# Patient Record
Sex: Female | Born: 1950 | Race: White | Hispanic: Yes | Marital: Married | Smoking: Never smoker
Health system: Southern US, Community
[De-identification: ages and names within clinical notes are randomized; demographics above are authoritative.]

## PROBLEM LIST (undated history)

## (undated) DIAGNOSIS — I700 Atherosclerosis of aorta: Secondary | ICD-10-CM

## (undated) DIAGNOSIS — T8859XA Other complications of anesthesia, initial encounter: Secondary | ICD-10-CM

## (undated) DIAGNOSIS — D126 Benign neoplasm of colon, unspecified: Secondary | ICD-10-CM

## (undated) DIAGNOSIS — G4733 Obstructive sleep apnea (adult) (pediatric): Secondary | ICD-10-CM

## (undated) DIAGNOSIS — G5600 Carpal tunnel syndrome, unspecified upper limb: Secondary | ICD-10-CM

## (undated) DIAGNOSIS — Z9889 Other specified postprocedural states: Secondary | ICD-10-CM

## (undated) DIAGNOSIS — I10 Essential (primary) hypertension: Secondary | ICD-10-CM

## (undated) DIAGNOSIS — H40053 Ocular hypertension, bilateral: Secondary | ICD-10-CM

## (undated) DIAGNOSIS — K219 Gastro-esophageal reflux disease without esophagitis: Secondary | ICD-10-CM

## (undated) DIAGNOSIS — Z9641 Presence of insulin pump (external) (internal): Secondary | ICD-10-CM

## (undated) DIAGNOSIS — IMO0001 Reserved for inherently not codable concepts without codable children: Secondary | ICD-10-CM

## (undated) DIAGNOSIS — E039 Hypothyroidism, unspecified: Secondary | ICD-10-CM

## (undated) DIAGNOSIS — H409 Unspecified glaucoma: Secondary | ICD-10-CM

## (undated) DIAGNOSIS — G459 Transient cerebral ischemic attack, unspecified: Secondary | ICD-10-CM

## (undated) DIAGNOSIS — I639 Cerebral infarction, unspecified: Secondary | ICD-10-CM

## (undated) DIAGNOSIS — E109 Type 1 diabetes mellitus without complications: Secondary | ICD-10-CM

## (undated) DIAGNOSIS — R0609 Other forms of dyspnea: Secondary | ICD-10-CM

## (undated) DIAGNOSIS — I2510 Atherosclerotic heart disease of native coronary artery without angina pectoris: Secondary | ICD-10-CM

## (undated) DIAGNOSIS — Z531 Procedure and treatment not carried out because of patient's decision for reasons of belief and group pressure: Secondary | ICD-10-CM

## (undated) DIAGNOSIS — R112 Nausea with vomiting, unspecified: Secondary | ICD-10-CM

## (undated) HISTORY — PX: BLADDER SURGERY: SHX569

## (undated) HISTORY — PX: NO PAST SURGERIES: SHX2092

## (undated) HISTORY — PX: ANAL FISSURE REPAIR: SHX2312

## (undated) HISTORY — PX: COLONOSCOPY: SHX174

---

## 2004-04-20 ENCOUNTER — Ambulatory Visit: Payer: Self-pay | Admitting: Internal Medicine

## 2004-04-21 ENCOUNTER — Ambulatory Visit: Payer: Self-pay | Admitting: Unknown Physician Specialty

## 2004-08-31 ENCOUNTER — Ambulatory Visit: Payer: Self-pay | Admitting: Internal Medicine

## 2004-11-02 ENCOUNTER — Ambulatory Visit: Payer: Self-pay | Admitting: Specialist

## 2004-11-09 ENCOUNTER — Ambulatory Visit: Payer: Self-pay | Admitting: Unknown Physician Specialty

## 2004-12-24 ENCOUNTER — Ambulatory Visit: Payer: Self-pay | Admitting: Unknown Physician Specialty

## 2005-06-05 ENCOUNTER — Other Ambulatory Visit: Payer: Self-pay

## 2005-06-12 ENCOUNTER — Ambulatory Visit: Payer: Self-pay | Admitting: Specialist

## 2005-10-01 ENCOUNTER — Ambulatory Visit: Payer: Self-pay | Admitting: Family Medicine

## 2005-10-30 ENCOUNTER — Ambulatory Visit: Payer: Self-pay | Admitting: Specialist

## 2006-05-03 ENCOUNTER — Ambulatory Visit: Payer: Self-pay | Admitting: Unknown Physician Specialty

## 2006-05-07 ENCOUNTER — Ambulatory Visit: Payer: Self-pay | Admitting: Unknown Physician Specialty

## 2006-05-25 ENCOUNTER — Ambulatory Visit: Payer: Self-pay | Admitting: Unknown Physician Specialty

## 2006-06-11 ENCOUNTER — Ambulatory Visit: Payer: Self-pay | Admitting: Internal Medicine

## 2006-10-08 ENCOUNTER — Ambulatory Visit: Payer: Self-pay | Admitting: Unknown Physician Specialty

## 2006-10-25 ENCOUNTER — Ambulatory Visit: Payer: Self-pay | Admitting: Unknown Physician Specialty

## 2006-12-20 ENCOUNTER — Ambulatory Visit: Payer: Self-pay | Admitting: Unknown Physician Specialty

## 2006-12-23 ENCOUNTER — Ambulatory Visit: Payer: Self-pay | Admitting: Nurse Practitioner

## 2006-12-25 ENCOUNTER — Ambulatory Visit: Payer: Self-pay | Admitting: Unknown Physician Specialty

## 2007-01-29 ENCOUNTER — Ambulatory Visit: Payer: Self-pay | Admitting: Unknown Physician Specialty

## 2007-02-24 ENCOUNTER — Ambulatory Visit: Payer: Self-pay | Admitting: Unknown Physician Specialty

## 2007-08-04 ENCOUNTER — Ambulatory Visit: Payer: Self-pay | Admitting: Unknown Physician Specialty

## 2007-08-25 ENCOUNTER — Ambulatory Visit: Payer: Self-pay | Admitting: Unknown Physician Specialty

## 2008-01-01 ENCOUNTER — Ambulatory Visit: Payer: Self-pay | Admitting: Family Medicine

## 2008-09-21 ENCOUNTER — Ambulatory Visit: Payer: Self-pay | Admitting: Specialist

## 2009-01-03 ENCOUNTER — Ambulatory Visit: Payer: Self-pay | Admitting: Family Medicine

## 2009-08-15 ENCOUNTER — Ambulatory Visit: Payer: Self-pay | Admitting: Unknown Physician Specialty

## 2009-12-22 ENCOUNTER — Ambulatory Visit: Payer: Self-pay | Admitting: Cardiovascular Disease

## 2010-01-05 ENCOUNTER — Ambulatory Visit: Payer: Self-pay | Admitting: Internal Medicine

## 2010-03-12 ENCOUNTER — Ambulatory Visit: Payer: Self-pay | Admitting: Internal Medicine

## 2010-05-04 ENCOUNTER — Ambulatory Visit: Payer: Self-pay | Admitting: Unknown Physician Specialty

## 2011-01-29 ENCOUNTER — Ambulatory Visit: Payer: Self-pay | Admitting: Internal Medicine

## 2011-03-02 DIAGNOSIS — E039 Hypothyroidism, unspecified: Secondary | ICD-10-CM | POA: Insufficient documentation

## 2011-12-26 ENCOUNTER — Ambulatory Visit: Payer: Self-pay | Admitting: Internal Medicine

## 2012-01-30 ENCOUNTER — Ambulatory Visit: Payer: Self-pay | Admitting: Internal Medicine

## 2012-09-16 ENCOUNTER — Ambulatory Visit: Payer: Self-pay | Admitting: Internal Medicine

## 2013-12-23 ENCOUNTER — Ambulatory Visit: Payer: Self-pay | Admitting: Internal Medicine

## 2014-01-28 ENCOUNTER — Ambulatory Visit: Payer: Self-pay

## 2014-01-28 LAB — URINALYSIS, COMPLETE
BLOOD: NEGATIVE
Bilirubin,UR: NEGATIVE
GLUCOSE, UR: NEGATIVE
KETONE: NEGATIVE
Nitrite: NEGATIVE
Ph: 7.5 (ref 5.0–8.0)
Protein: NEGATIVE
SPECIFIC GRAVITY: 1.015 (ref 1.000–1.030)

## 2014-01-30 LAB — URINE CULTURE

## 2014-08-02 DIAGNOSIS — G4733 Obstructive sleep apnea (adult) (pediatric): Secondary | ICD-10-CM | POA: Insufficient documentation

## 2014-09-29 DIAGNOSIS — Z531 Procedure and treatment not carried out because of patient's decision for reasons of belief and group pressure: Secondary | ICD-10-CM | POA: Insufficient documentation

## 2015-01-04 ENCOUNTER — Other Ambulatory Visit: Payer: Self-pay | Admitting: Internal Medicine

## 2015-01-04 ENCOUNTER — Other Ambulatory Visit: Payer: Self-pay | Admitting: Family Medicine

## 2015-01-04 DIAGNOSIS — Z1231 Encounter for screening mammogram for malignant neoplasm of breast: Secondary | ICD-10-CM

## 2015-01-06 ENCOUNTER — Ambulatory Visit
Admission: RE | Admit: 2015-01-06 | Discharge: 2015-01-06 | Disposition: A | Discharge status: Home / Self Care | Payer: Managed Care, Other (non HMO) | Source: Ambulatory Visit | Attending: Family Medicine | Admitting: Family Medicine

## 2015-01-06 ENCOUNTER — Ambulatory Visit: Payer: Self-pay

## 2015-01-06 DIAGNOSIS — Z1231 Encounter for screening mammogram for malignant neoplasm of breast: Secondary | ICD-10-CM | POA: Diagnosis not present

## 2015-05-09 ENCOUNTER — Other Ambulatory Visit: Payer: Self-pay | Admitting: Family Medicine

## 2015-05-09 DIAGNOSIS — G8929 Other chronic pain: Secondary | ICD-10-CM

## 2015-05-09 DIAGNOSIS — M546 Pain in thoracic spine: Principal | ICD-10-CM

## 2015-12-20 ENCOUNTER — Ambulatory Visit
Admission: EM | Admit: 2015-12-20 | Discharge: 2015-12-20 | Disposition: A | Discharge status: Home / Self Care | Payer: Managed Care, Other (non HMO) | Attending: Family Medicine | Admitting: Family Medicine

## 2015-12-20 DIAGNOSIS — N390 Urinary tract infection, site not specified: Secondary | ICD-10-CM

## 2015-12-20 HISTORY — DX: Essential (primary) hypertension: I10

## 2015-12-20 HISTORY — DX: Type 1 diabetes mellitus without complications: E10.9

## 2015-12-20 HISTORY — DX: Hypothyroidism, unspecified: E03.9

## 2015-12-20 LAB — URINALYSIS COMPLETE WITH MICROSCOPIC (ARMC ONLY)
BILIRUBIN URINE: NEGATIVE
Glucose, UA: 500 mg/dL — AB
KETONES UR: NEGATIVE mg/dL
NITRITE: NEGATIVE
Protein, ur: NEGATIVE mg/dL
SQUAMOUS EPITHELIAL / LPF: NONE SEEN
Specific Gravity, Urine: 1.015 (ref 1.005–1.030)
pH: 5.5 (ref 5.0–8.0)

## 2015-12-20 MED ORDER — CIPROFLOXACIN HCL 500 MG PO TABS: 500.0000 mg | ORAL_TABLET | Freq: Two times a day (BID) | ORAL | 0 refills | Status: DC

## 2015-12-20 NOTE — ED Triage Notes (Signed)
Patient reports that she had a UTI on 11/28/2015 and was treated with Macrobid. Patient reports that she had improved but worsened again 2 days ago. Reports that her culture grew out E. Coli in her urine. Patient states that she usually responds well to Cipro.

## 2015-12-20 NOTE — ED Provider Notes (Signed)
[facility]    CSN: [id number] Arrival date & time: 12/20/15  0946     History   Chief Complaint Chief Complaint  Patient presents with  . Urinary Tract Infection    HPI Cheyenne Lopez is a 65 y.o. female.   The history is provided by the patient.  Dysuria  Pain quality:  Burning Pain severity:  Moderate Onset quality:  Sudden Duration:  2 days Timing:  Constant Progression:  Worsening Chronicity:  New Recent urinary tract infections: yes   Relieved by:  None tried Ineffective treatments:  None tried Urinary symptoms: frequent urination   Urinary symptoms: no discolored urine, no foul-smelling urine, no hematuria, no hesitancy and no bladder incontinence   Associated symptoms: no abdominal pain, no fever, no flank pain, no genital lesions, no nausea, no vaginal discharge and no vomiting   Risk factors: recurrent urinary tract infections   Risk factors: no hx of pyelonephritis, no hx of urolithiasis, no kidney transplant, not pregnant, no renal cysts, no renal disease, no sexually transmitted infections, no single kidney and no urinary catheter     Past Medical History:  Diagnosis Date  . Hypertension   . Hypothyroid   . Type 1 diabetes ([place])     There are no active problems to display for this patient.   Past Surgical History:  Procedure Laterality Date  . BLADDER SURGERY    . NO PAST SURGERIES      OB History    No data available       Home Medications    Prior to Admission medications   Medication Sig Start Date End Date Taking? Authorizing Provider  aspirin EC 81 MG tablet Take 81 mg by mouth daily.   Yes Historical Provider, MD  Insulin Human (INSULIN PUMP) SOLN Inject into the skin.   Yes Historical Provider, MD  insulin lispro (HUMALOG) 100 UNIT/ML injection Inject into the skin 3 (three) times daily before meals.   Yes Historical Provider, MD  latanoprost (XALATAN) 0.005 % ophthalmic solution 1 drop at bedtime.   Yes Historical  Provider, MD  losartan (COZAAR) 25 MG tablet Take 25 mg by mouth daily.   Yes Historical Provider, MD  thyroid (ARMOUR) 65 MG tablet Take 65 mg by mouth daily.   Yes Historical Provider, MD  ciprofloxacin (CIPRO) 500 MG tablet Take 1 tablet (500 mg total) by mouth every 12 (twelve) hours. 12/20/15   Norval Gable, MD    Family History History reviewed. No pertinent family history.  Social History Social History  Substance Use Topics  . Smoking status: Never Smoker  . Smokeless tobacco: Never Used  . Alcohol use No     Allergies   Review of patient's allergies indicates no known allergies.   Review of Systems Review of Systems  Constitutional: Negative for fever.  Gastrointestinal: Negative for abdominal pain, nausea and vomiting.  Genitourinary: Positive for dysuria. Negative for flank pain and vaginal discharge.     Physical Exam Triage Vital Signs ED Triage Vitals  Enc Vitals Group     BP 12/20/15 1111 (!) 124/55     Pulse Rate 12/20/15 1111 (!) 51     Resp 12/20/15 1111 16     Temp 12/20/15 1111 97.8 F (36.6 C)     Temp Source 12/20/15 1111 Tympanic     SpO2 12/20/15 1111 100 %     Weight 12/20/15 1113 155 lb (70.3 kg)     Height 12/20/15 1113 5\' 1"  (1.549 m)  Head Circumference --      Peak Flow --      Pain Score 12/20/15 1118 7     Pain Loc --      Pain Edu? --      Excl. in [place]? --    No data found.   Updated Vital Signs BP (!) 124/55 (BP Location: Left Arm)   Pulse (!) 51   Temp 97.8 F (36.6 C) (Tympanic)   Resp 16   Ht 5\' 1"  (1.549 m)   Wt 155 lb (70.3 kg)   SpO2 100%   BMI 29.29 kg/m   Visual Acuity Right Eye Distance:   Left Eye Distance:   Bilateral Distance:    Right Eye Near:   Left Eye Near:    Bilateral Near:     Physical Exam  Constitutional: She appears well-developed and well-nourished. No distress.  Abdominal: Soft. Bowel sounds are normal. She exhibits no distension and no mass. There is tenderness (mild suprapubic).  There is no rebound and no guarding.  Skin: She is not diaphoretic.  Nursing note and vitals reviewed.    UC Treatments / Results  Labs (all labs ordered are listed, but only abnormal results are displayed) Labs Reviewed  URINALYSIS COMPLETEWITH MICROSCOPIC ([place]) - Abnormal; Notable for the following:       Result Value   APPearance HAZY (*)    Glucose, UA 500 (*)    Hgb urine dipstick MODERATE (*)    Leukocytes, UA LARGE (*)    Bacteria, UA MANY (*)    All other components within normal limits    EKG  EKG Interpretation None       Radiology No results found.  Procedures Procedures (including critical care time)  Medications Ordered in UC Medications - No data to display   Initial Impression / Assessment and Plan / UC Course  I have reviewed the triage vital signs and the nursing notes.  Pertinent labs & imaging results that were available during my care of the patient were reviewed by me and considered in my medical decision making (see chart for details).  Clinical Course      Final Clinical Impressions(s) / UC Diagnoses   Final diagnoses:  UTI (lower urinary tract infection)    New Prescriptions Discharge Medication List as of 12/20/2015 11:43 AM    START taking these medications   Details  ciprofloxacin (CIPRO) 500 MG tablet Take 1 tablet (500 mg total) by mouth every 12 (twelve) hours., Starting Tue 12/20/2015, Normal       1. Lab results and diagnosis reviewed with patient 2. rx as per orders above; reviewed possible side effects, interactions, risks and benefits  3. Recommend supportive treatment with increased water 4. Follow-up prn if symptoms worsen or don't improve   Norval Gable, MD 12/20/15 1202

## 2015-12-24 ENCOUNTER — Telehealth: Payer: Self-pay

## 2015-12-24 NOTE — Telephone Encounter (Signed)
Courtesy call back completed today after patient's visit at [facility]. Patient improved and will call back with any questions or concerns.  

## 2016-01-31 ENCOUNTER — Other Ambulatory Visit: Payer: Self-pay | Admitting: Family Medicine

## 2016-01-31 DIAGNOSIS — Z1231 Encounter for screening mammogram for malignant neoplasm of breast: Secondary | ICD-10-CM

## 2016-02-14 ENCOUNTER — Ambulatory Visit
Admission: RE | Admit: 2016-02-14 | Discharge: 2016-02-14 | Disposition: A | Discharge status: Home / Self Care | Payer: Managed Care, Other (non HMO) | Source: Ambulatory Visit | Attending: Family Medicine | Admitting: Family Medicine

## 2016-02-14 DIAGNOSIS — Z1231 Encounter for screening mammogram for malignant neoplasm of breast: Secondary | ICD-10-CM | POA: Diagnosis not present

## 2016-05-01 DIAGNOSIS — M7710 Lateral epicondylitis, unspecified elbow: Secondary | ICD-10-CM | POA: Insufficient documentation

## 2016-08-16 ENCOUNTER — Encounter: Payer: Self-pay | Admitting: Emergency Medicine

## 2016-08-16 ENCOUNTER — Ambulatory Visit
Admission: EM | Admit: 2016-08-16 | Discharge: 2016-08-16 | Disposition: A | Discharge status: Home / Self Care | Payer: 59 | Attending: Emergency Medicine | Admitting: Emergency Medicine

## 2016-08-16 DIAGNOSIS — N3000 Acute cystitis without hematuria: Secondary | ICD-10-CM | POA: Diagnosis not present

## 2016-08-16 LAB — URINALYSIS, COMPLETE (UACMP) WITH MICROSCOPIC
Bilirubin Urine: NEGATIVE
GLUCOSE, UA: 100 mg/dL — AB
Ketones, ur: NEGATIVE mg/dL
Nitrite: NEGATIVE
PH: 5.5 (ref 5.0–8.0)

## 2016-08-16 MED ORDER — CIPROFLOXACIN HCL 500 MG PO TABS: 500.0000 mg | ORAL_TABLET | Freq: Two times a day (BID) | ORAL | 0 refills | Status: DC

## 2016-08-16 NOTE — ED Triage Notes (Signed)
Burning and frequent urination started this morning

## 2016-08-16 NOTE — ED Provider Notes (Signed)
CSN: [id number]     Arrival date & time 08/16/16  1936 History   None    Chief Complaint  Patient presents with  . Urinary Frequency   (Consider location/radiation/quality/duration/timing/severity/associated sxs/prior Treatment) HPI Patient presents today for evaluation of a 1 day history of burning with urination.  PMH involved DM1 with insulin pump and history of UTI 6 months ago.  Reports irritation and burning with urination today.  Denies any bloody urine or discharge.  No change in sexual partners.  No fevers at home, no abdominal pain.  Mild right sided low back pain.  Was treated with Cipro 6 months ago with complete resolution of symptoms. Past Medical History:  Diagnosis Date  . Hypertension   . Hypothyroid   . Type 1 diabetes [facility]    Past Surgical History:  Procedure Laterality Date  . BLADDER SURGERY    . NO PAST SURGERIES     Family History  Problem Relation Age of Onset  . Breast cancer Neg Hx    Social History  Substance Use Topics  . Smoking status: Never Smoker  . Smokeless tobacco: Never Used  . Alcohol use No   OB History    No data available     Review of Systems  Constitutional: Positive for fatigue. Negative for fever.  HENT: Negative.   Eyes: Negative.   Respiratory: Negative.   Cardiovascular: Negative.   Gastrointestinal: Negative for abdominal pain.  Endocrine: Negative.   Genitourinary: Positive for dysuria, flank pain and frequency.  Musculoskeletal: Negative for arthralgias, back pain, gait problem, joint swelling, myalgias, neck pain and neck stiffness.  Skin: Negative.   Allergic/Immunologic: Negative.   Neurological: Negative.   Hematological: Negative.   Psychiatric/Behavioral: Negative.     Allergies  Patient has no known allergies.  Home Medications   Prior to Admission medications   Medication Sig Start Date End Date Taking? Authorizing Provider  aspirin EC 81 MG tablet Take 81 mg by mouth daily.   Yes [provider]  Insulin Human (INSULIN PUMP) SOLN Inject into the skin.   Yes [provider]  insulin lispro (HUMALOG) 100 UNIT/ML injection Inject into the skin 3 (three) times daily before meals.   Yes [provider]  latanoprost (XALATAN) 0.005 % ophthalmic solution 1 drop at bedtime.   Yes [provider]  losartan (COZAAR) 25 MG tablet Take 25 mg by mouth daily.   Yes [provider]  thyroid (ARMOUR) 65 MG tablet Take 65 mg by mouth daily.   Yes [provider]  ciprofloxacin (CIPRO) 500 MG tablet Take 1 tablet (500 mg total) by mouth every 12 (twelve) hours. 08/16/16   Lattie Corns, PA-C   Meds Ordered and Administered this Visit  Medications - No data to display  BP 129/62 (BP Location: Right Arm)   Pulse 66   Temp 97.9 F (36.6 C) (Oral)   Resp 16   Ht 5' (1.524 m)   Wt 150 lb (68 kg)   SpO2 99%   BMI 29.29 kg/m  No data found.  Physical Exam  Constitutional: She appears well-developed and well-nourished.  Cardiovascular: Normal rate and regular rhythm.   Pulmonary/Chest: Effort normal and breath sounds normal. No respiratory distress. She has no wheezes. She has no rales. She exhibits no tenderness.  Abdominal: Soft. Bowel sounds are normal. She exhibits no distension. There is no tenderness.  Genitourinary:  Genitourinary Comments: Mild CVA tenderness on right lumbar spine region    Urgent Care  Course     Procedures (including critical care time)  Labs Review Labs Reviewed  URINALYSIS, COMPLETE (UACMP) WITH MICROSCOPIC - Abnormal; Notable for the following:       Result Value   APPearance CLOUDY (*)    Specific Gravity, Urine >1.030 (*)    Glucose, UA 100 (*)    Hgb urine dipstick LARGE (*)    Protein, ur TRACE (*)    Leukocytes, UA MODERATE (*)    Squamous Epithelial / LPF 0-5 (*)    Bacteria, UA MANY (*)    All other components within normal limits  URINE CULTURE   Imaging Review No results  found.  MDM   1. Acute cystitis without hematuria   -  Treatment options were discussed today with the patient. -  Will place on Cipro x 7 days.  Urine Cx ordered. -  Symptomatic treatment encouraged. -  Follow-up with PCP or [facility] if symptoms fail to improve or worsen.    Lattie Corns, PA-C 08/16/16 2015

## 2016-08-19 LAB — URINE CULTURE: Culture: >=100000 — AB

## 2017-01-09 ENCOUNTER — Other Ambulatory Visit: Payer: Self-pay | Admitting: Family Medicine

## 2017-01-22 ENCOUNTER — Other Ambulatory Visit: Payer: Self-pay | Admitting: Family Medicine

## 2017-01-22 DIAGNOSIS — Z1239 Encounter for other screening for malignant neoplasm of breast: Secondary | ICD-10-CM

## 2017-01-24 ENCOUNTER — Other Ambulatory Visit: Payer: Self-pay | Admitting: Family Medicine

## 2017-01-24 DIAGNOSIS — Z780 Asymptomatic menopausal state: Secondary | ICD-10-CM

## 2017-02-12 ENCOUNTER — Ambulatory Visit
Admission: RE | Admit: 2017-02-12 | Discharge: 2017-02-12 | Disposition: A | Discharge status: Home / Self Care | Payer: 59 | Source: Ambulatory Visit | Attending: Family Medicine | Admitting: Family Medicine

## 2017-02-12 DIAGNOSIS — Z1231 Encounter for screening mammogram for malignant neoplasm of breast: Secondary | ICD-10-CM | POA: Diagnosis not present

## 2017-02-12 DIAGNOSIS — Z1239 Encounter for other screening for malignant neoplasm of breast: Secondary | ICD-10-CM

## 2017-11-03 ENCOUNTER — Other Ambulatory Visit: Payer: Self-pay

## 2017-11-03 ENCOUNTER — Ambulatory Visit
Admission: EM | Admit: 2017-11-03 | Discharge: 2017-11-03 | Disposition: A | Discharge status: Home / Self Care | Payer: Medicare Other | Attending: Family Medicine | Admitting: Family Medicine

## 2017-11-03 DIAGNOSIS — R300 Dysuria: Secondary | ICD-10-CM

## 2017-11-03 DIAGNOSIS — N3000 Acute cystitis without hematuria: Secondary | ICD-10-CM

## 2017-11-03 DIAGNOSIS — R350 Frequency of micturition: Secondary | ICD-10-CM | POA: Diagnosis not present

## 2017-11-03 LAB — URINALYSIS, COMPLETE (UACMP) WITH MICROSCOPIC
BILIRUBIN URINE: NEGATIVE
Bacteria, UA: NONE SEEN
Glucose, UA: NEGATIVE mg/dL
HGB URINE DIPSTICK: NEGATIVE
Ketones, ur: NEGATIVE mg/dL
NITRITE: NEGATIVE
PROTEIN: NEGATIVE mg/dL
RBC / HPF: NONE SEEN RBC/hpf (ref 0–5)
Specific Gravity, Urine: 1.015 (ref 1.005–1.030)
pH: 7.5 (ref 5.0–8.0)

## 2017-11-03 MED ORDER — CIPROFLOXACIN HCL 500 MG PO TABS: 500.0000 mg | ORAL_TABLET | Freq: Two times a day (BID) | ORAL | 0 refills | Status: DC

## 2017-11-03 NOTE — ED Triage Notes (Signed)
Pt with urinary frequency starting early in the week, having some right flank pain and urinary hesitency.

## 2017-11-03 NOTE — ED Provider Notes (Signed)
[facility]    CSN: [id number] Arrival date & time: 11/03/17  1253     History   Chief Complaint Chief Complaint  Patient presents with  . Urinary Frequency    HPI Cheyenne Lopez is a 67 y.o. female.   67 yo female with a c/o urinary frequency and dysuria for one week. States she's been treating it at home with cranberry pills and increased water intake and has relieved some, however she started having right flank pain x 2 days. Denies any fevers, chills, vomiting.   The history is provided by the patient.  Urinary Frequency     Past Medical History:  Diagnosis Date  . Hypertension   . Hypothyroid   . Type 1 diabetes ([place])     There are no active problems to display for this patient.   Past Surgical History:  Procedure Laterality Date  . BLADDER SURGERY    . NO PAST SURGERIES      OB History   None      Home Medications    Prior to Admission medications   Medication Sig Start Date End Date Taking? Authorizing Provider  insulin glargine (LANTUS) 100 UNIT/ML injection Lantus U-100 Insulin 100 unit/mL subcutaneous solution 09/28/15  Yes [provider]  aspirin EC 81 MG tablet Take 81 mg by mouth daily.    [provider]  ciprofloxacin (CIPRO) 500 MG tablet Take 1 tablet (500 mg total) by mouth every 12 (twelve) hours. 11/03/17   Norval Gable, MD  Insulin Human (INSULIN PUMP) SOLN Inject into the skin.    [provider]  insulin lispro (HUMALOG) 100 UNIT/ML injection Inject into the skin 3 (three) times daily before meals.    [provider]  latanoprost (XALATAN) 0.005 % ophthalmic solution 1 drop at bedtime.    [provider]  losartan (COZAAR) 25 MG tablet Take 25 mg by mouth daily.    [provider]  ranitidine (ZANTAC) 300 MG tablet  08/22/17   [provider]  thyroid (ARMOUR) 65 MG tablet Take 65 mg by mouth daily.    [provider]  valsartan (DIOVAN) 40 MG  tablet Take 20 mg by mouth daily.  10/27/17   [provider]    Family History Family History  Problem Relation Age of Onset  . Breast cancer Neg Hx     Social History Social History   Tobacco Use  . Smoking status: Never Smoker  . Smokeless tobacco: Never Used  Substance Use Topics  . Alcohol use: No  . Drug use: No     Allergies   Patient has no known allergies.   Review of Systems Review of Systems  Genitourinary: Positive for frequency.     Physical Exam Triage Vital Signs ED Triage Vitals  Enc Vitals Group     BP 11/03/17 1308 (!) 134/57     Pulse Rate 11/03/17 1308 68     Resp 11/03/17 1308 16     Temp 11/03/17 1308 98.1 F (36.7 C)     Temp Source 11/03/17 1308 Oral     SpO2 11/03/17 1308 100 %     Weight 11/03/17 1305 146 lb (66.2 kg)     Height 11/03/17 1305 5' (1.524 m)     Head Circumference --      Peak Flow --      Pain Score 11/03/17 1305 6     Pain Loc --      Pain Edu? --  Excl. in [place]? --    No data found.  Updated Vital Signs BP (!) 134/57 (BP Location: Left Arm)   Pulse 68   Temp 98.1 F (36.7 C) (Oral)   Resp 16   Ht 5' (1.524 m)   Wt 66.2 kg   SpO2 100%   BMI 28.51 kg/m   Visual Acuity Right Eye Distance:   Left Eye Distance:   Bilateral Distance:    Right Eye Near:   Left Eye Near:    Bilateral Near:     Physical Exam  Constitutional: She appears well-developed and well-nourished. No distress.  Abdominal: Soft. Bowel sounds are normal. She exhibits no distension and no mass. There is tenderness (mild, right flank). There is no rebound and no guarding.  Skin: She is not diaphoretic.  Nursing note and vitals reviewed.    UC Treatments / Results  Labs (all labs ordered are listed, but only abnormal results are displayed) Labs Reviewed  URINALYSIS, COMPLETE (UACMP) WITH MICROSCOPIC - Abnormal; Notable for the following components:      Result Value   Leukocytes, UA TRACE (*)    All other components  within normal limits  URINE CULTURE    EKG None  Radiology No results found.  Procedures Procedures (including critical care time)  Medications Ordered in UC Medications - No data to display  Initial Impression / Assessment and Plan / UC Course  I have reviewed the triage vital signs and the nursing notes.  Pertinent labs & imaging results that were available during my care of the patient were reviewed by me and considered in my medical decision making (see chart for details).      Final Clinical Impressions(s) / UC Diagnoses   Final diagnoses:  Acute cystitis without hematuria   Discharge Instructions   None    ED Prescriptions    Medication Sig Dispense Auth. Provider   ciprofloxacin (CIPRO) 500 MG tablet Take 1 tablet (500 mg total) by mouth every 12 (twelve) hours. 10 tablet Norval Gable, MD     1. Lab results and diagnosis reviewed with patient 2. rx as per orders above; reviewed possible side effects, interactions, risks and benefits  3. Recommend supportive treatment with increased fluids 4. Follow-up prn if symptoms worsen or don't improve   Controlled Substance Prescriptions [place] Controlled Substance Registry consulted? Not Applicable   Norval Gable, MD 11/03/17 1425

## 2017-11-05 LAB — URINE CULTURE
Culture: NO GROWTH
SPECIAL REQUESTS: NORMAL

## 2018-01-06 ENCOUNTER — Other Ambulatory Visit: Payer: Self-pay | Admitting: Family Medicine

## 2018-01-06 DIAGNOSIS — Z1231 Encounter for screening mammogram for malignant neoplasm of breast: Secondary | ICD-10-CM

## 2018-02-25 ENCOUNTER — Ambulatory Visit
Admission: RE | Admit: 2018-02-25 | Discharge: 2018-02-25 | Disposition: A | Discharge status: Home / Self Care | Payer: 59 | Source: Ambulatory Visit | Attending: Family Medicine | Admitting: Family Medicine

## 2018-02-25 ENCOUNTER — Encounter (INDEPENDENT_AMBULATORY_CARE_PROVIDER_SITE_OTHER): Payer: Self-pay

## 2018-02-25 DIAGNOSIS — Z1231 Encounter for screening mammogram for malignant neoplasm of breast: Secondary | ICD-10-CM | POA: Insufficient documentation

## 2019-02-07 ENCOUNTER — Other Ambulatory Visit: Payer: Self-pay

## 2019-02-07 ENCOUNTER — Encounter: Payer: Self-pay | Admitting: Emergency Medicine

## 2019-02-07 ENCOUNTER — Ambulatory Visit
Admission: EM | Admit: 2019-02-07 | Discharge: 2019-02-07 | Disposition: A | Discharge status: Home / Self Care | Payer: 59 | Attending: Emergency Medicine | Admitting: Emergency Medicine

## 2019-02-07 ENCOUNTER — Ambulatory Visit (INDEPENDENT_AMBULATORY_CARE_PROVIDER_SITE_OTHER): Payer: 59

## 2019-02-07 DIAGNOSIS — M7061 Trochanteric bursitis, right hip: Secondary | ICD-10-CM

## 2019-02-07 MED ORDER — KETOROLAC TROMETHAMINE 60 MG/2ML IM SOLN
30.0000 mg | Freq: Once | INTRAMUSCULAR | Status: AC
  Administered 2019-02-07: 30 mg via INTRAMUSCULAR

## 2019-02-07 MED ORDER — MELOXICAM 7.5 MG PO TABS: 7.5000 mg | ORAL_TABLET | Freq: Every day | ORAL | 0 refills | Status: DC

## 2019-02-07 NOTE — ED Triage Notes (Signed)
Patient c/o pain that starts in her upper part of her right leg and runs down her right leg since Thursday.  Patient denies any injury or fall.

## 2019-02-07 NOTE — ED Provider Notes (Signed)
[facility]    CSN: [id number] Arrival date & time: 02/07/19  1454      History   Chief Complaint Chief Complaint  Patient presents with  . Leg Pain    right    HPI Cheyenne Lopez is a 68 y.o. female.   HPI  68 year old female type I diabetic Presents with a right-sided leg pain that started suddenly on Thursday 2 days prior to this visit.  States that anytime she stands or puts weight on that leg that it hurts severely.  She states that the pain is felt mostly in the lateral hip at the greater trochanter particularly posteriorly and will radiate into her right leg to the level of her ankle.  She has been using a cane for ambulation assistance.  She has no back pain.  She does not remember any injury to her leg at all.  Sitting and standing are very painful.        Past Medical History:  Diagnosis Date  . Hypertension   . Hypothyroid   . Type 1 diabetes ([place])     There are no active problems to display for this patient.   Past Surgical History:  Procedure Laterality Date  . BLADDER SURGERY    . NO PAST SURGERIES      OB History   No obstetric history on file.      Home Medications    Prior to Admission medications   Medication Sig Start Date End Date Taking? Authorizing Provider  aspirin EC 81 MG tablet Take 81 mg by mouth daily.   Yes [provider]  Insulin Human (INSULIN PUMP) SOLN Inject into the skin.   Yes [provider]  insulin lispro (HUMALOG) 100 UNIT/ML injection Inject into the skin 3 (three) times daily before meals.   Yes [provider]  thyroid (ARMOUR) 65 MG tablet Take 65 mg by mouth daily.   Yes [provider]  valsartan (DIOVAN) 40 MG tablet Take 20 mg by mouth daily.  10/27/17  Yes [provider]  insulin glargine (LANTUS) 100 UNIT/ML injection Lantus U-100 Insulin 100 unit/mL subcutaneous solution 09/28/15   [provider]  latanoprost (XALATAN) 0.005 %  ophthalmic solution 1 drop at bedtime.    [provider]  meloxicam (MOBIC) 7.5 MG tablet Take 1 tablet (7.5 mg total) by mouth daily. 02/07/19   Lorin Picket, PA-C  losartan (COZAAR) 25 MG tablet Take 25 mg by mouth daily.  02/07/19  [provider]  ranitidine (ZANTAC) 300 MG tablet  08/22/17 02/07/19  [provider]    Family History Family History  Problem Relation Age of Onset  . Breast cancer Neg Hx     Social History Social History   Tobacco Use  . Smoking status: Never Smoker  . Smokeless tobacco: Never Used  Substance Use Topics  . Alcohol use: No  . Drug use: No     Allergies   Patient has no known allergies.   Review of Systems Review of Systems  Constitutional: Positive for activity change. Negative for appetite change, chills and fatigue.  Musculoskeletal: Positive for gait problem and myalgias.  All other systems reviewed and are negative.    Physical Exam Triage Vital Signs ED Triage Vitals  Enc Vitals Group     BP 02/07/19 1518 (!) 145/65     Pulse Rate 02/07/19 1518 88     Resp 02/07/19 1518 16     Temp 02/07/19 1518 99.8  F (37.7 C)     Temp Source 02/07/19 1518 Oral     SpO2 02/07/19 1518 100 %     Weight 02/07/19 1517 153 lb (69.4 kg)     Height 02/07/19 1517 5\' 1"  (1.549 m)     Head Circumference --      Peak Flow --      Pain Score 02/07/19 1517 9     Pain Loc --      Pain Edu? --      Excl. in [place]? --    No data found.  Updated Vital Signs BP (!) 145/65 (BP Location: Left Arm)   Pulse 88   Temp 99.8 F (37.7 C) (Oral)   Resp 16   Ht 5\' 1"  (1.549 m)   Wt 153 lb (69.4 kg)   SpO2 100%   BMI 28.91 kg/m   Visual Acuity Right Eye Distance:   Left Eye Distance:   Bilateral Distance:    Right Eye Near:   Left Eye Near:    Bilateral Near:     Physical Exam Vitals signs and nursing note reviewed.  Constitutional:      General: She is not in acute distress.    Appearance: Normal appearance.  She is normal weight. She is not ill-appearing, toxic-appearing or diaphoretic.  HENT:     Head: Normocephalic and atraumatic.     Mouth/Throat:     Mouth: Mucous membranes are moist.  Eyes:     Conjunctiva/sclera: Conjunctivae normal.  Neck:     Musculoskeletal: Normal range of motion and neck supple.  Cardiovascular:     Rate and Rhythm: Normal rate and regular rhythm.     Heart sounds: Normal heart sounds.  Pulmonary:     Effort: Pulmonary effort is normal.     Breath sounds: Normal breath sounds.  Musculoskeletal:        General: Tenderness present. No swelling or deformity.     Comments: Patient sitting in wheelchair.  She is able to stand with very limited weight placed on the right lower extremity.  Examination lumbar spine shows no tenderness.  She has no tenderness over the sacroiliac joint.  Internal and external rotation of the hip causes her to have pain.  Knee examination is negative.  Neurovascular function distally is intact.  Some tenderness is sharply localized over the right greater trochanter particularly superior and posterior.  This reproduces her symptoms  Skin:    General: Skin is warm and dry.  Neurological:     General: No focal deficit present.     Mental Status: She is alert and oriented to person, place, and time.  Psychiatric:        Mood and Affect: Mood normal.        Behavior: Behavior normal.        Thought Content: Thought content normal.        Judgment: Judgment normal.      UC Treatments / Results  Labs (all labs ordered are listed, but only abnormal results are displayed) Labs Reviewed - No data to display  EKG   Radiology Dg Pelvis 1-2 Views  Result Date: 02/07/2019 CLINICAL DATA:  Acute on chronic right hip pain EXAM: PELVIS - 1-2 VIEW COMPARISON:  None. FINDINGS: SI joints are non widened. Sclerosis and bony prominence of the pubic symphysis. Screw tips within the bilateral superior pubic rami. No fracture or malalignment. Mild  arthritis of both hips. IMPRESSION: 1. No acute osseous abnormality.  Mild arthritis of  the hips 2. Postsurgical changes of the bilateral superior pubic rami. Electronically Signed   By: Donavan Foil M.D.   On: 02/07/2019 16:25    Procedures Procedures (including critical care time)  Medications Ordered in UC Medications  ketorolac (TORADOL) injection 30 mg (30 mg Intramuscular Given 02/07/19 1704)    Initial Impression / Assessment and Plan / UC Course  I have reviewed the triage vital signs and the nursing notes.  Pertinent labs & imaging results that were available during my care of the patient were reviewed by me and considered in my medical decision making (see chart for details).   I reviewed the x-rays with the patient.  Mild degenerative changes of the hip.  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  This may require injections.  We will try to help her with nonsteroidal anti-inflammatory medications.  I provided her with an injection of Toradol 30 mg intramuscular today.  Prescribed Mobic 7 and half milligrams daily with food.  I have asked her to use ice on the area 20 minutes out of every 2 hours 4-5 times daily.  He should obtain a walker to give her better stability with ambulation.  She should schedule an appointment with orthopedics next week.   Final Clinical Impressions(s) / UC Diagnoses   Final diagnoses:  Greater trochanteric bursitis of right hip     Discharge Instructions     Apply ice 20 minutes out of every 2 hours 4-5 times daily for comfort.  Arrange an appointment with orthopedics at [facility] for this next week.  Recommend use of a walker to give you better stability with ambulation.  Avoid all pressure over the outside of your right hip.    ED Prescriptions    Medication Sig Dispense Auth. Provider   meloxicam (MOBIC) 7.5 MG tablet Take 1 tablet (7.5 mg total) by mouth daily. 30 tablet Lorin Picket, PA-C     PDMP not  reviewed this encounter.   Lorin Picket, PA-C 02/07/19 1717

## 2019-02-07 NOTE — Discharge Instructions (Signed)
Apply ice 20 minutes out of every 2 hours 4-5 times daily for comfort.  Arrange an appointment with orthopedics at [facility] for this next week.  Recommend use of a walker to give you better stability with ambulation.  Avoid all pressure over the outside of your right hip.

## 2019-02-12 ENCOUNTER — Other Ambulatory Visit: Payer: Self-pay | Admitting: Family Medicine

## 2019-03-03 ENCOUNTER — Other Ambulatory Visit: Payer: Self-pay | Admitting: Family Medicine

## 2019-03-03 DIAGNOSIS — Z1231 Encounter for screening mammogram for malignant neoplasm of breast: Secondary | ICD-10-CM

## 2019-03-04 ENCOUNTER — Other Ambulatory Visit: Payer: Self-pay | Admitting: Family Medicine

## 2019-03-04 DIAGNOSIS — Z780 Asymptomatic menopausal state: Secondary | ICD-10-CM

## 2019-03-11 ENCOUNTER — Ambulatory Visit: Payer: 59

## 2019-03-23 ENCOUNTER — Ambulatory Visit
Admission: RE | Admit: 2019-03-23 | Discharge: 2019-03-23 | Disposition: A | Discharge status: Home / Self Care | Payer: Managed Care, Other (non HMO) | Source: Ambulatory Visit | Attending: Family Medicine | Admitting: Family Medicine

## 2019-03-23 ENCOUNTER — Other Ambulatory Visit: Payer: Self-pay

## 2019-03-23 DIAGNOSIS — Z1231 Encounter for screening mammogram for malignant neoplasm of breast: Secondary | ICD-10-CM | POA: Insufficient documentation

## 2019-03-27 DIAGNOSIS — I214 Non-ST elevation (NSTEMI) myocardial infarction: Secondary | ICD-10-CM

## 2019-03-27 HISTORY — DX: Non-ST elevation (NSTEMI) myocardial infarction: I21.4

## 2019-06-23 ENCOUNTER — Emergency Department: Payer: 59

## 2019-06-23 ENCOUNTER — Emergency Department
Admission: EM | Admit: 2019-06-23 | Discharge: 2019-06-23 | Disposition: A | Discharge status: Home / Self Care | Payer: 59 | Attending: Emergency Medicine | Admitting: Emergency Medicine

## 2019-06-23 ENCOUNTER — Encounter: Payer: Self-pay | Admitting: Emergency Medicine

## 2019-06-23 ENCOUNTER — Other Ambulatory Visit: Payer: Self-pay

## 2019-06-23 DIAGNOSIS — E109 Type 1 diabetes mellitus without complications: Secondary | ICD-10-CM | POA: Diagnosis not present

## 2019-06-23 DIAGNOSIS — R109 Unspecified abdominal pain: Secondary | ICD-10-CM | POA: Diagnosis present

## 2019-06-23 DIAGNOSIS — I10 Essential (primary) hypertension: Secondary | ICD-10-CM | POA: Insufficient documentation

## 2019-06-23 DIAGNOSIS — E039 Hypothyroidism, unspecified: Secondary | ICD-10-CM | POA: Insufficient documentation

## 2019-06-23 DIAGNOSIS — N12 Tubulo-interstitial nephritis, not specified as acute or chronic: Secondary | ICD-10-CM | POA: Diagnosis not present

## 2019-06-23 LAB — BASIC METABOLIC PANEL
Anion gap: 10 (ref 5–15)
BUN: 11 mg/dL (ref 8–23)
CO2: 26 mmol/L (ref 22–32)
Calcium: 9.5 mg/dL (ref 8.9–10.3)
Chloride: 104 mmol/L (ref 98–111)
Creatinine, Ser: 0.69 mg/dL (ref 0.44–1.00)
GFR calc Af Amer: >60 mL/min (ref >60)
GFR calc non Af Amer: >60 mL/min (ref >60)
Glucose, Bld: 164 mg/dL — ABNORMAL HIGH (ref 70–99)
Potassium: 4.4 mmol/L (ref 3.5–5.1)
Sodium: 140 mmol/L (ref 135–145)

## 2019-06-23 LAB — URINALYSIS, COMPLETE (UACMP) WITH MICROSCOPIC
Bilirubin Urine: NEGATIVE
Glucose, UA: NEGATIVE mg/dL
Hgb urine dipstick: NEGATIVE
Ketones, ur: 5 mg/dL — AB
Nitrite: NEGATIVE
Protein, ur: NEGATIVE mg/dL
Specific Gravity, Urine: 1.003 — ABNORMAL LOW (ref 1.005–1.030)
pH: 7 (ref 5.0–8.0)

## 2019-06-23 LAB — CBC
HCT: 37.3 % (ref 36.0–46.0)
Hemoglobin: 12.4 g/dL (ref 12.0–15.0)
MCH: 32.4 pg (ref 26.0–34.0)
MCHC: 33.2 g/dL (ref 30.0–36.0)
MCV: 97.4 fL (ref 80.0–100.0)
Platelets: 191 10*3/uL (ref 150–400)
RBC: 3.83 MIL/uL — ABNORMAL LOW (ref 3.87–5.11)
RDW: 12.4 % (ref 11.5–15.5)
WBC: 8.5 10*3/uL (ref 4.0–10.5)
nRBC: 0 % (ref 0.0–0.2)

## 2019-06-23 LAB — LIPASE, BLOOD: Lipase: 21 U/L (ref 11–51)

## 2019-06-23 LAB — GLUCOSE, CAPILLARY: Glucose-Capillary: 143 mg/dL — ABNORMAL HIGH (ref 70–99)

## 2019-06-23 MED ORDER — MORPHINE SULFATE (PF) 4 MG/ML IV SOLN
4.0000 mg | Freq: Once | INTRAVENOUS | Status: AC
  Administered 2019-06-23: 4 mg via INTRAVENOUS
  Filled 2019-06-23: qty 1

## 2019-06-23 MED ORDER — OXYCODONE-ACETAMINOPHEN 5-325 MG PO TABS: 1.0000 | ORAL_TABLET | Freq: Three times a day (TID) | ORAL | 0 refills | Status: DC | PRN

## 2019-06-23 MED ORDER — CIPROFLOXACIN HCL 500 MG PO TABS: 500.0000 mg | ORAL_TABLET | Freq: Two times a day (BID) | ORAL | 0 refills | Status: AC

## 2019-06-23 MED ORDER — DOCUSATE SODIUM 100 MG PO CAPS: 100.0000 mg | ORAL_CAPSULE | Freq: Every day | ORAL | 2 refills | Status: DC | PRN

## 2019-06-23 MED ORDER — CIPROFLOXACIN HCL 500 MG PO TABS: 500.0000 mg | ORAL_TABLET | Freq: Two times a day (BID) | ORAL | 0 refills | Status: DC

## 2019-06-23 MED ORDER — SODIUM CHLORIDE 0.9 % IV SOLN
1.0000 g | Freq: Once | INTRAVENOUS | Status: AC
  Administered 2019-06-23: 1 g via INTRAVENOUS
  Filled 2019-06-23: qty 10

## 2019-06-23 MED ORDER — DOCUSATE SODIUM 100 MG PO CAPS: 100.0000 mg | ORAL_CAPSULE | Freq: Every day | ORAL | 2 refills | Status: AC | PRN

## 2019-06-23 MED ORDER — OXYCODONE-ACETAMINOPHEN 5-325 MG PO TABS
1.0000 | ORAL_TABLET | ORAL | Status: DC | PRN
  Administered 2019-06-23: 13:00:00 1 via ORAL
  Filled 2019-06-23: qty 1

## 2019-06-23 NOTE — ED Provider Notes (Signed)
[facility] Emergency Department Provider Note       Time seen: ----------------------------------------- 2:01 PM on 06/23/2019 -----------------------------------------   I have reviewed the triage vital signs and the nursing notes.  HISTORY   Chief Complaint Flank Pain    HPI Cheyenne Lopez is a 69 y.o. female with a history of hypertension, hypothyroidism, type 1 diabetes who presents to the ED for right flank pain.  Patient reports right flank pain with radiation around to the right abdomen for the last 2 days.  She denies any history of kidney stones, denies any urinary symptoms.  She has had some nausea.  Discomfort is 9 out of 10 in the right flank.  Past Medical History:  Diagnosis Date  . Hypertension   . Hypothyroid   . Type 1 diabetes (HCC)     There are no problems to display for this patient.   Past Surgical History:  Procedure Laterality Date  . BLADDER SURGERY    . NO PAST SURGERIES      Allergies Patient has no known allergies.  Social History Social History   Tobacco Use  . Smoking status: Never Smoker  . Smokeless tobacco: Never Used  Substance Use Topics  . Alcohol use: No  . Drug use: No    Review of Systems Constitutional: Negative for fever. Cardiovascular: Negative for chest pain. Respiratory: Negative for shortness of breath. Gastrointestinal: Positive for flank pain Musculoskeletal: Negative for back pain. Skin: Negative for rash. Neurological: Negative for headaches, focal weakness or numbness.  All systems negative/normal/unremarkable except as stated in the HPI  ____________________________________________   PHYSICAL EXAM:  VITAL SIGNS: ED Triage Vitals  Enc Vitals Group     BP 06/23/19 1255 (!) 150/58     Pulse Rate 06/23/19 1255 81     Resp 06/23/19 1255 18     Temp 06/23/19 1255 98.9 F (37.2 C)     Temp Source 06/23/19 1255 Oral     SpO2 06/23/19 1255 100 %     Weight --    Height --      Head Circumference --      Peak Flow --      Pain Score 06/23/19 1259 9     Pain Loc --      Pain Edu? --      Excl. in [place]? --    Constitutional: Alert and oriented. Well appearing and in no distress. Eyes: Conjunctivae are normal. Normal extraocular movements. Cardiovascular: Normal rate, regular rhythm. No murmurs, rubs, or gallops. Respiratory: Normal respiratory effort without tachypnea nor retractions. Breath sounds are clear and equal bilaterally. No wheezes/rales/rhonchi. Gastrointestinal: Right flank tenderness, questionable right upper quadrant tenderness, no rebound or guarding.  Normal bowel sounds. Musculoskeletal: Nontender with normal range of motion in extremities. No lower extremity tenderness nor edema. Neurologic:  Normal speech and language. No gross focal neurologic deficits are appreciated.  Skin:  Skin is warm, dry and intact. No rash noted. Psychiatric: Mood and affect are normal. Speech and behavior are normal.  ____________________________________________  ED COURSE:  As part of my medical decision making, I reviewed the following data within the [place] History obtained from family if available, nursing notes, old chart and ekg, as well as notes from prior ED visits. Patient presented for flank pain, we will assess with labs and imaging as indicated at this time.   Procedures  [name] was evaluated in Emergency Department on 06/23/2019 for the symptoms described in  the history of present illness. She was evaluated in the context of the global COVID-19 pandemic, which necessitated consideration that the patient might be at risk for infection with the SARS-CoV-2 virus that causes COVID-19. Institutional protocols and algorithms that pertain to the evaluation of patients at risk for COVID-19 are in a state of rapid change based on information released by regulatory bodies including the CDC and federal and state  organizations. These policies and algorithms were followed during the patient's care in the ED.  ____________________________________________   LABS (pertinent positives/negatives)  Labs Reviewed  URINALYSIS, COMPLETE (UACMP) WITH MICROSCOPIC - Abnormal; Notable for the following components:      Result Value   Color, Urine STRAW (*)    APPearance CLEAR (*)    Specific Gravity, Urine 1.003 (*)    Ketones, ur 5 (*)    Leukocytes,Ua MODERATE (*)    Bacteria, UA RARE (*)    All other components within normal limits  BASIC METABOLIC PANEL - Abnormal; Notable for the following components:   Glucose, Bld 164 (*)    All other components within normal limits  CBC - Abnormal; Notable for the following components:   RBC 3.83 (*)    All other components within normal limits  GLUCOSE, CAPILLARY - Abnormal; Notable for the following components:   Glucose-Capillary 143 (*)    All other components within normal limits  URINE CULTURE  LIPASE, BLOOD    RADIOLOGY Images were viewed by me  CT renal protocol IMPRESSION: 1. No urinary tract calculi or hydronephrosis. 2. Moderate right renal/perirenal edema. Correlate with urinalysis to exclude pyelonephritis. Recent stone passage could look similar. 3. Curvilinear calcifications within the right hemipelvis could be dystrophic or represent stones within a calyceal diverticulum. 4. Pelvic floor laxity. 5. Aortic Atherosclerosis (ICD10-I70.0). ____________________________________________   DIFFERENTIAL DIAGNOSIS   Renal colic, UTI, pyelonephritis, cholecystitis, biliary colic  FINAL ASSESSMENT AND PLAN  Right flank pain, pyelonephritis   Plan: The patient had presented for right flank pain. Patient's labs were grossly unremarkable. Patient's imaging did reveal right renal and perirenal edema.  I have sent a urine culture and we gave her IV Rocephin.  I am presumptively treating for pyelonephritis.  Should be referred to urology for close  outpatient follow-up.   Laurence Aly, MD    Note: This note was generated in part or whole with voice recognition software. Voice recognition is usually quite accurate but there are transcription errors that can and very often do occur. I apologize for any typographical errors that were not detected and corrected.     Earleen Newport, MD 06/23/19 [phone]

## 2019-06-23 NOTE — ED Notes (Signed)
Pt alert and oriented X 4, stable for discharge. RR even and unlabored, color WNL. Discussed discharge instructions and follow up when appropriate. Instructed to follow up with ER for any life threatening symptoms or concerns that patient or family of patient may have  

## 2019-06-23 NOTE — ED Triage Notes (Signed)
Pt in via POV, reports right flank pain with radiation around to right abdomen x 2 days, denies hx of kidney stones, denies any urinary symptoms.  Vitals WDL, NAD noted at this time.

## 2019-06-24 LAB — URINE CULTURE
Culture: NO GROWTH
Special Requests: NORMAL

## 2019-07-14 ENCOUNTER — Other Ambulatory Visit: Payer: Self-pay

## 2019-07-14 ENCOUNTER — Encounter: Payer: Self-pay | Admitting: Emergency Medicine

## 2019-07-14 ENCOUNTER — Inpatient Hospital Stay
Admission: EM | Admit: 2019-07-14 | Discharge: 2019-07-17 | DRG: 247 | Disposition: A | Discharge status: Home / Self Care | Payer: 59 | Attending: Family Medicine | Admitting: Family Medicine

## 2019-07-14 ENCOUNTER — Emergency Department: Payer: 59

## 2019-07-14 DIAGNOSIS — R079 Chest pain, unspecified: Secondary | ICD-10-CM | POA: Diagnosis present

## 2019-07-14 DIAGNOSIS — Z79899 Other long term (current) drug therapy: Secondary | ICD-10-CM

## 2019-07-14 DIAGNOSIS — Z20822 Contact with and (suspected) exposure to covid-19: Secondary | ICD-10-CM | POA: Diagnosis present

## 2019-07-14 DIAGNOSIS — R001 Bradycardia, unspecified: Secondary | ICD-10-CM | POA: Diagnosis present

## 2019-07-14 DIAGNOSIS — E1159 Type 2 diabetes mellitus with other circulatory complications: Secondary | ICD-10-CM | POA: Diagnosis present

## 2019-07-14 DIAGNOSIS — E109 Type 1 diabetes mellitus without complications: Secondary | ICD-10-CM | POA: Diagnosis not present

## 2019-07-14 DIAGNOSIS — E785 Hyperlipidemia, unspecified: Secondary | ICD-10-CM | POA: Diagnosis present

## 2019-07-14 DIAGNOSIS — Z9641 Presence of insulin pump (external) (internal): Secondary | ICD-10-CM | POA: Diagnosis present

## 2019-07-14 DIAGNOSIS — Q245 Malformation of coronary vessels: Secondary | ICD-10-CM

## 2019-07-14 DIAGNOSIS — I214 Non-ST elevation (NSTEMI) myocardial infarction: Principal | ICD-10-CM | POA: Diagnosis present

## 2019-07-14 DIAGNOSIS — R9431 Abnormal electrocardiogram [ECG] [EKG]: Secondary | ICD-10-CM

## 2019-07-14 DIAGNOSIS — I10 Essential (primary) hypertension: Secondary | ICD-10-CM | POA: Diagnosis present

## 2019-07-14 DIAGNOSIS — H409 Unspecified glaucoma: Secondary | ICD-10-CM | POA: Diagnosis present

## 2019-07-14 DIAGNOSIS — E039 Hypothyroidism, unspecified: Secondary | ICD-10-CM | POA: Diagnosis present

## 2019-07-14 DIAGNOSIS — Z7982 Long term (current) use of aspirin: Secondary | ICD-10-CM

## 2019-07-14 DIAGNOSIS — N390 Urinary tract infection, site not specified: Secondary | ICD-10-CM | POA: Diagnosis present

## 2019-07-14 DIAGNOSIS — Z79891 Long term (current) use of opiate analgesic: Secondary | ICD-10-CM

## 2019-07-14 DIAGNOSIS — Z794 Long term (current) use of insulin: Secondary | ICD-10-CM

## 2019-07-14 LAB — BASIC METABOLIC PANEL
Anion gap: 7 (ref 5–15)
BUN: 18 mg/dL (ref 8–23)
CO2: 24 mmol/L (ref 22–32)
Calcium: 8.8 mg/dL — ABNORMAL LOW (ref 8.9–10.3)
Chloride: 108 mmol/L (ref 98–111)
Creatinine, Ser: 0.74 mg/dL (ref 0.44–1.00)
GFR calc Af Amer: >60 mL/min (ref >60)
GFR calc non Af Amer: >60 mL/min (ref >60)
Glucose, Bld: 221 mg/dL — ABNORMAL HIGH (ref 70–99)
Potassium: 4.2 mmol/L (ref 3.5–5.1)
Sodium: 139 mmol/L (ref 135–145)

## 2019-07-14 LAB — CBC
HCT: 32.6 % — ABNORMAL LOW (ref 36.0–46.0)
Hemoglobin: 11.3 g/dL — ABNORMAL LOW (ref 12.0–15.0)
MCH: 33.3 pg (ref 26.0–34.0)
MCHC: 34.7 g/dL (ref 30.0–36.0)
MCV: 96.2 fL (ref 80.0–100.0)
Platelets: 244 10*3/uL (ref 150–400)
RBC: 3.39 MIL/uL — ABNORMAL LOW (ref 3.87–5.11)
RDW: 12.3 % (ref 11.5–15.5)
WBC: 6 10*3/uL (ref 4.0–10.5)
nRBC: 0 % (ref 0.0–0.2)

## 2019-07-14 LAB — TROPONIN I (HIGH SENSITIVITY): Troponin I (High Sensitivity): 23 ng/L — ABNORMAL HIGH (ref <18)

## 2019-07-14 LAB — PROTIME-INR
INR: 1 (ref 0.8–1.2)
Prothrombin Time: 13.4 seconds (ref 11.4–15.2)

## 2019-07-14 LAB — APTT: aPTT: 33 seconds (ref 24–36)

## 2019-07-14 MED ORDER — ZOLPIDEM TARTRATE 5 MG PO TABS: 5.0000 mg | ORAL_TABLET | Freq: Every evening | ORAL | Status: DC | PRN

## 2019-07-14 MED ORDER — SODIUM CHLORIDE 0.9 % IV SOLN: INTRAVENOUS | Status: DC

## 2019-07-14 MED ORDER — SODIUM CHLORIDE 0.9% FLUSH: 3.0000 mL | Freq: Once | INTRAVENOUS | Status: DC

## 2019-07-14 MED ORDER — NITROGLYCERIN 0.4 MG SL SUBL
0.4000 mg | SUBLINGUAL_TABLET | SUBLINGUAL | Status: DC | PRN
  Administered 2019-07-14: 22:00:00 0.4 mg via SUBLINGUAL
  Filled 2019-07-14: qty 1

## 2019-07-14 MED ORDER — ASPIRIN EC 325 MG PO TBEC: 325.0000 mg | DELAYED_RELEASE_TABLET | Freq: Every day | ORAL | Status: DC

## 2019-07-14 MED ORDER — HEPARIN (PORCINE) 25000 UT/250ML-% IV SOLN
750.0000 [IU]/h | INTRAVENOUS | Status: DC
  Administered 2019-07-14: 23:00:00 750 [IU]/h via INTRAVENOUS
  Filled 2019-07-14: qty 250

## 2019-07-14 MED ORDER — IRBESARTAN 75 MG PO TABS: 37.5000 mg | ORAL_TABLET | Freq: Every day | ORAL | Status: DC

## 2019-07-14 MED ORDER — ATORVASTATIN CALCIUM 20 MG PO TABS
20.0000 mg | ORAL_TABLET | Freq: Every day | ORAL | Status: DC
  Administered 2019-07-15: 17:00:00 20 mg via ORAL
  Filled 2019-07-14: qty 1

## 2019-07-14 MED ORDER — OXYCODONE-ACETAMINOPHEN 5-325 MG PO TABS: 1.0000 | ORAL_TABLET | Freq: Three times a day (TID) | ORAL | Status: DC | PRN

## 2019-07-14 MED ORDER — LIDOCAINE VISCOUS HCL 2 % MT SOLN
15.0000 mL | Freq: Once | OROMUCOSAL | Status: DC
  Filled 2019-07-14: qty 15

## 2019-07-14 MED ORDER — MORPHINE SULFATE (PF) 2 MG/ML IV SOLN: 2.0000 mg | INTRAVENOUS | Status: DC | PRN

## 2019-07-14 MED ORDER — INSULIN ASPART 100 UNIT/ML ~~LOC~~ SOLN: 0.0000 [IU] | Freq: Four times a day (QID) | SUBCUTANEOUS | Status: DC

## 2019-07-14 MED ORDER — ALUM & MAG HYDROXIDE-SIMETH 200-200-20 MG/5ML PO SUSP
30.0000 mL | Freq: Once | ORAL | Status: DC
  Filled 2019-07-14: qty 30

## 2019-07-14 MED ORDER — ALPRAZOLAM 0.25 MG PO TABS: 0.2500 mg | ORAL_TABLET | Freq: Two times a day (BID) | ORAL | Status: DC | PRN

## 2019-07-14 MED ORDER — LATANOPROST 0.005 % OP SOLN
1.0000 [drp] | Freq: Every day | OPHTHALMIC | Status: DC
  Administered 2019-07-15: 1 [drp] via OPHTHALMIC
  Filled 2019-07-14 (×2): qty 2.5

## 2019-07-14 MED ORDER — ASPIRIN 81 MG PO CHEW
324.0000 mg | CHEWABLE_TABLET | Freq: Once | ORAL | Status: AC
  Administered 2019-07-14: 22:00:00 324 mg via ORAL
  Filled 2019-07-14: qty 4

## 2019-07-14 MED ORDER — HEPARIN BOLUS VIA INFUSION
3700.0000 [IU] | Freq: Once | INTRAVENOUS | Status: AC
  Administered 2019-07-14: 23:00:00 3700 [IU] via INTRAVENOUS
  Filled 2019-07-14: qty 3700

## 2019-07-14 MED ORDER — THYROID 60 MG PO TABS: 65.0000 mg | ORAL_TABLET | Freq: Every day | ORAL | Status: DC

## 2019-07-14 MED ORDER — ONDANSETRON HCL 4 MG/2ML IJ SOLN: 4.0000 mg | Freq: Four times a day (QID) | INTRAMUSCULAR | Status: DC | PRN

## 2019-07-14 MED ORDER — ASPIRIN EC 81 MG PO TBEC: 81.0000 mg | DELAYED_RELEASE_TABLET | Freq: Every day | ORAL | Status: DC

## 2019-07-14 MED ORDER — ACETAMINOPHEN 325 MG PO TABS: 650.0000 mg | ORAL_TABLET | ORAL | Status: DC | PRN

## 2019-07-14 NOTE — Progress Notes (Signed)
ANTICOAGULATION CONSULT NOTE - Initial Consult  Pharmacy Consult for Heparin  Indication: chest pain/ACS  No Known Allergies  Patient Measurements: Height: 5\' 1"  (154.9 cm) Weight: 68.5 kg (151 lb) IBW/kg (Calculated) : 47.8 Heparin Dosing Weight: 62.4 kg   Vital Signs: Temp: 97.9 F (36.6 C) (04/20 2030) Temp Source: Oral (04/20 2030) BP: 149/65 (04/20 2109) Pulse Rate: 47 (04/20 2109)  Labs: Recent Labs    07/14/19 2042  HGB 11.3*  HCT 32.6*  PLT 244  CREATININE 0.74  TROPONINIHS 23*    Estimated Creatinine Clearance: 59.6 mL/min (by C-G formula based on SCr of 0.74 mg/dL).   Medical History: Past Medical History:  Diagnosis Date  . Hypertension   . Hypothyroid   . Type 1 diabetes (HCC)     Medications:  (Not in a hospital admission)   Assessment: Pharmacy consulted to dose heparin in this 69 year old female admitted with ACS/NSTEMI.  CrCl = 59.6 ml/min  No prior anticoag noted.   Goal of Therapy:  Heparin level 0.3-0.7 units/ml Monitor platelets by anticoagulation protocol: Yes   Plan:  Give 3700 units bolus x 1 Start heparin infusion at 750 units/hr Check anti-Xa level in 6 hours and daily while on heparin Continue to monitor H&H and platelets  Crispin Vogel D 07/14/2019,9:35 PM

## 2019-07-14 NOTE — ED Notes (Signed)
pt to Xray

## 2019-07-14 NOTE — ED Triage Notes (Addendum)
Pt presents to ED via EMS from home with c/o mid sternal chest pain that radiates into her left arm. Onset 2 days ago. Pt denies sob. Symptoms worse today and pt states she felt like she was going to pass out and broke out in a cold sweat.

## 2019-07-14 NOTE — ED Provider Notes (Signed)
[facility] Emergency Department Provider Note   ____________________________________________   First MD Initiated Contact with Patient 07/14/19 2056     (approximate)  I have reviewed the triage vital signs and the nursing notes.   HISTORY  Chief Complaint Chest Pain    HPI Cheyenne Lopez is a 69 y.o. female with past medical history of hypertension and diabetes who presents to the ED complaining of chest pain.  Patient reports she has had intermittent pain in the center of her chest radiating into her left arm over the past 2 to 3 days.  It can come on at any time and is not associated with exertion or a deep breath.  She describes it as a burning that seems to last for about 30 minutes before resolving.  Symptoms seem to be worse today and she had a more severe episode just prior to arrival that was associated with some lightheadedness, near syncope, and diaphoresis.  She denies any cardiac history, but follows with Dr. Humphrey Rolls of cardiology for prior episodes of chest pain.  She took 1 nitroglycerin at home that partially alleviated her symptoms.        Past Medical History:  Diagnosis Date  . Hypertension   . Hypothyroid   . Type 1 diabetes [facility])     Patient Active Problem List   Diagnosis Date Noted  . Chest pain 07/14/2019    Past Surgical History:  Procedure Laterality Date  . BLADDER SURGERY    . NO PAST SURGERIES      Prior to Admission medications   Medication Sig Start Date End Date Taking? Authorizing Provider  aspirin EC 81 MG tablet Take 81 mg by mouth daily.    [provider]  docusate sodium (COLACE) 100 MG capsule Take 1 capsule (100 mg total) by mouth daily as needed. 06/23/19 06/22/20  Earleen Newport, MD  insulin glargine (LANTUS) 100 UNIT/ML injection Lantus U-100 Insulin 100 unit/mL subcutaneous solution 09/28/15   [provider]  Insulin Human (INSULIN PUMP) SOLN Inject into the skin.    [provider]  insulin lispro (HUMALOG) 100 UNIT/ML injection Inject into the skin 3 (three) times daily before meals.    [provider]  latanoprost (XALATAN) 0.005 % ophthalmic solution 1 drop at bedtime.    [provider]  meloxicam (MOBIC) 7.5 MG tablet Take 1 tablet (7.5 mg total) by mouth daily. 02/07/19   Lorin Picket, PA-C  oxyCODONE-acetaminophen (PERCOCET) 5-325 MG tablet Take 1 tablet by mouth every 8 (eight) hours as needed. 06/23/19   Earleen Newport, MD  thyroid (ARMOUR) 65 MG tablet Take 65 mg by mouth daily.    [provider]  valsartan (DIOVAN) 40 MG tablet Take 20 mg by mouth daily.  10/27/17   [provider]  losartan (COZAAR) 25 MG tablet Take 25 mg by mouth daily.  02/07/19  [provider]  ranitidine (ZANTAC) 300 MG tablet  08/22/17 02/07/19  [provider]    Allergies Patient has no known allergies.  Family History  Problem Relation Age of Onset  . Breast cancer Neg Hx     Social History Social History   Tobacco Use  . Smoking status: Never Smoker  . Smokeless tobacco: Never Used  Substance Use Topics  . Alcohol use: No  . Drug use: No    Review of Systems  Constitutional: No fever/chills.  Positive for lightheadedness. Eyes: No visual changes. ENT: No sore throat.  Cardiovascular: Positive for chest pain. Respiratory: Denies shortness of breath. Gastrointestinal: No abdominal pain.  No nausea, no vomiting.  No diarrhea.  No constipation. Genitourinary: Negative for dysuria. Musculoskeletal: Negative for back pain. Skin: Negative for rash. Neurological: Negative for headaches, focal weakness or numbness.  ____________________________________________   PHYSICAL EXAM:  VITAL SIGNS: ED Triage Vitals  Enc Vitals Group     BP 07/14/19 2030 (!) 138/49     Pulse Rate 07/14/19 2030 (!) 50     Resp 07/14/19 2030 18     Temp 07/14/19 2030 97.9 F (36.6 C)     Temp Source 07/14/19  2030 Oral     SpO2 07/14/19 2030 100 %     Weight 07/14/19 2030 151 lb (68.5 kg)     Height 07/14/19 2030 5\' 1"  (1.549 m)     Head Circumference --      Peak Flow --      Pain Score 07/14/19 2036 7     Pain Loc --      Pain Edu? --      Excl. in [place]? --     Constitutional: Alert and oriented. Eyes: Conjunctivae are normal. Head: Atraumatic. Nose: No congestion/rhinnorhea. Mouth/Throat: Mucous membranes are moist. Neck: Normal ROM Cardiovascular: Normal rate, regular rhythm. Grossly normal heart sounds.  2+ radial pulses bilaterally. Respiratory: Normal respiratory effort.  No retractions. Lungs CTAB. Gastrointestinal: Soft and nontender. No distention. Genitourinary: deferred Musculoskeletal: No lower extremity tenderness nor edema. Neurologic:  Normal speech and language. No gross focal neurologic deficits are appreciated. Skin:  Skin is warm, dry and intact. No rash noted. Psychiatric: Mood and affect are normal. Speech and behavior are normal.  ____________________________________________   LABS (all labs ordered are listed, but only abnormal results are displayed)  Labs Reviewed  BASIC METABOLIC PANEL - Abnormal; Notable for the following components:      Result Value   Glucose, Bld 221 (*)    Calcium 8.8 (*)    All other components within normal limits  CBC - Abnormal; Notable for the following components:   RBC 3.39 (*)    Hemoglobin 11.3 (*)    HCT 32.6 (*)    All other components within normal limits  TROPONIN I (HIGH SENSITIVITY) - Abnormal; Notable for the following components:   Troponin I (High Sensitivity) 23 (*)    All other components within normal limits  SARS CORONAVIRUS 2 (TAT 6-24 HRS)  APTT  PROTIME-INR  HEPARIN LEVEL (UNFRACTIONATED)  HEMOGLOBIN A1C  HIV ANTIBODY (ROUTINE TESTING W REFLEX)  TSH  TROPONIN I (HIGH SENSITIVITY)   ____________________________________________  EKG  ED ECG REPORT I, Blake Divine, the attending physician,  personally viewed and interpreted this ECG.   Date: 07/14/2019  EKG Time: 20:28  Rate: 52  Rhythm: sinus bradycardia  Axis: Normal  Intervals:none  ST&T Change: ST depressions inferiorly and anteriorly with biphasic T waves   ED ECG REPORT I, Blake Divine, the attending physician, personally viewed and interpreted this ECG.   Date: 07/14/2019  EKG Time: 21:38  Rate: 50  Rhythm: sinus bradycardia  Axis: Normal  Intervals:none  ST&T Change: Borderline ST depressions anteriorly with biphasic T waves   PROCEDURES  Procedure(s) performed (including Critical Care):  .1-3 Lead EKG Interpretation Performed by: Blake Divine, MD Authorized by: Blake Divine, MD     Interpretation: normal     ECG rate:  55   ECG rate assessment: bradycardic     Rhythm: sinus rhythm     Ectopy:  none     Conduction: normal       ____________________________________________   INITIAL IMPRESSION / ASSESSMENT AND PLAN / ED COURSE       69 year old female with history of hypertension, hyperlipidemia, and hypothyroidism who presents to the ED complaining of intermittent chest pain over the past 2 to 3 days that seemed worse today.  Pain seems to be improving following nitroglycerin at home, now has almost entirely resolved following nitroglycerin here in the ED.  Initial EKG was concerning due to ST depressions and biphasic T waves, however repeat showed improvement in these ST changes.  Initial troponin very mildly elevated at 23 and we will continue to trend.  Remainder of lab work and chest x-ray are unremarkable.  Case was discussed with Dr. Humphrey Rolls of cardiology, who recommends starting patient on heparin given concerning EKG findings and he will be available for consultation.  Case was discussed with hospitalist for admission.      ____________________________________________   FINAL CLINICAL IMPRESSION(S) / ED DIAGNOSES  Final diagnoses:  Chest pain, unspecified type     ED  Discharge Orders    None       Note:  This document was prepared using Dragon voice recognition software and may include unintentional dictation errors.   Blake Divine, MD 07/14/19 2255

## 2019-07-14 NOTE — ED Triage Notes (Signed)
First nurse note: Per EMS report, patient c/o 2 days of intermittent mid-sternal chest pain that radiates to left arm. Patient c/o near-syncope when standing. Per EMS report, patient was negative with orthostatic VS, 12-Lead was unremarkable.  Pulse 50's B/P 138/70 Room air 100%  Patient received 324mg  Aspirin, 551ml NS bolus.  Patient is not on a beta blocker, does report and "exploratory cath" but no MI's.

## 2019-07-14 NOTE — H&P (Addendum)
[facility] NAME: [name]    MR#:  [id number]  DATE OF BIRTH:  1950/07/12  DATE OF ADMISSION:  07/14/2019  PRIMARY CARE PHYSICIAN: Valera Castle, MD   REQUESTING/REFERRING PHYSICIAN: Blake Divine, MD  CHIEF COMPLAINT:   Chief Complaint  Patient presents with  . Chest Pain    HISTORY OF PRESENT ILLNESS:  Cheyenne Lopez  is a 69 y.o. [name] with a known history of hypertension, hypothyroidism and type 1 diabetes mellitus, presented to the emergency room with acute onset of midsternal chest pain, felt as pressure and graded 9/10 in severity with associated diaphoresis and presyncope without nausea or vomiting or dyspnea or palpitations.  She denied any cough or wheezing or hemoptysis.  No leg pain or edema recent travels or surgeries.  No recent COVID-19 exposure.  She denies any fever or chills.  No bleeding diathesis.  Upon presentation to the emergency room, heart rate was 49 and blood pressure 122/56 with otherwise normal vital signs. Labs revealed blood glucose of 221 and her high-sensitivity troponin I came back 23. CBC showed mild anemia with no previous levels for comparison. EKG showed sinus bradycardia with rate of 50 with biphasic T waves anterolaterally with no ST depression.  The case was discussed with Dr. Humphrey Rolls. The patient was given 40 aspirin, sublingual nitroglycerin, IV heparin bolus and drip. She will be admitted to an observation cardiac progressive bed for further evaluation and management. PAST MEDICAL HISTORY:   Past Medical History:  Diagnosis Date  . Hypertension   . Hypothyroid   . Type 1 diabetes ([place])     PAST SURGICAL HISTORY:   Past Surgical History:  Procedure Laterality Date  . BLADDER SURGERY    . NO PAST SURGERIES      SOCIAL HISTORY:   Social History   Tobacco Use  . Smoking status: Never Smoker  . Smokeless tobacco: Never Used  Substance Use Topics  . Alcohol use: No    FAMILY  HISTORY:   Family History  Problem Relation Age of Onset  . Breast cancer Neg Hx     DRUG ALLERGIES:  No Known Allergies  REVIEW OF SYSTEMS:   ROS As per history of present illness. All pertinent systems were reviewed above. Constitutional,  HEENT, cardiovascular, respiratory, GI, GU, musculoskeletal, neuro, psychiatric, endocrine,  integumentary and hematologic systems were reviewed and are otherwise  negative/unremarkable except for positive findings mentioned above in the HPI.   MEDICATIONS AT HOME:   Prior to Admission medications   Medication Sig Start Date End Date Taking? Authorizing Provider  aspirin EC 81 MG tablet Take 81 mg by mouth daily.    [provider]  docusate sodium (COLACE) 100 MG capsule Take 1 capsule (100 mg total) by mouth daily as needed. 06/23/19 06/22/20  Earleen Newport, MD  insulin glargine (LANTUS) 100 UNIT/ML injection Lantus U-100 Insulin 100 unit/mL subcutaneous solution 09/28/15   [provider]  Insulin Human (INSULIN PUMP) SOLN Inject into the skin.    [provider]  insulin lispro (HUMALOG) 100 UNIT/ML injection Inject into the skin 3 (three) times daily before meals.    [provider]  latanoprost (XALATAN) 0.005 % ophthalmic solution 1 drop at bedtime.    [provider]  meloxicam (MOBIC) 7.5 MG tablet Take 1 tablet (7.5 mg total) by mouth daily. 02/07/19   Lorin Picket, PA-C  oxyCODONE-acetaminophen (PERCOCET) 5-325 MG tablet Take 1 tablet by mouth  every 8 (eight) hours as needed. 06/23/19   Earleen Newport, MD  thyroid (ARMOUR) 65 MG tablet Take 65 mg by mouth daily.    [provider]  valsartan (DIOVAN) 40 MG tablet Take 20 mg by mouth daily.  10/27/17   [provider]  losartan (COZAAR) 25 MG tablet Take 25 mg by mouth daily.  02/07/19  [provider]  ranitidine (ZANTAC) 300 MG tablet  08/22/17 02/07/19  [provider]      VITAL SIGNS:   Blood pressure (!) 122/56, pulse (!) 49, temperature 97.9 F (36.6 C), temperature source Oral, resp. rate 14, height 5\' 1"  (1.549 m), weight 68.5 kg, SpO2 100 %.  PHYSICAL EXAMINATION:  Physical Exam  GENERAL:  69 y.o.-year-old [name] patient lying in the bed with no acute distress.  EYES: Pupils equal, round, reactive to light and accommodation. No scleral icterus. Extraocular muscles intact.  HEENT: Head atraumatic, normocephalic. Oropharynx and nasopharynx clear.  NECK:  Supple, no jugular venous distention. No thyroid enlargement, no tenderness.  LUNGS: Normal breath sounds bilaterally, no wheezing, rales,rhonchi or crepitation. No use of accessory muscles of respiration.  CARDIOVASCULAR: Regular rate and rhythm, S1, S2 normal. No murmurs, rubs, or gallops.  ABDOMEN: Soft, nondistended, nontender. Bowel sounds present. No organomegaly or mass.  EXTREMITIES: No pedal edema, cyanosis, or clubbing.  NEUROLOGIC: Cranial nerves II through XII are intact. Muscle strength 5/5 in all extremities. Sensation intact. Gait not checked.  PSYCHIATRIC: The patient is alert and oriented x 3.  Normal affect and good eye contact. SKIN: No obvious rash, lesion, or ulcer.   LABORATORY PANEL:   CBC Recent Labs  Lab 07/14/19 2042  WBC 6.0  HGB 11.3*  HCT 32.6*  PLT 244   ------------------------------------------------------------------------------------------------------------------  Chemistries  Recent Labs  Lab 07/14/19 2042  NA 139  K 4.2  CL 108  CO2 24  GLUCOSE 221*  BUN 18  CREATININE 0.74  CALCIUM 8.8*   ------------------------------------------------------------------------------------------------------------------  Cardiac Enzymes No results for input(s): TROPONINI in the last 168 hours. ------------------------------------------------------------------------------------------------------------------  RADIOLOGY:  DG Chest 2 View  Result Date: 07/14/2019  CLINICAL DATA:  Chest pain EXAM: CHEST - 2 VIEW COMPARISON:  09/16/2012 FINDINGS: The heart size and mediastinal contours are within normal limits. Both lungs are clear. The visualized skeletal structures are unremarkable. IMPRESSION: No active cardiopulmonary disease. Electronically Signed   By: Donavan Foil M.D.   On: 07/14/2019 20:53      IMPRESSION AND PLAN:   1. Chest pain, rule out acute coronary syndrome, with abnormal EKG. -The patient will be admitted to an observation cardiac progressive unit bed. -We will follow serial troponin I's. -The patient will be placed on enteric-coated aspirin as well as as needed sublingual nitroglycerin and morphine sulfate for pain. -We will continue her on IV heparin drip. -2D echo will be obtained in a.m. -She will be kept n.p.o. after midnight. -Cardiology consultation will be obtained. -I notified Dr. Humphrey Rolls who is aware about the patient.  2. Type 1 diabetes mellitus. -We will place on supplemental coverage with NovoLog and continue her basal coverage when medication reconciliation is complete. -She can continue on her home insulin pump.  Her husband was going home to get its reservoir. -Basal coverage with long-acting insulin can be continued at half home dose when she is n.p.o.  3. Hypothyroidism. -We will continue her Armour Thyroid and check TSH level.  4. Dyslipidemia. -We will continue statin therapy and check fasting lipids.  5. Hypertension. -  We will continue her Diovan.  6. Glaucoma. -We will continue her ophthalmic GTT.  7. DVT prophylaxis. -The patient will be on IV heparin.   All the records are reviewed and case discussed with ED provider. The plan of care was discussed in details with the patient (and family). I answered all questions. The patient agreed to proceed with the above mentioned plan. Further management will depend upon hospital course.   CODE STATUS: Full code  Status is: Observation  The patient  remains OBS appropriate and will d/c before 2 midnights.  Dispo: The patient is from: Home              Anticipated d/c is to: Home              Anticipated d/c date is: 1 day              Patient currently is not medically stable to d/c.    TOTAL TIME TAKING CARE OF THIS PATIENT: 50 minutes.    Christel Mormon M.D on 07/14/2019 at 10:39 PM  [facility]   From 7 PM-7 AM, contact night-coverage [email or web address]  CC: Primary care physician; Valera Castle, MD   Note: This dictation was prepared with Dragon dictation along with smaller phrase technology. Any transcriptional errors that result from this process are unintentional.

## 2019-07-14 NOTE — ED Notes (Signed)
Per Dr. Sidney Ace pt can continue to monitor CBG and insulin with pump while she is NPO. Per Dr. Sidney Ace long acting insulin will be given as half dose while pt is NPO. Long acting will be held if pt is hypoglycemic.

## 2019-07-14 NOTE — ED Notes (Signed)
This RN at bedside. Pt's husband stating he does not want pt to turn off her insulin pump. This RN explained to pt our sliding scale insulin and regular CBG checks. Pt's husband repeating he does not want pump turned off. Admitting MD at bedside.

## 2019-07-15 ENCOUNTER — Encounter
Admission: EM | Disposition: A | Discharge status: Home / Self Care | Payer: Self-pay | Source: Home / Self Care | Attending: Family Medicine

## 2019-07-15 ENCOUNTER — Other Ambulatory Visit: Payer: Self-pay

## 2019-07-15 ENCOUNTER — Inpatient Hospital Stay
Admit: 2019-07-15 | Discharge: 2019-07-15 | Disposition: A | Discharge status: Home / Self Care | Payer: 59 | Attending: Family Medicine | Admitting: Family Medicine

## 2019-07-15 ENCOUNTER — Encounter: Payer: Self-pay | Admitting: Family Medicine

## 2019-07-15 DIAGNOSIS — Z79891 Long term (current) use of opiate analgesic: Secondary | ICD-10-CM | POA: Diagnosis not present

## 2019-07-15 DIAGNOSIS — E109 Type 1 diabetes mellitus without complications: Secondary | ICD-10-CM | POA: Diagnosis present

## 2019-07-15 DIAGNOSIS — I10 Essential (primary) hypertension: Secondary | ICD-10-CM | POA: Diagnosis present

## 2019-07-15 DIAGNOSIS — I208 Other forms of angina pectoris: Secondary | ICD-10-CM

## 2019-07-15 DIAGNOSIS — I214 Non-ST elevation (NSTEMI) myocardial infarction: Principal | ICD-10-CM

## 2019-07-15 DIAGNOSIS — H409 Unspecified glaucoma: Secondary | ICD-10-CM | POA: Diagnosis present

## 2019-07-15 DIAGNOSIS — Z20822 Contact with and (suspected) exposure to covid-19: Secondary | ICD-10-CM | POA: Diagnosis present

## 2019-07-15 DIAGNOSIS — Z9641 Presence of insulin pump (external) (internal): Secondary | ICD-10-CM | POA: Diagnosis present

## 2019-07-15 DIAGNOSIS — E785 Hyperlipidemia, unspecified: Secondary | ICD-10-CM | POA: Diagnosis present

## 2019-07-15 DIAGNOSIS — R001 Bradycardia, unspecified: Secondary | ICD-10-CM | POA: Diagnosis present

## 2019-07-15 DIAGNOSIS — E039 Hypothyroidism, unspecified: Secondary | ICD-10-CM | POA: Diagnosis present

## 2019-07-15 DIAGNOSIS — I2510 Atherosclerotic heart disease of native coronary artery without angina pectoris: Secondary | ICD-10-CM

## 2019-07-15 DIAGNOSIS — N390 Urinary tract infection, site not specified: Secondary | ICD-10-CM | POA: Diagnosis not present

## 2019-07-15 DIAGNOSIS — R079 Chest pain, unspecified: Secondary | ICD-10-CM | POA: Diagnosis present

## 2019-07-15 DIAGNOSIS — Z794 Long term (current) use of insulin: Secondary | ICD-10-CM | POA: Diagnosis not present

## 2019-07-15 DIAGNOSIS — Q245 Malformation of coronary vessels: Secondary | ICD-10-CM | POA: Diagnosis not present

## 2019-07-15 DIAGNOSIS — I5189 Other ill-defined heart diseases: Secondary | ICD-10-CM

## 2019-07-15 DIAGNOSIS — Z7982 Long term (current) use of aspirin: Secondary | ICD-10-CM | POA: Diagnosis not present

## 2019-07-15 DIAGNOSIS — Z79899 Other long term (current) drug therapy: Secondary | ICD-10-CM | POA: Diagnosis not present

## 2019-07-15 HISTORY — PX: LEFT HEART CATH AND CORONARY ANGIOGRAPHY: CATH118249

## 2019-07-15 HISTORY — DX: Other ill-defined heart diseases: I51.89

## 2019-07-15 HISTORY — PX: CORONARY STENT INTERVENTION: CATH118234

## 2019-07-15 HISTORY — DX: Atherosclerotic heart disease of native coronary artery without angina pectoris: I25.10

## 2019-07-15 LAB — GLUCOSE, CAPILLARY
Glucose-Capillary: 268 mg/dL — ABNORMAL HIGH (ref 70–99)
Glucose-Capillary: 161 mg/dL — ABNORMAL HIGH (ref 70–99)
Glucose-Capillary: 133 mg/dL — ABNORMAL HIGH (ref 70–99)
Glucose-Capillary: 115 mg/dL — ABNORMAL HIGH (ref 70–99)
Glucose-Capillary: 277 mg/dL — ABNORMAL HIGH (ref 70–99)
Glucose-Capillary: 260 mg/dL — ABNORMAL HIGH (ref 70–99)

## 2019-07-15 LAB — TROPONIN I (HIGH SENSITIVITY)
Troponin I (High Sensitivity): 145 ng/L (ref <18)
Troponin I (High Sensitivity): 11794 ng/L (ref <18)

## 2019-07-15 LAB — BASIC METABOLIC PANEL
Anion gap: 4 — ABNORMAL LOW (ref 5–15)
BUN: 18 mg/dL (ref 8–23)
CO2: 24 mmol/L (ref 22–32)
Calcium: 8.3 mg/dL — ABNORMAL LOW (ref 8.9–10.3)
Chloride: 112 mmol/L — ABNORMAL HIGH (ref 98–111)
Creatinine, Ser: 0.59 mg/dL (ref 0.44–1.00)
GFR calc Af Amer: >60 mL/min (ref >60)
GFR calc non Af Amer: >60 mL/min (ref >60)
Glucose, Bld: 187 mg/dL — ABNORMAL HIGH (ref 70–99)
Potassium: 3.8 mmol/L (ref 3.5–5.1)
Sodium: 140 mmol/L (ref 135–145)

## 2019-07-15 LAB — RESPIRATORY PANEL BY RT PCR (FLU A&B, COVID)
Influenza A by PCR: NEGATIVE
Influenza B by PCR: NEGATIVE
SARS Coronavirus 2 by RT PCR: NEGATIVE

## 2019-07-15 LAB — POCT ACTIVATED CLOTTING TIME: Activated Clotting Time: 395 seconds

## 2019-07-15 LAB — TSH: TSH: 0.735 u[IU]/mL (ref 0.350–4.500)

## 2019-07-15 LAB — MAGNESIUM: Magnesium: 2.2 mg/dL (ref 1.7–2.4)

## 2019-07-15 LAB — HEPARIN LEVEL (UNFRACTIONATED): Heparin Unfractionated: 0.69 IU/mL (ref 0.30–0.70)

## 2019-07-15 SURGERY — LEFT HEART CATH AND CORONARY ANGIOGRAPHY
Anesthesia: Moderate Sedation

## 2019-07-15 MED ORDER — SODIUM CHLORIDE 0.9 % WEIGHT BASED INFUSION: 1.0000 mL/kg/h | INTRAVENOUS | Status: AC

## 2019-07-15 MED ORDER — ATROPINE SULFATE 1 MG/10ML IJ SOSY
PREFILLED_SYRINGE | INTRAMUSCULAR | Status: AC
  Filled 2019-07-15: qty 10

## 2019-07-15 MED ORDER — SODIUM CHLORIDE 0.9% FLUSH: 3.0000 mL | INTRAVENOUS | Status: DC | PRN

## 2019-07-15 MED ORDER — HEPARIN (PORCINE) IN NACL 1000-0.9 UT/500ML-% IV SOLN
INTRAVENOUS | Status: AC
  Filled 2019-07-15: qty 1000

## 2019-07-15 MED ORDER — SODIUM CHLORIDE 0.9 % WEIGHT BASED INFUSION
1.0000 mL/kg/h | INTRAVENOUS | Status: AC
  Administered 2019-07-15: 17:00:00 1 mL/kg/h via INTRAVENOUS

## 2019-07-15 MED ORDER — HEPARIN (PORCINE) IN NACL 1000-0.9 UT/500ML-% IV SOLN
INTRAVENOUS | Status: DC | PRN
  Administered 2019-07-15 (×2): 500 mL

## 2019-07-15 MED ORDER — TICAGRELOR 90 MG PO TABS
90.0000 mg | ORAL_TABLET | Freq: Two times a day (BID) | ORAL | Status: DC
  Administered 2019-07-15 – 2019-07-17 (×4): 90 mg via ORAL
  Filled 2019-07-15 (×4): qty 1

## 2019-07-15 MED ORDER — ACETAMINOPHEN 325 MG PO TABS: 650.0000 mg | ORAL_TABLET | ORAL | Status: DC | PRN

## 2019-07-15 MED ORDER — TICAGRELOR 90 MG PO TABS
ORAL_TABLET | ORAL | Status: AC
  Filled 2019-07-15: qty 2

## 2019-07-15 MED ORDER — IRBESARTAN 75 MG PO TABS
37.5000 mg | ORAL_TABLET | Freq: Every day | ORAL | Status: DC
  Administered 2019-07-16 – 2019-07-17 (×2): 37.5 mg via ORAL
  Filled 2019-07-15 (×2): qty 0.5

## 2019-07-15 MED ORDER — BIVALIRUDIN BOLUS VIA INFUSION - CUPID
INTRAVENOUS | Status: DC | PRN
  Administered 2019-07-15: 51.375 mg via INTRAVENOUS

## 2019-07-15 MED ORDER — CEFTRIAXONE SODIUM 1 G IJ SOLR: 1.0000 g | INTRAMUSCULAR | Status: DC

## 2019-07-15 MED ORDER — SODIUM CHLORIDE 0.9 % WEIGHT BASED INFUSION: 3.0000 mL/kg/h | INTRAVENOUS | Status: DC

## 2019-07-15 MED ORDER — MIDAZOLAM HCL 2 MG/2ML IJ SOLN
INTRAMUSCULAR | Status: AC
  Filled 2019-07-15: qty 2

## 2019-07-15 MED ORDER — SODIUM CHLORIDE 0.9% FLUSH
3.0000 mL | Freq: Two times a day (BID) | INTRAVENOUS | Status: DC
  Administered 2019-07-15 – 2019-07-17 (×3): 3 mL via INTRAVENOUS

## 2019-07-15 MED ORDER — HYDRALAZINE HCL 20 MG/ML IJ SOLN: 10.0000 mg | INTRAMUSCULAR | Status: DC | PRN

## 2019-07-15 MED ORDER — LABETALOL HCL 5 MG/ML IV SOLN: 10.0000 mg | INTRAVENOUS | Status: DC | PRN

## 2019-07-15 MED ORDER — HYDRALAZINE HCL 20 MG/ML IJ SOLN: 10.0000 mg | INTRAMUSCULAR | Status: AC | PRN

## 2019-07-15 MED ORDER — INSULIN PUMP
SUBCUTANEOUS | Status: DC
  Administered 2019-07-16: 1.3 via SUBCUTANEOUS
  Administered 2019-07-17: 8 via SUBCUTANEOUS
  Filled 2019-07-15: qty 1

## 2019-07-15 MED ORDER — SODIUM CHLORIDE 0.9 % IV SOLN: 250.0000 mL | INTRAVENOUS | Status: DC | PRN

## 2019-07-15 MED ORDER — IOHEXOL 300 MG/ML  SOLN
INTRAMUSCULAR | Status: DC | PRN
  Administered 2019-07-15: 100 mL

## 2019-07-15 MED ORDER — SODIUM CHLORIDE 0.9 % IV SOLN
INTRAVENOUS | Status: AC | PRN
  Administered 2019-07-15: 100 mL/h via INTRAVENOUS

## 2019-07-15 MED ORDER — SODIUM CHLORIDE 0.9 % IV SOLN
INTRAVENOUS | Status: AC | PRN
  Administered 2019-07-15: 11:00:00 1.75 mg/kg/h via INTRAVENOUS

## 2019-07-15 MED ORDER — ONDANSETRON HCL 4 MG/2ML IJ SOLN
4.0000 mg | Freq: Four times a day (QID) | INTRAMUSCULAR | Status: DC | PRN
  Administered 2019-07-15: 4 mg via INTRAVENOUS
  Filled 2019-07-15: qty 2

## 2019-07-15 MED ORDER — BIVALIRUDIN TRIFLUOROACETATE 250 MG IV SOLR
INTRAVENOUS | Status: AC
  Filled 2019-07-15: qty 250

## 2019-07-15 MED ORDER — ONDANSETRON HCL 4 MG/2ML IJ SOLN
INTRAMUSCULAR | Status: DC | PRN
  Administered 2019-07-15: 4 mg via INTRAVENOUS

## 2019-07-15 MED ORDER — SODIUM CHLORIDE 0.9 % IV SOLN
0.2500 mg/kg/h | INTRAVENOUS | Status: AC
  Filled 2019-07-15: qty 250

## 2019-07-15 MED ORDER — SODIUM CHLORIDE 0.9% FLUSH
3.0000 mL | Freq: Two times a day (BID) | INTRAVENOUS | Status: DC
  Administered 2019-07-15 – 2019-07-17 (×3): 3 mL via INTRAVENOUS

## 2019-07-15 MED ORDER — TICAGRELOR 90 MG PO TABS
ORAL_TABLET | ORAL | Status: DC | PRN
  Administered 2019-07-15: 180 mg via ORAL

## 2019-07-15 MED ORDER — SODIUM CHLORIDE 0.9% FLUSH
3.0000 mL | Freq: Two times a day (BID) | INTRAVENOUS | Status: DC
  Administered 2019-07-15 – 2019-07-16 (×2): 3 mL via INTRAVENOUS

## 2019-07-15 MED ORDER — LABETALOL HCL 5 MG/ML IV SOLN: 10.0000 mg | INTRAVENOUS | Status: AC | PRN

## 2019-07-15 MED ORDER — ONDANSETRON HCL 4 MG/2ML IJ SOLN: 4.0000 mg | Freq: Four times a day (QID) | INTRAMUSCULAR | Status: DC | PRN

## 2019-07-15 MED ORDER — FENTANYL CITRATE (PF) 100 MCG/2ML IJ SOLN
INTRAMUSCULAR | Status: AC
  Filled 2019-07-15: qty 2

## 2019-07-15 MED ORDER — SODIUM CHLORIDE 0.9 % WEIGHT BASED INFUSION: 1.0000 mL/kg/h | INTRAVENOUS | Status: DC

## 2019-07-15 MED ORDER — FENTANYL CITRATE (PF) 100 MCG/2ML IJ SOLN
INTRAMUSCULAR | Status: DC | PRN
  Administered 2019-07-15: 25 ug via INTRAVENOUS

## 2019-07-15 MED ORDER — FENTANYL CITRATE (PF) 100 MCG/2ML IJ SOLN
INTRAMUSCULAR | Status: DC | PRN
  Administered 2019-07-15 (×2): 25 ug via INTRAVENOUS

## 2019-07-15 MED ORDER — MIDAZOLAM HCL 2 MG/2ML IJ SOLN
INTRAMUSCULAR | Status: DC | PRN
  Administered 2019-07-15: 1 mg via INTRAVENOUS

## 2019-07-15 MED ORDER — ASPIRIN 81 MG PO CHEW
81.0000 mg | CHEWABLE_TABLET | Freq: Every day | ORAL | Status: DC
  Administered 2019-07-16 – 2019-07-17 (×2): 81 mg via ORAL
  Filled 2019-07-15 (×2): qty 1

## 2019-07-15 MED ORDER — CHLORHEXIDINE GLUCONATE 0.12 % MT SOLN: 30.0000 mL | OROMUCOSAL | Status: DC

## 2019-07-15 MED ORDER — SODIUM CHLORIDE 0.9 % IV SOLN
1.0000 g | INTRAVENOUS | Status: DC
  Administered 2019-07-15 – 2019-07-16 (×2): 1 g via INTRAVENOUS
  Filled 2019-07-15: qty 10
  Filled 2019-07-15: qty 1
  Filled 2019-07-15: qty 10

## 2019-07-15 MED ORDER — THYROID 60 MG PO TABS
65.0000 mg | ORAL_TABLET | Freq: Every day | ORAL | Status: DC
  Administered 2019-07-16 – 2019-07-17 (×2): 60 mg via ORAL
  Filled 2019-07-15 (×2): qty 1

## 2019-07-15 MED ORDER — ONDANSETRON HCL 4 MG/2ML IJ SOLN
INTRAMUSCULAR | Status: AC
  Filled 2019-07-15: qty 2

## 2019-07-15 MED ORDER — IOHEXOL 300 MG/ML  SOLN
INTRAMUSCULAR | Status: DC | PRN
  Administered 2019-07-15: 205 mL via INTRA_ARTERIAL

## 2019-07-15 MED ORDER — ASPIRIN 81 MG PO CHEW: 81.0000 mg | CHEWABLE_TABLET | ORAL | Status: DC

## 2019-07-15 MED ORDER — NITROGLYCERIN 1 MG/10 ML FOR IR/CATH LAB
INTRA_ARTERIAL | Status: DC | PRN
  Administered 2019-07-15: 200 ug
  Administered 2019-07-15: 200 ug via INTRACORONARY

## 2019-07-15 SURGICAL SUPPLY — 20 items
BALLN TREK RX 2.5X20 (BALLOONS) ×3
BALLOON TREK RX 2.5X20 (BALLOONS) ×1 IMPLANT
CATH INFINITI 5FR ANG PIGTAIL (CATHETERS) ×3 IMPLANT
CATH INFINITI 5FR JL4 (CATHETERS) ×3 IMPLANT
CATH INFINITI JR4 5F (CATHETERS) ×3 IMPLANT
CATH LAUNCHER 6FR EBU 3 (CATHETERS) IMPLANT
CATH VISTA GUIDE 6FR JR4 (CATHETERS) ×3 IMPLANT
CATH VISTA GUIDE 6FR JR4 SH (CATHETERS) ×3 IMPLANT
CATH VISTA GUIDE 6FR MPA1 (CATHETERS) ×3 IMPLANT
CATH VISTA GUIDE 6FR XB3 SH (CATHETERS) ×3 IMPLANT
DEVICE CLOSURE MYNXGRIP 6/7F (Vascular Products) ×3 IMPLANT
DEVICE INFLAT 30 PLUS (MISCELLANEOUS) ×3 IMPLANT
KIT MANI 3VAL PERCEP (MISCELLANEOUS) ×3 IMPLANT
NEEDLE PERC 18GX7CM (NEEDLE) ×3 IMPLANT
PACK CARDIAC CATH (CUSTOM PROCEDURE TRAY) ×3 IMPLANT
SHEATH AVANTI 5FR X 11CM (SHEATH) ×3 IMPLANT
SHEATH AVANTI 6FR X 11CM (SHEATH) ×3 IMPLANT
STENT RESOLUTE ONYX 2.5X22 (Permanent Stent) ×3 IMPLANT
WIRE G HI TQ BMW 190 (WIRE) ×6 IMPLANT
WIRE GUIDERIGHT .035X150 (WIRE) ×3 IMPLANT

## 2019-07-15 NOTE — Progress Notes (Signed)
Dr. Clayborn Bigness in at bedside speaking with pt. And her spouse Mr. Akey re: procedural results. Both agreeable and verbalize understanding of conversation re: PCI.

## 2019-07-15 NOTE — Progress Notes (Signed)
Notified by patient's RN that patient had a short run of Vtach without associated symptoms. Denies chest pain for which she is admitted and currently on heparin gtt. Will repeat BMP+Mag. EKG repeated. Will continue to monitor.    Rufina Falco, DNP, CCRN, FNP-C [facility] Nurse Practitioner

## 2019-07-15 NOTE — ED Provider Notes (Signed)
Patient noted to have a short run of Vtach on telemetry. No pain, A&O x 3, stable BP. Episode resolved with no intervention. Continues on heparin gtt. Normal electrolytes. Rufina Falco informed of this. Will continue to monitor closely.   Alfred Levins, [place], MD 07/15/19 [phone]

## 2019-07-15 NOTE — ED Notes (Signed)
S/o other at bedside changing patient insulin sensor so that patient can continue to regulate her glucose and insulin intake. ABX administered. Covid test resulted. Patient awaiting or.

## 2019-07-15 NOTE — ED Notes (Signed)
Cardiology at bedside. Patient sign informed consent. Awaiting cath status.

## 2019-07-15 NOTE — ED Notes (Signed)
Assumed care of patient aox4, denies cp/sob at present time. VSS heparin infusing at 7.5units. patient awaiting bed status and or further plan of care.

## 2019-07-15 NOTE — Consult Note (Signed)
Cheyenne Lopez is a 69 y.o. female  [id number]  Primary Cardiologist: Neoma Laming Reason for Consultation: Chest pain  HPI: This is a 69 year old [name] with a past medical history of hypertension type 1 diabetes presented to the hospital last night with chest pain described as pressure type associated with shortness of breath and diaphoresis.  Patient will get chest pain on minimal exertion and then was having it at rest thus came to the emergency room.   Review of Systems: No orthopnea PND or leg swelling   Past Medical History:  Diagnosis Date  . Hypertension   . Hypothyroid   . Type 1 diabetes ([place])     (Not in a hospital admission)    . alum & mag hydroxide-simeth  30 mL Oral Once   And  . lidocaine  15 mL Oral Once  . aspirin EC  325 mg Oral Daily  . atorvastatin  20 mg Oral Daily  . cefTRIAXone (ROCEPHIN) IM  1 g Intramuscular Q24H  . chlorhexidine  30 mL Mouth Rinse See admin instructions  . insulin aspart  0-15 Units Subcutaneous Q6H  . insulin pump   Subcutaneous Q4H  . irbesartan  37.5 mg Oral Daily  . latanoprost  1 drop Both Eyes QHS  . sodium chloride flush  3 mL Intravenous Once  . sodium chloride flush  3 mL Intravenous Q12H  . thyroid  60 mg Oral Daily    Infusions: . sodium chloride 100 mL/hr at 07/15/19 0150  . heparin 750 Units/hr (07/14/19 2250)    Allergies  Allergen Reactions  . No Known Allergies     Social History   Socioeconomic History  . Marital status: Married    Spouse name: Not on file  . Number of children: Not on file  . Years of education: Not on file  . Highest education level: Not on file  Occupational History  . Not on file  Tobacco Use  . Smoking status: Never Smoker  . Smokeless tobacco: Never Used  Substance and Sexual Activity  . Alcohol use: No  . Drug use: No  . Sexual activity: Not on file  Other Topics Concern  . Not on file  Social History Narrative  . Not on file   Social  Determinants of Health   Financial Resource Strain:   . Difficulty of Paying Living Expenses:   Food Insecurity:   . Worried About Charity fundraiser in the Last Year:   . Arboriculturist in the Last Year:   Transportation Needs:   . Film/video editor (Medical):   Marland Kitchen Lack of Transportation (Non-Medical):   Physical Activity:   . Days of Exercise per Week:   . Minutes of Exercise per Session:   Stress:   . Feeling of Stress :   Social Connections:   . Frequency of Communication with Friends and Family:   . Frequency of Social Gatherings with Friends and Family:   . Attends Religious Services:   . Active Member of Clubs or Organizations:   . Attends Archivist Meetings:   Marland Kitchen Marital Status:   Intimate Partner Violence:   . Fear of Current or Ex-Partner:   . Emotionally Abused:   Marland Kitchen Physically Abused:   . Sexually Abused:     Family History  Problem Relation Age of Onset  . Breast cancer Neg Hx     PHYSICAL EXAM: Vitals:   07/15/19 0743 07/15/19 0747  BP: Marland Kitchen)  106/35 (!) 106/35  Pulse:  (!) 56  Resp: 14 17  Temp:  98.6 F (37 C)  SpO2:  99%    No intake or output data in the 24 hours ending 07/15/19 0852  General:  Well appearing. No respiratory difficulty HEENT: normal Neck: supple. no JVD. Carotids 2+ bilat; no bruits. No lymphadenopathy or thryomegaly appreciated. Cor: PMI nondisplaced. Regular rate & rhythm. No rubs, gallops or murmurs. Lungs: clear Abdomen: soft, nontender, nondistended. No hepatosplenomegaly. No bruits or masses. Good bowel sounds. Extremities: no cyanosis, clubbing, rash, edema Neuro: alert & oriented x 3, cranial nerves grossly intact. moves all 4 extremities w/o difficulty. Affect pleasant.  ECG: Sinus rhythm with ST depression inferolaterally questionable Q wave in lateral leads  Results for orders placed or performed during the hospital encounter of 07/14/19 (from the past 24 hour(s))  Basic metabolic panel     Status:  Abnormal   Collection Time: 07/14/19  8:42 PM  Result Value Ref Range   Sodium 139 135 - 145 mmol/L   Potassium 4.2 3.5 - 5.1 mmol/L   Chloride 108 98 - 111 mmol/L   CO2 24 22 - 32 mmol/L   Glucose, Bld 221 (H) 70 - 99 mg/dL   BUN 18 8 - 23 mg/dL   Creatinine, Ser 0.74 0.44 - 1.00 mg/dL   Calcium 8.8 (L) 8.9 - 10.3 mg/dL   GFR calc non Af Amer >60 >60 mL/min   GFR calc Af Amer >60 >60 mL/min   Anion gap 7 5 - 15  CBC     Status: Abnormal   Collection Time: 07/14/19  8:42 PM  Result Value Ref Range   WBC 6.0 4.0 - 10.5 K/uL   RBC 3.39 (L) 3.87 - 5.11 MIL/uL   Hemoglobin 11.3 (L) 12.0 - 15.0 g/dL   HCT 32.6 (L) 36.0 - 46.0 %   MCV 96.2 80.0 - 100.0 fL   MCH 33.3 26.0 - 34.0 pg   MCHC 34.7 30.0 - 36.0 g/dL   RDW 12.3 11.5 - 15.5 %   Platelets 244 150 - 400 K/uL   nRBC 0.0 0.0 - 0.2 %  Troponin I (High Sensitivity)     Status: Abnormal   Collection Time: 07/14/19  8:42 PM  Result Value Ref Range   Troponin I (High Sensitivity) 23 (H) <18 ng/L  APTT     Status: None   Collection Time: 07/14/19 10:04 PM  Result Value Ref Range   aPTT 33 24 - 36 seconds  Protime-INR     Status: None   Collection Time: 07/14/19 10:04 PM  Result Value Ref Range   Prothrombin Time 13.4 11.4 - 15.2 seconds   INR 1.0 0.8 - 1.2  Troponin I (High Sensitivity)     Status: Abnormal   Collection Time: 07/14/19 10:58 PM  Result Value Ref Range   Troponin I (High Sensitivity) 145 (HH) <18 ng/L  TSH     Status: None   Collection Time: 07/14/19 10:58 PM  Result Value Ref Range   TSH 0.735 0.350 - 4.500 uIU/mL  Glucose, capillary     Status: Abnormal   Collection Time: 07/15/19  1:57 AM  Result Value Ref Range   Glucose-Capillary 268 (H) 70 - 99 mg/dL  Heparin level (unfractionated)     Status: None   Collection Time: 07/15/19  6:32 AM  Result Value Ref Range   Heparin Unfractionated 0.69 0.30 - 0.70 IU/mL  Troponin I (High Sensitivity)     Status:  Abnormal   Collection Time: 07/15/19  6:32 AM   Result Value Ref Range   Troponin I (High Sensitivity) 11,794 (HH) <18 ng/L  Basic metabolic panel     Status: Abnormal   Collection Time: 07/15/19  6:32 AM  Result Value Ref Range   Sodium 140 135 - 145 mmol/L   Potassium 3.8 3.5 - 5.1 mmol/L   Chloride 112 (H) 98 - 111 mmol/L   CO2 24 22 - 32 mmol/L   Glucose, Bld 187 (H) 70 - 99 mg/dL   BUN 18 8 - 23 mg/dL   Creatinine, Ser 0.59 0.44 - 1.00 mg/dL   Calcium 8.3 (L) 8.9 - 10.3 mg/dL   GFR calc non Af Amer >60 >60 mL/min   GFR calc Af Amer >60 >60 mL/min   Anion gap 4 (L) 5 - 15  Magnesium     Status: None   Collection Time: 07/15/19  6:32 AM  Result Value Ref Range   Magnesium 2.2 1.7 - 2.4 mg/dL  Glucose, capillary     Status: Abnormal   Collection Time: 07/15/19  7:02 AM  Result Value Ref Range   Glucose-Capillary 161 (H) 70 - 99 mg/dL   DG Chest 2 View  Result Date: 07/14/2019 CLINICAL DATA:  Chest pain EXAM: CHEST - 2 VIEW COMPARISON:  09/16/2012 FINDINGS: The heart size and mediastinal contours are within normal limits. Both lungs are clear. The visualized skeletal structures are unremarkable. IMPRESSION: No active cardiopulmonary disease. Electronically Signed   By: Donavan Foil M.D.   On: 07/14/2019 20:53     ASSESSMENT AND PLAN: Non-STEMI with ST depression inferolaterally questionable Q waves in the lateral leads which is changed from prior EKGs with 11,000 troponin.  Patient was supposed to have cardiac catheterization right now but because of Covid test not being available yet waiting for the results and then patient will be brought to the cardiac catheterization lab.  Patient was explained risk and benefits patient agreed to the procedure and patient was explained that patient is having non-STEMI.  Pilot Prindle A

## 2019-07-15 NOTE — ED Notes (Signed)
Or nurses at bedside to pick patient up. S/O to follow.

## 2019-07-15 NOTE — ED Notes (Signed)
Report to OR nurse patient to go to cath lab pending covid.

## 2019-07-15 NOTE — Progress Notes (Addendum)
Pt has an insulin scheduled every 4 hours on MARS, but pt has only blood sugar check schedule at 2245. Notify B Morrison. Will continue to monitor.  Update 2154: B Randol Kern  (NP)states will place order. Will continue to monitor.  Update 2014: Pts has a scheduled novolog at 2245, but pts refusing the novolog and states she will cover it using her insulin pump. Notify NP Randol Kern. Will cotninue to monitor.  Update 2030Randol Kern NP discontinue novolog. Pt signed the insulin pump contract and placed on contract. Will continue to monitor.  Update 2032: Diabetic coordinator was paged at [phone]. Awaiting callback. Will continue to monitor.

## 2019-07-15 NOTE — Progress Notes (Signed)
ANTICOAGULATION CONSULT NOTE - Initial Consult  Pharmacy Consult for Heparin  Indication: chest pain/ACS  Allergies  Allergen Reactions  . No Known Allergies     Patient Measurements: Height: 5\' 1"  (154.9 cm) Weight: 68.5 kg (151 lb) IBW/kg (Calculated) : 47.8 Heparin Dosing Weight: 62.4 kg   Vital Signs: Temp: 97.9 F (36.6 C) (04/20 2030) Temp Source: Oral (04/20 2030) BP: 106/72 (04/21 0645) Pulse Rate: 51 (04/21 0645)  Labs: Recent Labs    07/14/19 2042 07/14/19 2204 07/14/19 2258 07/15/19 0632  HGB 11.3*  --   --   --   HCT 32.6*  --   --   --   PLT 244  --   --   --   APTT  --  33  --   --   LABPROT  --  13.4  --   --   INR  --  1.0  --   --   HEPARINUNFRC  --   --   --  0.69  CREATININE 0.74  --   --  0.59  TROPONINIHS 23*  --  145* 11,794*    Estimated Creatinine Clearance: 59.6 mL/min (by C-G formula based on SCr of 0.59 mg/dL).   Medical History: Past Medical History:  Diagnosis Date  . Hypertension   . Hypothyroid   . Type 1 diabetes [facility])    Assessment: Pharmacy consulted to dose heparin in this 69 year old female admitted with ACS/NSTEMI.  CrCl = 59.6 ml/min  No prior anticoag noted.   0421 [id number]  HL 0.69  Goal of Therapy:  Heparin level 0.3-0.7 units/ml Monitor platelets by anticoagulation protocol: Yes   Plan:  HL = 0.69 - therapeutic x 1 Continue heparin infusion at 750 units/hr Check anti-Xa level in 6 hours and daily while on heparin Continue to monitor H&H and platelets  Lu Duffel, PharmD, BCPS Clinical Pharmacist 07/15/2019 7:43 AM

## 2019-07-15 NOTE — Progress Notes (Signed)
PROGRESS NOTE    Patient: Cheyenne Lopez                            PCP: Cheyenne Castle, MD                    DOB: 1951/03/23            DOA: 07/14/2019 [id number]             DOS: 07/15/2019, 10:54 AM   LOS: 0 days   Date of Service: The patient was seen and examined on 07/15/2019  Subjective:   The patient was seen and examined this morning, on heparin drip, currently denies any chest pain. Or shortness of breath. Patient was admitted for chest pain, markedly elevated troponin, consistent with non-STEMI Patient has been already spoke to cardiology, planning to take the patient to Cath Lab this morning. All patient questions and husband at bedside was discussed in detail  Brief Narrative:   14-yo [name] with PMH/o DM type I-on insulin pump, hypothyroidism, HTN, Presented with chief complaint of chest pain, nonspecific EKG here, bradycardia heart rate 49/50, biphasic T wave normality noted.  Patient ordered for non-STEMI Cardiology Dr. Humphrey Rolls consulted.  Patient started on heparin drip-planning for cardiac catheterization today 07/15/2019  Assessment & Plan:   Active Problems:   Chest pain   NSTEMI (non-ST elevated myocardial infarction) (HCC)  Non-STEMI/ Chest pain, -ACS was ruled in, abnormal EKG (negative ST elevation or depression),  elevated troponin 23 >> 14 >> 11,794 -On heparin drip -Cardiology Dr. Humphrey Rolls consulted, patient remained n.p.o., going for left heart catheterization later today.  rule out acute coronary syndrome, with abnormal EKG. -Continue supportive therapy with  enteric-coated aspirin as well as as needed sublingual nitroglycerin and morphine sulfate for pain. -We will continue her on IV heparin drip. -2D echo >>> .   2. Type 1 diabetes mellitus. -We will place on supplemental coverage with NovoLog and continue her basal coverage when medication reconciliation is complete. -She can continue on her home insulin pump.  Her husband was  going home to get its reservoir. -Basal coverage with long-acting insulin can be continued at half home dose when she is n.p.o.  3. Hypothyroidism. -We will continue her Armour Thyroid and check TSH level.  4. Dyslipidemia. -We will continue statin therapy and check fasting lipids.  5. Hypertension. -We will continue her Diovan.  6. Glaucoma. -We will continue her ophthalmic GTT.  7.   Possible UTI  -abnormal urine findings leukocyte Estrace, WBCs -Doing empiric IV antibiotics of Rocephin -We will follow with urine cultures accordingly   ---------------------------------------------------------------------------------------------------------------------------  DVT prophylaxis: On heparin drip Code Status:   Code Status: Full Code Family Communication: No family member present at bedside- attempt will be made to update daily The above findings and plan of care has been discussed with patient (patient's husband)  in detail,  they expressed understanding and agreement of above. -Advance care planning has been discussed.... Remains full code  Admission status:   Status is: Inpatient  Remains inpatient appropriate because:Ongoing diagnostic testing needed not appropriate for outpatient work up   Dispo: The patient is from: Home              Anticipated d/c is to: Home              Anticipated d/c date is: 2 days  Patient currently is not medically stable to d/c.  Due to having non-STEMI, planning for  further intervention, needing IV heparin    Consultants: Cardiology Dr. Humphrey Rolls  Procedures:     Cardiac catheterization today 07/15/2019  Antimicrobials:  Anti-infectives (From admission, onward)   Start     Dose/Rate Route Frequency Ordered Stop   07/15/19 0900  [MAR Hold]  cefTRIAXone (ROCEPHIN) 1 g in sodium chloride 0.9 % 100 mL IVPB     (MAR Hold since Wed 07/15/2019 at 0945.Hold Reason: Transfer to a Procedural area.)   1 g 200 mL/hr over 30 Minutes  Intravenous Every 24 hours 07/15/19 0857     07/15/19 0845  cefTRIAXone (ROCEPHIN) injection 1 g  Status:  Discontinued     1 g Intramuscular Every 24 hours 07/15/19 0834 07/15/19 0857       Medication:  . [MAR Hold] alum & mag hydroxide-simeth  30 mL Oral Once   And  . [MAR Hold] lidocaine  15 mL Oral Once  . [MAR Hold] aspirin EC  325 mg Oral Daily  . [MAR Hold] atorvastatin  20 mg Oral Daily  . [MAR Hold] chlorhexidine  30 mL Mouth Rinse See admin instructions  . [MAR Hold] insulin aspart  0-15 Units Subcutaneous Q6H  . [MAR Hold] insulin pump   Subcutaneous Q4H  . [MAR Hold] irbesartan  37.5 mg Oral Daily  . [MAR Hold] latanoprost  1 drop Both Eyes QHS  . [MAR Hold] sodium chloride flush  3 mL Intravenous Once  . [MAR Hold] sodium chloride flush  3 mL Intravenous Q12H  . [MAR Hold] thyroid  60 mg Oral Daily    [MAR Hold] acetaminophen, [MAR Hold] ALPRAZolam, bivalirudin (ANGIOMAX) infusion 5 mg/mL, bivalirudin, fentaNYL, Heparin (Porcine) in NaCl, midazolam, [MAR Hold]  morphine injection, [MAR Hold] nitroGLYCERIN, [MAR Hold] ondansetron (ZOFRAN) IV, [MAR Hold] oxyCODONE-acetaminophen, ticagrelor   Objective:   Vitals:   07/15/19 0757 07/15/19 0900 07/15/19 0930 07/15/19 1001  BP: (!) 100/27 122/69 124/66   Pulse: (!) 53 (!) 56 (!) 55   Resp: 18 20 18    Temp:   98.5 F (36.9 C)   TempSrc:   Oral   SpO2: 100% 100% 100% 100%  Weight:   68.5 kg   Height:   5\' 1"  (1.549 m)    No intake or output data in the 24 hours ending 07/15/19 1054 Filed Weights   07/14/19 2030 07/15/19 0930  Weight: 68.5 kg 68.5 kg     Examination:   Physical Exam  Constitution:  Alert, cooperative, no distress,  Appears calm and comfortable  Psychiatric: Normal and stable mood and affect, cognition intact,   HEENT: Normocephalic, PERRL, otherwise with in Normal limits  Chest:Chest symmetric Cardio vascular:  S1/S2, RRR, No murmure, No Rubs or Gallops  pulmonary: Clear to auscultation  bilaterally, respirations unlabored, negative wheezes / crackles Abdomen: Soft, non-tender, non-distended, bowel sounds,no masses, no organomegaly Muscular skeletal: Limited exam - in bed, able to move all 4 extremities, Normal strength,  Neuro: CNII-XII intact. , normal motor and sensation, reflexes intact  Extremities: No pitting edema lower extremities, +2 pulses  Skin: Dry, warm to touch, negative for any Rashes, No open wounds Wounds: per nursing documentation               LABs:  CBC Latest Ref Rng & Units 07/14/2019 06/23/2019  WBC 4.0 - 10.5 K/uL 6.0 8.5  Hemoglobin 12.0 - 15.0 g/dL 11.3(L) 12.4  Hematocrit 36.0 - 46.0 % 32.6(L)  37.3  Platelets 150 - 400 K/uL 244 191   CMP Latest Ref Rng & Units 07/15/2019 07/14/2019 06/23/2019  Glucose 70 - 99 mg/dL 187(H) 221(H) 164(H)  BUN 8 - 23 mg/dL 18 18 11   Creatinine 0.44 - 1.00 mg/dL 0.59 0.74 0.69  Sodium 135 - 145 mmol/L 140 139 140  Potassium 3.5 - 5.1 mmol/L 3.8 4.2 4.4  Chloride 98 - 111 mmol/L 112(H) 108 104  CO2 22 - 32 mmol/L 24 24 26   Calcium 8.9 - 10.3 mg/dL 8.3(L) 8.8(L) 9.5        SIGNED: Deatra James, MD, FACP, FHM. [facility],  Pager [phone] (please [email or web address] to page/text)  If 7PM-7AM, please contact night-coverage [email or web address] [facility] 07/15/2019, 10:54 AM

## 2019-07-16 DIAGNOSIS — N390 Urinary tract infection, site not specified: Secondary | ICD-10-CM | POA: Diagnosis present

## 2019-07-16 DIAGNOSIS — E109 Type 1 diabetes mellitus without complications: Secondary | ICD-10-CM

## 2019-07-16 DIAGNOSIS — E1159 Type 2 diabetes mellitus with other circulatory complications: Secondary | ICD-10-CM | POA: Diagnosis present

## 2019-07-16 LAB — URINALYSIS, ROUTINE W REFLEX MICROSCOPIC
Bacteria, UA: NONE SEEN
Bilirubin Urine: NEGATIVE
Glucose, UA: >=500 mg/dL — AB
Hgb urine dipstick: NEGATIVE
Ketones, ur: NEGATIVE mg/dL
Nitrite: NEGATIVE
Protein, ur: NEGATIVE mg/dL
Specific Gravity, Urine: 1.015 (ref 1.005–1.030)
pH: 5 (ref 5.0–8.0)

## 2019-07-16 LAB — GLUCOSE, CAPILLARY
Glucose-Capillary: 112 mg/dL — ABNORMAL HIGH (ref 70–99)
Glucose-Capillary: 116 mg/dL — ABNORMAL HIGH (ref 70–99)
Glucose-Capillary: 134 mg/dL — ABNORMAL HIGH (ref 70–99)
Glucose-Capillary: 138 mg/dL — ABNORMAL HIGH (ref 70–99)
Glucose-Capillary: 142 mg/dL — ABNORMAL HIGH (ref 70–99)
Glucose-Capillary: 206 mg/dL — ABNORMAL HIGH (ref 70–99)
Glucose-Capillary: 258 mg/dL — ABNORMAL HIGH (ref 70–99)

## 2019-07-16 LAB — CBC
HCT: 29.6 % — ABNORMAL LOW (ref 36.0–46.0)
Hemoglobin: 10 g/dL — ABNORMAL LOW (ref 12.0–15.0)
MCH: 33 pg (ref 26.0–34.0)
MCHC: 33.8 g/dL (ref 30.0–36.0)
MCV: 97.7 fL (ref 80.0–100.0)
Platelets: 162 10*3/uL (ref 150–400)
RBC: 3.03 MIL/uL — ABNORMAL LOW (ref 3.87–5.11)
RDW: 12.9 % (ref 11.5–15.5)
WBC: 6 10*3/uL (ref 4.0–10.5)
nRBC: 0 % (ref 0.0–0.2)

## 2019-07-16 LAB — LIPID PANEL
Cholesterol: 135 mg/dL (ref 0–200)
HDL: 45 mg/dL (ref >40)
LDL Cholesterol: 79 mg/dL (ref 0–99)
Total CHOL/HDL Ratio: 3 RATIO
Triglycerides: 55 mg/dL (ref <150)
VLDL: 11 mg/dL (ref 0–40)

## 2019-07-16 LAB — BASIC METABOLIC PANEL
Anion gap: 6 (ref 5–15)
BUN: 9 mg/dL (ref 8–23)
CO2: 25 mmol/L (ref 22–32)
Calcium: 8.3 mg/dL — ABNORMAL LOW (ref 8.9–10.3)
Chloride: 112 mmol/L — ABNORMAL HIGH (ref 98–111)
Creatinine, Ser: 0.62 mg/dL (ref 0.44–1.00)
GFR calc Af Amer: >60 mL/min (ref >60)
GFR calc non Af Amer: >60 mL/min (ref >60)
Glucose, Bld: 142 mg/dL — ABNORMAL HIGH (ref 70–99)
Potassium: 3.9 mmol/L (ref 3.5–5.1)
Sodium: 143 mmol/L (ref 135–145)

## 2019-07-16 LAB — TROPONIN I (HIGH SENSITIVITY): Troponin I (High Sensitivity): 12794 ng/L (ref <18)

## 2019-07-16 LAB — ECHOCARDIOGRAM COMPLETE
Height: 61 in
Weight: 2416.24 oz

## 2019-07-16 LAB — HIV ANTIBODY (ROUTINE TESTING W REFLEX): HIV Screen 4th Generation wRfx: NONREACTIVE

## 2019-07-16 LAB — HEMOGLOBIN A1C
Hgb A1c MFr Bld: 6.9 % — ABNORMAL HIGH (ref 4.8–5.6)
Mean Plasma Glucose: 151.33 mg/dL

## 2019-07-16 MED ORDER — TICAGRELOR 90 MG PO TABS: 90.0000 mg | ORAL_TABLET | Freq: Two times a day (BID) | ORAL | 3 refills | Status: DC

## 2019-07-16 MED ORDER — ATORVASTATIN CALCIUM 80 MG PO TABS: 80.0000 mg | ORAL_TABLET | Freq: Every day | ORAL | 0 refills | Status: DC

## 2019-07-16 MED ORDER — ATORVASTATIN CALCIUM 20 MG PO TABS: 20.0000 mg | ORAL_TABLET | Freq: Every day | ORAL | 3 refills | Status: DC

## 2019-07-16 MED ORDER — POLYETHYLENE GLYCOL 3350 17 G PO PACK
17.0000 g | PACK | Freq: Every day | ORAL | Status: DC
  Administered 2019-07-16 – 2019-07-17 (×2): 17 g via ORAL
  Filled 2019-07-16 (×2): qty 1

## 2019-07-16 MED ORDER — NITROGLYCERIN 0.4 MG SL SUBL: 0.4000 mg | SUBLINGUAL_TABLET | SUBLINGUAL | 12 refills | Status: AC | PRN

## 2019-07-16 MED ORDER — ATORVASTATIN CALCIUM 80 MG PO TABS
80.0000 mg | ORAL_TABLET | Freq: Every day | ORAL | Status: DC
  Filled 2019-07-16: qty 1

## 2019-07-16 MED ORDER — ASPIRIN 81 MG PO CHEW: 81.0000 mg | CHEWABLE_TABLET | Freq: Every day | ORAL | 0 refills | Status: AC

## 2019-07-16 MED ORDER — OXYCODONE-ACETAMINOPHEN 5-325 MG PO TABS: 1.0000 | ORAL_TABLET | Freq: Three times a day (TID) | ORAL | 0 refills | Status: DC | PRN

## 2019-07-16 NOTE — Progress Notes (Signed)
SUBJECTIVE: Patient doing well.  Denies chest pain or shortness of breath.   Vitals:   07/16/19 0514 07/16/19 0555 07/16/19 0825 07/16/19 0937  BP: 109/66  100/72   Pulse: 66  (!) 50 77  Resp:   18   Temp:  98.7 F (37.1 C) 98.4 F (36.9 C)   TempSrc:  Oral    SpO2: 100%  99%   Weight:      Height:        Intake/Output Summary (Last 24 hours) at 07/16/2019 1200 Last data filed at 07/16/2019 0945 Gross per 24 hour  Intake 1948.27 ml  Output 1200 ml  Net 748.27 ml    LABS: Basic Metabolic Panel: Recent Labs    07/15/19 0632 07/16/19 0523  NA 140 143  K 3.8 3.9  CL 112* 112*  CO2 24 25  GLUCOSE 187* 142*  BUN 18 9  CREATININE 0.59 0.62  CALCIUM 8.3* 8.3*  MG 2.2  --    Liver Function Tests: No results for input(s): AST, ALT, ALKPHOS, BILITOT, PROT, ALBUMIN in the last 72 hours. No results for input(s): LIPASE, AMYLASE in the last 72 hours. CBC: Recent Labs    07/14/19 2042 07/16/19 0523  WBC 6.0 6.0  HGB 11.3* 10.0*  HCT 32.6* 29.6*  MCV 96.2 97.7  PLT 244 162   Cardiac Enzymes: No results for input(s): CKTOTAL, CKMB, CKMBINDEX, TROPONINI in the last 72 hours. BNP: Invalid input(s): POCBNP D-Dimer: No results for input(s): DDIMER in the last 72 hours. Hemoglobin A1C: Recent Labs    07/16/19 0523  HGBA1C 6.9*   Fasting Lipid Panel: Recent Labs    07/16/19 0523  CHOL 135  HDL 45  LDLCALC 79  TRIG 55  CHOLHDL 3.0   Thyroid Function Tests: Recent Labs    07/14/19 2258  TSH 0.735   Anemia Panel: No results for input(s): VITAMINB12, FOLATE, FERRITIN, TIBC, IRON, RETICCTPCT in the last 72 hours.   PHYSICAL EXAM General: Well developed, well nourished, in no acute distress HEENT:  Normocephalic and atramatic Neck:  No JVD.  Lungs: Clear bilaterally to auscultation and percussion. Heart: HRRR . Normal S1 and S2 without gallops or murmurs.  Abdomen: Bowel sounds are positive, abdomen soft and non-tender  Msk:  Back normal, normal gait.  Normal strength and tone for age. Extremities: No clubbing, cyanosis or edema.   Neuro: Alert and oriented X 3. Psych:  Good affect, responds appropriately  TELEMETRY: 70s/bpm  ASSESSMENT AND PLAN: Patient alert initially presenting to the emergency department with chest pain.  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  A DES was placed in her mid circumflex and the patient was started on dual antiplatelet therapy of Brilinta and aspirin.  Patient continues to have uptrending troponin which we will just monitor.  Will change the patient to a high intensity statin.  From a cardiac point of view with successful placement of the DES in her mid circumflex and resolution of chest pain and shortness of breath patient is stable for discharge.  We will see the patient in our office at [facility] on April 27 at 1115AM.   Principal Problem:   NSTEMI (non-ST elevated myocardial infarction) [facility]) Active Problems:   Chest pain   Acute lower UTI   Hypertension complicating diabetes ([place])   Type 1 diabetes mellitus without complication ([place])    Adaline Sill, MD, [facility] 07/16/2019 12:00 PM

## 2019-07-16 NOTE — Plan of Care (Signed)

## 2019-07-16 NOTE — Progress Notes (Signed)
PROGRESS NOTE    Patient: Cheyenne Lopez                            PCP: Valera Castle, MD                    DOB: 05-05-1950            DOA: 07/14/2019 [id number]             DOS: 07/16/2019, 10:32 AM   LOS: 1 day   Date of Service: The patient was seen and examined on 07/16/2019  Subjective:   The patient was seen and examined this morning, stable complaining of a very mild chest pain. Status post left cardiac cath, with PCI and stenting to RCA. Patient tolerated the procedure well. No further complaints this morning.  Brief Narrative:   25-yo [name] with PMH/o DM type I-on insulin pump, hypothyroidism, HTN, Presented with chief complaint of chest pain, nonspecific EKG here, bradycardia heart rate 49/50, biphasic T wave normality noted.  Patient ordered for non-STEMI Cardiology Dr. Humphrey Rolls consulted.   Status post treatment with IV heparin, status post left cardiac catheterization, with PCI and stenting to RCA. Patient started on dual antiplatelet therapy, Brilinta and aspirin.   Assessment & Plan:   Active Problems:   Chest pain   NSTEMI (non-ST elevated myocardial infarction) (HCC)  Non-STEMI/ Chest pain, -status post left cardiac catheterization, with PCI and stenting to RCA Yesterday 07/15/2019 -Patient started on dual antiplatelet therapy, Brilinta and aspirin.   -ACS was ruled in, abnormal EKG (negative ST elevation or depression),  elevated troponin 23 >> 14 >> 11,794, 12,794 -On heparin drip--DC'd -Cardiology Dr. Humphrey Rolls . -Continue supportive therapy with  enteric-coated aspirin as well as as needed sublingual nitroglycerin and morphine sulfate for pain. -We will continue her on IV heparin drip. -2D echo >>> .   Type 1 diabetes mellitus. A1c 6.9 -We will place on supplemental coverage with NovoLog and continue her basal coverage when  -She can continue on her home insulin pump. -Restarting basal insulin.   Hypothyroidism. -We will  continue her Armour  -TSH level; 0.73  Dyslipidemia. -Continue statins -LDL 79,  HDL 45, total cholesterol 135 triglyceride 55 .   Hypertension. -We will continue her Diovan.   Glaucoma. -We will continue her ophthalmic GTT.   Possible UTI  -abnormal urine findings leukocyte Estrace, WBCs -Doing empiric IV antibiotics of Rocephin -We will follow with urine cultures accordingly   ---------------------------------------------------------------------------------------------------------------------------  DVT prophylaxis: On heparin drip Code Status:   Code Status: Full Code Family Communication: No family member present at bedside- attempt will be made to update daily The above findings and plan of care has been discussed with patient (patient's husband)  in detail,  they expressed understanding and agreement of above. -Advance care planning has been discussed.... Remains full code  Admission status:   Status is: Inpatient  Remains inpatient appropriate because:Ongoing diagnostic testing needed not appropriate for outpatient work up   Dispo: The patient is from: Home              Anticipated d/c is to: Home              Anticipated d/c date is: Likely in a.m.              Patient currently is not medically stable to d/c.  S/p  non-STEMI, S/p left cardiac cath PCI  stent  placement--- still having some chest discomfort, monitoring today's procedure   Consultants: Cardiology Dr. Humphrey Rolls  Procedures:     Cardiac catheterization today 07/15/2019  Antimicrobials:  Anti-infectives (From admission, onward)   Start     Dose/Rate Route Frequency Ordered Stop   07/15/19 0900  cefTRIAXone (ROCEPHIN) 1 g in sodium chloride 0.9 % 100 mL IVPB     1 g 200 mL/hr over 30 Minutes Intravenous Every 24 hours 07/15/19 0857     07/15/19 0845  cefTRIAXone (ROCEPHIN) injection 1 g  Status:  Discontinued     1 g Intramuscular Every 24 hours 07/15/19 0834 07/15/19 0857        Medication:  . alum & mag hydroxide-simeth  30 mL Oral Once   And  . lidocaine  15 mL Oral Once  . aspirin  81 mg Oral Daily  . atorvastatin  20 mg Oral Daily  . chlorhexidine  30 mL Mouth Rinse See admin instructions  . insulin pump   Subcutaneous Q4H  . irbesartan  37.5 mg Oral Daily  . latanoprost  1 drop Both Eyes QHS  . polyethylene glycol  17 g Oral Daily  . sodium chloride flush  3 mL Intravenous Once  . sodium chloride flush  3 mL Intravenous Q12H  . sodium chloride flush  3 mL Intravenous Q12H  . sodium chloride flush  3 mL Intravenous Q12H  . thyroid  60 mg Oral Daily  . ticagrelor  90 mg Oral BID    sodium chloride, sodium chloride, acetaminophen, ALPRAZolam, morphine injection, nitroGLYCERIN, ondansetron (ZOFRAN) IV, oxyCODONE-acetaminophen, sodium chloride flush, sodium chloride flush   Objective:   Vitals:   07/16/19 0514 07/16/19 0555 07/16/19 0825 07/16/19 0937  BP: 109/66  100/72   Pulse: 66  (!) 50 77  Resp:   18   Temp:  98.7 F (37.1 C) 98.4 F (36.9 C)   TempSrc:  Oral    SpO2: 100%  99%   Weight:      Height:        Intake/Output Summary (Last 24 hours) at 07/16/2019 1032 Last data filed at 07/16/2019 0700 Gross per 24 hour  Intake 1708.27 ml  Output 1200 ml  Net 508.27 ml   Filed Weights   07/14/19 2030 07/15/19 0930 07/16/19 0509  Weight: 68.5 kg 68.5 kg 69.4 kg     Examination:  BP 100/72 (BP Location: Right Arm)   Pulse 77   Temp 98.4 F (36.9 C)   Resp 18   Ht 5\' 1"  (1.549 m)   Wt 69.4 kg   SpO2 99%   BMI 28.89 kg/m    Physical Exam  Constitution:  Alert, cooperative, no distress,  Psychiatric: Normal and stable mood and affect, cognition intact,   HEENT: Normocephalic, PERRL, otherwise with in Normal limits  Chest:Chest symmetric Cardio vascular:  S1/S2, RRR, No murmure, No Rubs or Gallops  pulmonary: Clear to auscultation bilaterally, respirations unlabored, negative wheezes / crackles Abdomen: Soft, non-tender,  non-distended, bowel sounds,no masses, no organomegaly Muscular skeletal: Limited exam - in bed, able to move all 4 extremities, Normal strength,  Neuro: CNII-XII intact. , normal motor and sensation, reflexes intact  Extremities: No pitting edema lower extremities, +2 pulses  Skin: Dry, warm to touch, negative for any Rashes, No open wounds Wounds: per nursing documentation                 LABs:  CBC Latest Ref Rng & Units 07/16/2019 07/14/2019 06/23/2019  WBC 4.0 -  10.5 K/uL 6.0 6.0 8.5  Hemoglobin 12.0 - 15.0 g/dL 10.0(L) 11.3(L) 12.4  Hematocrit 36.0 - 46.0 % 29.6(L) 32.6(L) 37.3  Platelets 150 - 400 K/uL 162 244 191   CMP Latest Ref Rng & Units 07/16/2019 07/15/2019 07/14/2019  Glucose 70 - 99 mg/dL 142(H) 187(H) 221(H)  BUN 8 - 23 mg/dL 9 18 18   Creatinine 0.44 - 1.00 mg/dL 0.62 0.59 0.74  Sodium 135 - 145 mmol/L 143 140 139  Potassium 3.5 - 5.1 mmol/L 3.9 3.8 4.2  Chloride 98 - 111 mmol/L 112(H) 112(H) 108  CO2 22 - 32 mmol/L 25 24 24   Calcium 8.9 - 10.3 mg/dL 8.3(L) 8.3(L) 8.8(L)        SIGNED: Deatra James, MD, FACP, FHM. [facility],  Pager [phone] (please [email or web address] to page/text)  If 7PM-7AM, please contact night-coverage [email or web address] Ota [facility] 07/16/2019, 10:32 AM

## 2019-07-17 LAB — CBC
HCT: 29.3 % — ABNORMAL LOW (ref 36.0–46.0)
Hemoglobin: 9.6 g/dL — ABNORMAL LOW (ref 12.0–15.0)
MCH: 32.8 pg (ref 26.0–34.0)
MCHC: 32.8 g/dL (ref 30.0–36.0)
MCV: 100 fL (ref 80.0–100.0)
Platelets: 145 10*3/uL — ABNORMAL LOW (ref 150–400)
RBC: 2.93 MIL/uL — ABNORMAL LOW (ref 3.87–5.11)
RDW: 12.7 % (ref 11.5–15.5)
WBC: 5.8 10*3/uL (ref 4.0–10.5)
nRBC: 0 % (ref 0.0–0.2)

## 2019-07-17 LAB — BASIC METABOLIC PANEL
Anion gap: 6 (ref 5–15)
BUN: 7 mg/dL — ABNORMAL LOW (ref 8–23)
CO2: 27 mmol/L (ref 22–32)
Calcium: 8.4 mg/dL — ABNORMAL LOW (ref 8.9–10.3)
Chloride: 109 mmol/L (ref 98–111)
Creatinine, Ser: 0.64 mg/dL (ref 0.44–1.00)
GFR calc Af Amer: >60 mL/min (ref >60)
GFR calc non Af Amer: >60 mL/min (ref >60)
Glucose, Bld: 147 mg/dL — ABNORMAL HIGH (ref 70–99)
Potassium: 3.7 mmol/L (ref 3.5–5.1)
Sodium: 142 mmol/L (ref 135–145)

## 2019-07-17 LAB — URINE CULTURE: Culture: NO GROWTH

## 2019-07-17 LAB — GLUCOSE, CAPILLARY
Glucose-Capillary: 143 mg/dL — ABNORMAL HIGH (ref 70–99)
Glucose-Capillary: 147 mg/dL — ABNORMAL HIGH (ref 70–99)

## 2019-07-17 NOTE — Care Management Important Message (Signed)
Important Message  Patient Details  Name: Cheyenne Lopez MRN: [id number] Date of Birth: [birth year]   Medicare Important Message Given:  N/A - LOS <3 / Initial given by admissions     Dannette Barbara 07/17/2019, 1:03 PM

## 2019-07-17 NOTE — Progress Notes (Signed)
Written and verbal discharge instructions discussed with pt and spouse. Discussed medication, follow-up appts, post cath care and when to call MD or return to ER. Pt verbalized understanding. IV and tele dc'd. Belongings with pt. Pt to car via wheelchair by RN where spouse was waiting.

## 2019-07-17 NOTE — Discharge Summary (Signed)
Physician Discharge Summary [facility]    Patient: Cheyenne Lopez                   Admit date: 07/14/2019   DOB: 1950-07-24             Discharge date:07/17/2019/10:00 AM WUJ:[id number]                          PCP: Valera Castle, MD  Disposition: Home  Recommendations for Outpatient Follow-up:   . Follow up: In 1 week . Follow-up with your cardiologist [facility] on April 27at 1115AM.  Discharge Condition: Stable   Code Status:   Code Status: Full Code  Diet recommendation: Diabetic diet   Discharge Diagnoses:    Principal Problem:   NSTEMI (non-ST elevated myocardial infarction) ([place]) Active Problems:   Chest pain   Acute lower UTI   Hypertension complicating diabetes ([place])   Type 1 diabetes mellitus without complication ([place])   History of Present Illness/ Hospital Course Kathleen Argue Summary:     59-yo [name] with PMH/o DM type I-on insulin pump, hypothyroidism, HTN, Presented with chief complaint of chest pain, nonspecific EKG here, bradycardia heart rate 49/50, biphasic T wave normality noted.  Patient ordered for non-STEMI Cardiology Dr. Humphrey Rolls consulted.   Status post treatment with IV heparin, status post left cardiac catheterization, with PCI and stenting to RCA. Patient started on dual antiplatelet therapy, Brilinta and aspirin, and full dose statins.  NSTEMI (non-ST elevated myocardial infarction) ([facility])  Non-STEMI/ Chest pain, -status post left cardiac catheterization, with PCI and stenting to RCA On 07/15/2019 -Patient started on dual antiplatelet therapy, Brilinta and aspirin.   -ACS was ruled in, abnormal EKG (negative ST elevation or depression),  elevated troponin 23 >> 14 >> 11,794, 12,794 -On heparin drip--DC'd -Cardiology Dr. Humphrey Rolls . -Was treated with supportive therapy with  enteric-coated aspirin as well as as needed sublingual nitroglycerin and morphine sulfate for pain. -We will continue her on IV heparin  drip. -2D echo >>> reviewed please see report in detail, ejection 956-21%, grade 1 diastolic dysfunction, or regional wall motion abnormality   Type 1 diabetes mellitus. A1c 6.9 -We will place on supplemental coverage with NovoLog and continue her basal coverage when  -She can continue on her home insulin pump.    Hypothyroidism. -We will continue her Armour  -TSH level; 0.73  Dyslipidemia. -Continue statins -LDL 79,  HDL 45, total cholesterol 135 triglyceride 55 .   Hypertension. -We will continue her Diovan.   Glaucoma. -We will continue her ophthalmic GTT.   Possible UTI  -abnormal urine findings leukocyte Estrace, WBCs -Doing empiric IV antibiotics of Rocephin --completed -Cultures negative to date   ---------------------------------------------------------------------------------------------------------------------------   Code Status:   Code Status: Full Code Family Communication:  Cussed with patient and her husband The above findings and plan of care has been discussed with patient (patient's husband)  in detail,  they expressed understanding and agreement of above. -Advance care planning has been discussed.... Remains full code  Admission status:  Status is: Inpatient    Dispo: The patient is from: Home  Anticipated d/c is to: Home  Consultants: Cardiology Dr. Humphrey Rolls     Discharge Instructions:   Discharge Instructions    AMB Referral to Cardiac Rehabilitation - Phase II   Complete by: As directed    Diagnosis: NSTEMI   After initial evaluation and assessments completed: Virtual Based Care may be provided alone or in  conjunction with Phase 2 Cardiac Rehab based on patient barriers.: Yes   AMB Referral to Cardiac Rehabilitation - Phase II   Complete by: As directed    Diagnosis:  Coronary Stents NSTEMI     After initial evaluation and assessments completed: Virtual Based Care may be provided alone or in  conjunction with Phase 2 Cardiac Rehab based on patient barriers.: Yes   Activity as tolerated - No restrictions   Complete by: As directed    Diet - low sodium heart healthy   Complete by: As directed    Diet Carb Modified   Complete by: As directed    Discharge instructions   Complete by: As directed    dual antiplatelet therapy of Brilinta and aspirin.  Will continue atorvastatin 11m.   [facility]   Increase activity slowly   Complete by: As directed        Medication List    STOP taking these medications   aspirin EC 81 MG tablet Replaced by: aspirin 81 MG chewable tablet   meloxicam 7.5 MG tablet Commonly known as: Mobic   Red Yeast Rice Extract 600 MG Caps     TAKE these medications   ascorbic acid 100 MG tablet Commonly known as: VITAMIN C Take 100 mg by mouth daily.   aspirin 81 MG chewable tablet Chew 1 tablet (81 mg total) by mouth daily. Replaces: aspirin EC 81 MG tablet   atorvastatin 80 MG tablet Commonly known as: LIPITOR Take 1 tablet (80 mg total) by mouth daily.   Biotin 10 MG Tabs Take 10 mg by mouth daily.   chlorhexidine 0.12 % solution Commonly known as: PERIDEX 30 mLs by Mouth Rinse route See admin instructions. Rinse as needed for mouth sores.   CO Q 10 PO Take 1 capsule by mouth daily.   docusate sodium 100 MG capsule Commonly known as: Colace Take 1 capsule (100 mg total) by mouth daily as needed.   HumaLOG 100 UNIT/ML injection Generic drug: insulin lispro Inject into the skin 3 (three) times daily before meals.   insulin detemir 100 UNIT/ML injection Commonly known as: LEVEMIR Inject 18 Units into the skin See admin instructions. For use when not using the insulin pump.   insulin pump Soln Inject into the skin.   latanoprost 0.005 % ophthalmic solution Commonly known as: XALATAN 1 drop at bedtime.   multivitamin capsule Take 1 capsule by mouth daily.     nitroGLYCERIN 0.4 MG SL tablet Commonly known as: NITROSTAT Place 1 tablet (0.4 mg total) under the tongue every 5 (five) minutes as needed for chest pain.   oxyCODONE-acetaminophen 5-325 MG tablet Commonly known as: Percocet Take 1 tablet by mouth every 8 (eight) hours as needed.   thyroid 65 MG tablet Commonly known as: ARMOUR Take 65 mg by mouth daily.   ticagrelor 90 MG Tabs tablet Commonly known as: BRILINTA Take 1 tablet (90 mg total) by mouth 2 (two) times daily.   Travoprost (BAK Free) 0.004 % Soln ophthalmic solution Commonly known as: TRAVATAN Place 1 drop into both eyes at bedtime.   traZODone 50 MG tablet Commonly known as: DESYREL Take 25 mg by mouth at bedtime as needed.   triamcinolone 55 MCG/ACT Aero nasal inhaler Commonly known as: NASACORT Place 1 spray into the nose daily as needed.   valsartan 40 MG tablet Commonly known as: DIOVAN Take 20 mg by mouth daily.   Zinc-220 220 (  50 Zn) MG capsule Generic drug: zinc sulfate Take 220 mg by mouth daily.      Follow-up Information    Dionisio David, MD. Go on 07/21/2019.   Specialty: Cardiology Why: 1115AM Contact information: [place] [address] [phone]          Allergies  Allergen Reactions  . No Known Allergies      Procedures /Studies:   DG Chest 2 View  Result Date: 07/14/2019 CLINICAL DATA:  Chest pain EXAM: CHEST - 2 VIEW COMPARISON:  09/16/2012 FINDINGS: The heart size and mediastinal contours are within normal limits. Both lungs are clear. The visualized skeletal structures are unremarkable. IMPRESSION: No active cardiopulmonary disease. Electronically Signed   By: Donavan Foil M.D.   On: 07/14/2019 20:53   CARDIAC CATHETERIZATION  Result Date: 07/15/2019  Prox Cx lesion is 99% stenosed.  A drug-eluting stent was successfully placed using a STENT RESOLUTE ONYX 2.5X22.  Post intervention, there is a 0% residual stenosis.  Unsuccessful attempted  intervention of proximal chronic total RCA unable to cross with a wire  Conclusion Successful PCI and stent to mid circumflex Reducing the lesion from 99 down to 0% 2.5 x 22 mm resolute Onyx DES deployed at 13 atm Unable to cross chronic total proximal right Minx deployed Angiomax ran at reduced rate for an additional 2 hours Cardiac care transferred back to Dr. Chancy Milroy   CARDIAC CATHETERIZATION  Result Date: 07/15/2019  Prox RCA lesion is 100% stenosed.  Ost Cx to Prox Cx lesion is 85% stenosed.  Mid LAD lesion is 40% stenosed.  High-grade lesion in the proximal left circumflex which supplies collaterals to occluded proximal RCA with mild disease in the mid LAD with myocardial bridge.  Normal left ventricular systolic function with mild inferior hypokinesis.  Advise PCI of the left circumflex since it supplying collaterals to the right coronary.   ECHOCARDIOGRAM COMPLETE  Result Date: 07/16/2019    ECHOCARDIOGRAM REPORT   Patient Name:   TASHAY BOZICH Junker Date of Exam: 07/15/2019 Medical Rec #:  [id number]          Height:       61.0 in Accession #:    [id number]         Weight:       151.0 lb Date of Birth:  13-Oct-1950           BSA:          1.676 m Patient Age:    68 years           BP:           102/55 mmHg Patient Gender: F                  HR:           53 bpm. Exam Location:  [facility] Procedure: 2D Echo, Cardiac Doppler and Color Doppler Indications:     R07.9 Chest pain  History:         Patient has no prior history of Echocardiogram examinations.                  Risk Factors:Hypertension and Diabetes. Hypothyroidism.  Sonographer:     Wilford Sports Rodgers-Jones Referring Phys:  [id number] [place] Diagnosing Phys: Neoma Laming MD IMPRESSIONS  1. Left ventricular ejection fraction, by estimation, is 60 to 65%. The left ventricle has normal function. The left ventricle demonstrates regional wall motion abnormalities (see scoring diagram/findings for description). Left ventricular diastolic  parameters are  consistent with Grade I diastolic dysfunction (impaired relaxation).  2. Right ventricular systolic function is normal. The right ventricular size is normal.  3. The mitral valve is normal in structure. No evidence of mitral valve regurgitation. No evidence of mitral stenosis.  4. The aortic valve is normal in structure. Aortic valve regurgitation is not visualized. No aortic stenosis is present.  5. The inferior vena cava is normal in size with greater than 50% respiratory variability, suggesting right atrial pressure of 3 mmHg. FINDINGS  Left Ventricle: Left ventricular ejection fraction, by estimation, is 60 to 65%. The left ventricle has normal function. The left ventricle demonstrates regional wall motion abnormalities. The left ventricular internal cavity size was normal in size. There is no left ventricular hypertrophy. Left ventricular diastolic parameters are consistent with Grade I diastolic dysfunction (impaired relaxation).  LV Wall Scoring: The mid and distal lateral wall is hypokinetic. Right Ventricle: The right ventricular size is normal. No increase in right ventricular wall thickness. Right ventricular systolic function is normal. Left Atrium: Left atrial size was normal in size. Right Atrium: Right atrial size was normal in size. Pericardium: There is no evidence of pericardial effusion. Mitral Valve: The mitral valve is normal in structure. Normal mobility of the mitral valve leaflets. No evidence of mitral valve regurgitation. No evidence of mitral valve stenosis. Tricuspid Valve: The tricuspid valve is normal in structure. Tricuspid valve regurgitation is not demonstrated. No evidence of tricuspid stenosis. Aortic Valve: The aortic valve is normal in structure. Aortic valve regurgitation is not visualized. No aortic stenosis is present. Pulmonic Valve: The pulmonic valve was normal in structure. Pulmonic valve regurgitation is not visualized. No evidence of pulmonic stenosis. Aorta: The aortic  root is normal in size and structure. Venous: The inferior vena cava is normal in size with greater than 50% respiratory variability, suggesting right atrial pressure of 3 mmHg. IAS/Shunts: No atrial level shunt detected by color flow Doppler.  LEFT VENTRICLE PLAX 2D LVIDd:         3.93 cm Diastology LVIDs:         2.78 cm LV e' lateral:   8.05 cm/s LV PW:         1.15 cm LV E/e' lateral: 11.8 LV IVS:        1.11 cm LV e' medial:    10.10 cm/s                        LV E/e' medial:  9.4  RIGHT VENTRICLE RV Basal diam:  3.54 cm RV S prime:     11.90 cm/s TAPSE (M-mode): 2.6 cm LEFT ATRIUM             Index       RIGHT ATRIUM           Index LA diam:        3.70 cm 2.21 cm/m  RA Area:     10.00 cm LA Vol (A2C):   48.9 ml 29.17 ml/m RA Volume:   23.10 ml  13.78 ml/m LA Vol (A4C):   41.6 ml 24.82 ml/m LA Biplane Vol: 47.7 ml 28.46 ml/m   AORTA Ao Root diam: 2.80 cm MITRAL VALVE MV Area (PHT): 3.85 cm MV Decel Time: 197 msec MV E velocity: 94.60 cm/s MV A velocity: 71.30 cm/s MV E/A ratio:  1.33 Neoma Laming MD Electronically signed by Neoma Laming MD Signature Date/Time: 07/16/2019/12:04:32 PM    Final  CT Renal Stone Study  Result Date: 06/23/2019 CLINICAL DATA:  Right flank pain for 2 days. Kidney stone suspected. EXAM: CT ABDOMEN AND PELVIS WITHOUT CONTRAST TECHNIQUE: Multidetector CT imaging of the abdomen and pelvis was performed following the standard protocol without IV contrast. COMPARISON:  02/07/2019 pelvic radiograph. No prior CT. FINDINGS: Lower chest: The bibasilar scarring. Normal heart size without pericardial or pleural effusion. Hepatobiliary: Normal liver. Normal gallbladder, without biliary ductal dilatation. Pancreas: Normal, without mass or ductal dilatation. Spleen: Old granulomatous disease in the spleen. Adrenals/Urinary Tract: Normal adrenal glands. No renal calculi. Moderate right renal/perirenal edema. No hydroureter or ureteric calculi. No bladder calculi. Stomach/Bowel: Normal  stomach, without wall thickening. Normal colon, appendix, and terminal ileum. Normal small bowel. Vascular/Lymphatic: Aortic and branch vessel atherosclerosis. No abdominopelvic adenopathy. Reproductive: Normal uterus and adnexa. Other: No significant free fluid. Mild pelvic floor laxity. Curvilinear calcifications on 76/2 could be dystrophic or represent stones within a calyceal diverticulum. Musculoskeletal: No acute osseous abnormality. IMPRESSION: 1. No urinary tract calculi or hydronephrosis. 2. Moderate right renal/perirenal edema. Correlate with urinalysis to exclude pyelonephritis. Recent stone passage could look similar. 3. Curvilinear calcifications within the right hemipelvis could be dystrophic or represent stones within a calyceal diverticulum. 4. Pelvic floor laxity. 5. Aortic Atherosclerosis (ICD10-I70.0). Electronically Signed   By: Abigail Miyamoto M.D.   On: 06/23/2019 14:58     Subjective:   Patient was seen and examined 07/17/2019, 10:00 AM Patient stable today. No acute distress.  No issues overnight Stable for discharge.  Discharge Exam:    Vitals:   07/16/19 1940 07/17/19 0542 07/17/19 0544 07/17/19 0732  BP: 107/72 (!) 104/34 (!) 124/48 (!) 116/48  Pulse: 68 63 67 63  Resp: 16 18  16   Temp: 98.4 F (36.9 C) 98.4 F (36.9 C)    TempSrc: Oral Oral    SpO2: 100% 97%  98%  Weight:   69.8 kg   Height:        General: Pt lying comfortably in bed & appears in no obvious distress. Cardiovascular: S1 & S2 heard, RRR, S1/S2 +. No murmurs, rubs, gallops or clicks. No JVD or pedal edema. Respiratory: Clear to auscultation without wheezing, rhonchi or crackles. No increased work of breathing. Abdominal:  Non-distended, non-tender & soft. No organomegaly or masses appreciated. Normal bowel sounds heard. CNS: Alert and oriented. No focal deficits. Extremities: no edema, no cyanosis    The results of significant diagnostics from this hospitalization (including imaging,  microbiology, ancillary and laboratory) are listed below for reference.      Microbiology:   Recent Results (from the past 240 hour(s))  Respiratory Panel by RT PCR (Flu A&B, Covid) - Nasopharyngeal Swab     Status: None   Collection Time: 07/15/19  1:53 AM   Specimen: Nasopharyngeal Swab  Result Value Ref Range Status   SARS Coronavirus 2 by RT PCR NEGATIVE NEGATIVE Final    Comment: (NOTE) SARS-CoV-2 target nucleic acids are NOT DETECTED. The SARS-CoV-2 RNA is generally detectable in upper respiratoy specimens during the acute phase of infection. The lowest concentration of SARS-CoV-2 viral copies this assay can detect is 131 copies/mL. A negative result does not preclude SARS-Cov-2 infection and should not be used as the sole basis for treatment or other patient management decisions. A negative result may occur with  improper specimen collection/handling, submission of specimen other than nasopharyngeal swab, presence of viral mutation(s) within the areas targeted by this assay, and inadequate number of viral copies (<131 copies/mL). A negative  result must be combined with clinical observations, patient history, and epidemiological information. The expected result is Negative. Fact Sheet for Patients:  PinkCheek.be Fact Sheet for Healthcare Providers:  GravelBags.it This test is not yet ap proved or cleared by the [place] FDA and  has been authorized for detection and/or diagnosis of SARS-CoV-2 by FDA under an Emergency Use Authorization (EUA). This EUA will remain  in effect (meaning this test can be used) for the duration of the COVID-19 declaration under Section 564(b)(1) of the Act, 21 U.S.C. section 360bbb-3(b)(1), unless the authorization is terminated or revoked sooner.    Influenza A by PCR NEGATIVE NEGATIVE Final   Influenza B by PCR NEGATIVE NEGATIVE Final    Comment: (NOTE) The Xpert Xpress  SARS-CoV-2/FLU/RSV assay is intended as an aid in  the diagnosis of influenza from Nasopharyngeal swab specimens and  should not be used as a sole basis for treatment. Nasal washings and  aspirates are unacceptable for Xpert Xpress SARS-CoV-2/FLU/RSV  testing. Fact Sheet for Patients: PinkCheek.be Fact Sheet for Healthcare Providers: GravelBags.it This test is not yet approved or cleared by the [place] FDA and  has been authorized for detection and/or diagnosis of SARS-CoV-2 by  FDA under an Emergency Use Authorization (EUA). This EUA will remain  in effect (meaning this test can be used) for the duration of the  Covid-19 declaration under Section 564(b)(1) of the Act, 21  U.S.C. section 360bbb-3(b)(1), unless the authorization is  terminated or revoked. Performed at [facility], [address] [place], [place] [address]      Labs:   CBC: Recent Labs  Lab 07/14/19 2042 07/16/19 0523 07/17/19 0522  WBC 6.0 6.0 5.8  HGB 11.3* 10.0* 9.6*  HCT 32.6* 29.6* 29.3*  MCV 96.2 97.7 100.0  PLT 244 162 366*   Basic Metabolic Panel: Recent Labs  Lab 07/14/19 2042 07/15/19 0632 07/16/19 0523 07/17/19 0522  NA 139 140 143 142  K 4.2 3.8 3.9 3.7  CL 108 112* 112* 109  CO2 24 24 25 27   GLUCOSE 221* 187* 142* 147*  BUN 18 18 9  7*  CREATININE 0.74 0.59 0.62 0.64  CALCIUM 8.8* 8.3* 8.3* 8.4*  MG  --  2.2  --   --    Liver Function Tests: No results for input(s): AST, ALT, ALKPHOS, BILITOT, PROT, ALBUMIN in the last 168 hours. BNP (last 3 results) No results for input(s): BNP in the last 8760 hours. Cardiac Enzymes: No results for input(s): CKTOTAL, CKMB, CKMBINDEX, TROPONINI in the last 168 hours. CBG: Recent Labs  Lab 07/16/19 1650 07/16/19 2006 07/16/19 2345 07/17/19 0542 07/17/19 0734  GLUCAP 112* 142* 138* 143* 147*   Hgb A1c Recent Labs    07/16/19 0523  HGBA1C 6.9*   Lipid  Profile Recent Labs    07/16/19 0523  CHOL 135  HDL 45  LDLCALC 79  TRIG 55  CHOLHDL 3.0   Thyroid function studies Recent Labs    07/14/19 2258  TSH 0.735   Anemia work up No results for input(s): VITAMINB12, FOLATE, FERRITIN, TIBC, IRON, RETICCTPCT in the last 72 hours. Urinalysis    Component Value Date/Time   COLORURINE YELLOW (A) 07/16/2019 1224   APPEARANCEUR CLEAR (A) 07/16/2019 1224   APPEARANCEUR CLEAR 01/28/2014 1230   LABSPEC 1.015 07/16/2019 1224   LABSPEC 1.015 01/28/2014 1230   PHURINE 5.0 07/16/2019 1224   GLUCOSEU >=500 (A) 07/16/2019 1224   GLUCOSEU NEGATIVE 01/28/2014 1230   HGBUR NEGATIVE 07/16/2019 1224  BILIRUBINUR NEGATIVE 07/16/2019 [place] 01/28/2014 [place] 07/16/2019 1224   PROTEINUR NEGATIVE 07/16/2019 1224   NITRITE NEGATIVE 07/16/2019 1224   LEUKOCYTESUR TRACE (A) 07/16/2019 1224   LEUKOCYTESUR TRACE 01/28/2014 1230         Time coordinating discharge: Over 45 minutes  SIGNED: Deatra James, MD, FACP, FHM. [facility],  Pager [phone]  If 7PM-7AM, please contact night-coverage [email or web address], Password [facility] 07/17/2019, 10:00 AM

## 2019-07-17 NOTE — Progress Notes (Signed)
SUBJECTIVE: Patient doing well.  Denies chest pain or tightness of breath.   Vitals:   07/16/19 1940 07/17/19 0542 07/17/19 0544 07/17/19 0732  BP: 107/72 (!) 104/34 (!) 124/48 (!) 116/48  Pulse: 68 63 67 63  Resp: 16 18  16   Temp: 98.4 F (36.9 C) 98.4 F (36.9 C)    TempSrc: Oral Oral    SpO2: 100% 97%  98%  Weight:   69.8 kg   Height:        Intake/Output Summary (Last 24 hours) at 07/17/2019 0935 Last data filed at 07/17/2019 0500 Gross per 24 hour  Intake 840 ml  Output 1301 ml  Net -461 ml    LABS: Basic Metabolic Panel: Recent Labs    07/15/19 0632 07/15/19 0632 07/16/19 0523 07/17/19 0522  NA 140   < > 143 142  K 3.8   < > 3.9 3.7  CL 112*   < > 112* 109  CO2 24   < > 25 27  GLUCOSE 187*   < > 142* 147*  BUN 18   < > 9 7*  CREATININE 0.59   < > 0.62 0.64  CALCIUM 8.3*   < > 8.3* 8.4*  MG 2.2  --   --   --    < > = values in this interval not displayed.   Liver Function Tests: No results for input(s): AST, ALT, ALKPHOS, BILITOT, PROT, ALBUMIN in the last 72 hours. No results for input(s): LIPASE, AMYLASE in the last 72 hours. CBC: Recent Labs    07/16/19 0523 07/17/19 0522  WBC 6.0 5.8  HGB 10.0* 9.6*  HCT 29.6* 29.3*  MCV 97.7 100.0  PLT 162 145*   Cardiac Enzymes: No results for input(s): CKTOTAL, CKMB, CKMBINDEX, TROPONINI in the last 72 hours. BNP: Invalid input(s): POCBNP D-Dimer: No results for input(s): DDIMER in the last 72 hours. Hemoglobin A1C: Recent Labs    07/16/19 0523  HGBA1C 6.9*   Fasting Lipid Panel: Recent Labs    07/16/19 0523  CHOL 135  HDL 45  LDLCALC 79  TRIG 55  CHOLHDL 3.0   Thyroid Function Tests: Recent Labs    07/14/19 2258  TSH 0.735   Anemia Panel: No results for input(s): VITAMINB12, FOLATE, FERRITIN, TIBC, IRON, RETICCTPCT in the last 72 hours.   PHYSICAL EXAM General: Well developed, well nourished, in no acute distress HEENT:  Normocephalic and atramatic Neck:  No JVD.  Lungs: Clear  bilaterally to auscultation and percussion. Heart: HRRR . Normal S1 and S2 without gallops or murmurs.  Abdomen: Bowel sounds are positive, abdomen soft and non-tender  Msk:  Back normal, normal gait. Normal strength and tone for age. Extremities: No clubbing, cyanosis or edema.   Neuro: Alert and oriented X 3. Psych:  Good affect, responds appropriately  TELEMETRY: NSR 88/bpm  ASSESSMENT AND PLAN: Patient initially presenting to the emergency department with chest pain.  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  A DES was placed in her mid circumflex and the patient was started on dual antiplatelet therapy of Brilinta and aspirin.  Will continue atorvastatin 80mg .  From a cardiac point of view with successful placement of the DES in her mid circumflex and resolution of chest pain and shortness of breath patient is stable for discharge.  We will see the patient in our office at [facility] on April 27 at 1115AM.  Principal Problem:  NSTEMI (non-ST elevated myocardial infarction) [facility]) Active Problems:   Chest pain   Acute lower UTI   Hypertension complicating diabetes ([place])   Type 1 diabetes mellitus without complication ([place])    Adaline Sill, NP-C 07/17/2019 9:35 AM

## 2019-07-22 DIAGNOSIS — I2510 Atherosclerotic heart disease of native coronary artery without angina pectoris: Secondary | ICD-10-CM | POA: Insufficient documentation

## 2019-07-29 ENCOUNTER — Encounter: Payer: 59 | Attending: Cardiovascular Disease

## 2019-07-29 ENCOUNTER — Other Ambulatory Visit: Payer: Self-pay

## 2019-07-29 DIAGNOSIS — I252 Old myocardial infarction: Secondary | ICD-10-CM | POA: Insufficient documentation

## 2019-07-29 DIAGNOSIS — I214 Non-ST elevation (NSTEMI) myocardial infarction: Secondary | ICD-10-CM

## 2019-07-29 NOTE — Progress Notes (Addendum)
Virtual Visit Completed. Patient informed of EP and RD schedule. Visit diagnosis can be found in [facility] 07/14/2019

## 2019-07-30 ENCOUNTER — Encounter: Payer: 59 | Admitting: *Deleted

## 2019-07-30 VITALS — Ht 60.75 in | Wt 146.9 lb

## 2019-07-30 DIAGNOSIS — I252 Old myocardial infarction: Secondary | ICD-10-CM | POA: Diagnosis present

## 2019-07-30 DIAGNOSIS — I214 Non-ST elevation (NSTEMI) myocardial infarction: Secondary | ICD-10-CM

## 2019-07-30 NOTE — Progress Notes (Signed)
Cardiac Individual Treatment Plan  Patient Details  Name: Cheyenne Lopez MRN: [id number] Date of Birth: 07-Nov-1950 Referring Provider:     Cardiac Rehab from 07/30/2019 in [facility] Cardiac and Pulmonary Rehab  Referring Provider  Neoma Laming MD      Initial Encounter Date:    Cardiac Rehab from 07/30/2019 in [facility] Cardiac and Pulmonary Rehab  Date  07/30/19      Visit Diagnosis: NSTEMI (non-ST elevated myocardial infarction) [facility])  Patient's Home Medications on Admission:  Current Outpatient Medications:  .  ascorbic acid (VITAMIN C) 100 MG tablet, Take 100 mg by mouth daily., Disp: , Rfl:  .  aspirin 81 MG chewable tablet, Chew 1 tablet (81 mg total) by mouth daily., Disp: 30 tablet, Rfl: 0 .  atorvastatin (LIPITOR) 80 MG tablet, Take 1 tablet (80 mg total) by mouth daily., Disp: 90 tablet, Rfl: 0 .  Biotin 10 MG TABS, Take 10 mg by mouth daily., Disp: , Rfl:  .  chlorhexidine (PERIDEX) 0.12 % solution, 30 mLs by Mouth Rinse route See admin instructions. Rinse as needed for mouth sores., Disp: , Rfl:  .  Coenzyme Q10 (CO Q 10 PO), Take 1 capsule by mouth daily., Disp: , Rfl:  .  docusate sodium (COLACE) 100 MG capsule, Take 1 capsule (100 mg total) by mouth daily as needed., Disp: 30 capsule, Rfl: 2 .  insulin detemir (LEVEMIR) 100 UNIT/ML injection, Inject 18 Units into the skin See admin instructions. For use when not using the insulin pump., Disp: , Rfl:  .  Insulin Human (INSULIN PUMP) SOLN, Inject into the skin., Disp: , Rfl:  .  insulin lispro (HUMALOG) 100 UNIT/ML injection, Inject into the skin 3 (three) times daily before meals., Disp: , Rfl:  .  latanoprost (XALATAN) 0.005 % ophthalmic solution, 1 drop at bedtime., Disp: , Rfl:  .  Multiple Vitamin (MULTIVITAMIN) capsule, Take 1 capsule by mouth daily., Disp: , Rfl:  .  nitroGLYCERIN (NITROSTAT) 0.4 MG SL tablet, Place 1 tablet (0.4 mg total) under the tongue every 5 (five) minutes as needed for chest pain., Disp: 9 tablet,  Rfl: 12 .  oxyCODONE-acetaminophen (PERCOCET) 5-325 MG tablet, Take 1 tablet by mouth every 8 (eight) hours as needed. (Patient not taking: Reported on 07/29/2019), Disp: 20 tablet, Rfl: 0 .  thyroid (ARMOUR) 65 MG tablet, Take 65 mg by mouth daily., Disp: , Rfl:  .  ticagrelor (BRILINTA) 90 MG TABS tablet, Take 1 tablet (90 mg total) by mouth 2 (two) times daily., Disp: 60 tablet, Rfl: 3 .  Travoprost, BAK Free, (TRAVATAN) 0.004 % SOLN ophthalmic solution, Place 1 drop into both eyes at bedtime., Disp: , Rfl:  .  traZODone (DESYREL) 50 MG tablet, Take 25 mg by mouth at bedtime as needed., Disp: , Rfl:  .  triamcinolone (NASACORT) 55 MCG/ACT AERO nasal inhaler, Place 1 spray into the nose daily as needed., Disp: , Rfl:  .  valsartan (DIOVAN) 40 MG tablet, Take 20 mg by mouth daily. , Disp: , Rfl: 11 .  zinc sulfate (ZINC-220) 220 (50 Zn) MG capsule, Take 220 mg by mouth daily., Disp: , Rfl:   Past Medical History: Past Medical History:  Diagnosis Date  . Hypertension   . Hypothyroid   . Type 1 diabetes (HCC)     Tobacco Use: Social History   Tobacco Use  Smoking Status Never Smoker  Smokeless Tobacco Never Used    Labs: Recent Review Scientist, physiological    Labs for ITP Cardiac  and Pulmonary Rehab Latest Ref Rng & Units 07/16/2019   Cholestrol 0 - 200 mg/dL 135   LDLCALC 0 - 99 mg/dL 79   HDL >40 mg/dL 45   Trlycerides <150 mg/dL 55   Hemoglobin A1c 4.8 - 5.6 % 6.9(H)       Exercise Target Goals: Exercise Program Goal: Individual exercise prescription set using results from initial 6 min walk test and THRR while considering  patient's activity barriers and safety.   Exercise Prescription Goal: Initial exercise prescription builds to 30-45 minutes a day of aerobic activity, 2-3 days per week.  Home exercise guidelines will be given to patient during program as part of exercise prescription that the participant will acknowledge.   Education: Aerobic Exercise & Resistance  Training: - Gives group verbal and written instruction on the various components of exercise. Focuses on aerobic and resistive training programs and the benefits of this training and how to safely progress through these programs..   Education: Exercise & Equipment Safety: - Individual verbal instruction and demonstration of equipment use and safety with use of the equipment.   Cardiac Rehab from 07/29/2019 in [facility] Cardiac and Pulmonary Rehab  Date  07/29/19  Educator  [facility]  Instruction Review Code  1- Verbalizes Understanding      Education: Exercise Physiology & General Exercise Guidelines: - Group verbal and written instruction with models to review the exercise physiology of the cardiovascular system and associated critical values. Provides general exercise guidelines with specific guidelines to those with heart or lung disease.    Education: Flexibility, Balance, Mind/Body Relaxation: Provides group verbal/written instruction on the benefits of flexibility and balance training, including mind/body exercise modes such as yoga, pilates and tai chi.  Demonstration and skill practice provided.   Activity Barriers & Risk Stratification: Activity Barriers & Cardiac Risk Stratification - 07/30/19 1308      Activity Barriers & Cardiac Risk Stratification   Activity Barriers  Shortness of Breath;Deconditioning;Muscular Weakness;Other (comment);Balance Concerns    Comments  previous vertigo, meds sometimes made it difficult to walk    Cardiac Risk Stratification  Moderate       6 Minute Walk: 6 Minute Walk    Row Name 07/30/19 1305         6 Minute Walk   Phase  Initial     Distance  1265 feet     Walk Time  6 minutes     # of Rest Breaks  0     MPH  2.4     METS  2.75     RPE  11     VO2 Peak  9.64     Symptoms  Yes (comment)     Comments  fatigue, r hip pain/dull ache 2-3/10     Resting HR  63 bpm     Resting BP  138/72     Resting Oxygen Saturation   99 %     Exercise  Oxygen Saturation  during 6 min walk  96 %     Max Ex. HR  87 bpm     Max Ex. BP  144/74     2 Minute Post BP  128/70        Oxygen Initial Assessment:   Oxygen Re-Evaluation:   Oxygen Discharge (Final Oxygen Re-Evaluation):   Initial Exercise Prescription: Initial Exercise Prescription - 07/30/19 1300      Date of Initial Exercise RX and Referring Provider   Date  07/30/19    Referring Provider  Neoma Laming MD      Treadmill   MPH  2.3    Grade  0    Minutes  15    METs  2.76      Elliptical   Level  1    Speed  2.3    Minutes  15    METs  2      REL-XR   Level  1    Speed  50    Minutes  15    METs  2      Prescription Details   Frequency (times per week)  3    Duration  Progress to 30 minutes of continuous aerobic without signs/symptoms of physical distress      Intensity   THRR 40-80% of Max Heartrate  99-134    Ratings of Perceived Exertion  11-13    Perceived Dyspnea  0-4      Progression   Progression  Continue to progress workloads to maintain intensity without signs/symptoms of physical distress.      Resistance Training   Training Prescription  Yes    Weight  3 lb    Reps  10-15       Perform Capillary Blood Glucose checks as needed.  Exercise Prescription Changes: Exercise Prescription Changes    Row Name 07/30/19 1300             Response to Exercise   Blood Pressure (Admit)  138/72       Blood Pressure (Exercise)  144/74       Blood Pressure (Exit)  128/70       Heart Rate (Admit)  63 bpm       Heart Rate (Exercise)  87 bpm       Heart Rate (Exit)  63 bpm       Oxygen Saturation (Admit)  99 %       Oxygen Saturation (Exercise)  96 %       Rating of Perceived Exertion (Exercise)  11       Symptoms  fatigue, r hip pain dull ache 2-3/10       Comments  walk test results          Exercise Comments:   Exercise Goals and Review: Exercise Goals    Row Name 07/30/19 1312             Exercise Goals   Increase  Physical Activity  Yes       Intervention  Provide advice, education, support and counseling about physical activity/exercise needs.;Develop an individualized exercise prescription for aerobic and resistive training based on initial evaluation findings, risk stratification, comorbidities and participant's personal goals.       Expected Outcomes  Short Term: Attend rehab on a regular basis to increase amount of physical activity.;Long Term: Add in home exercise to make exercise part of routine and to increase amount of physical activity.;Long Term: Exercising regularly at least 3-5 days a week.       Increase Strength and Stamina  Yes       Intervention  Provide advice, education, support and counseling about physical activity/exercise needs.;Develop an individualized exercise prescription for aerobic and resistive training based on initial evaluation findings, risk stratification, comorbidities and participant's personal goals.       Expected Outcomes  Short Term: Increase workloads from initial exercise prescription for resistance, speed, and METs.;Short Term: Perform resistance training exercises routinely during rehab and add in resistance training at home;Long Term: Improve cardiorespiratory fitness,  muscular endurance and strength as measured by increased METs and functional capacity (6MWT)       Able to understand and use rate of perceived exertion (RPE) scale  Yes       Intervention  Provide education and explanation on how to use RPE scale       Expected Outcomes  Short Term: Able to use RPE daily in rehab to express subjective intensity level;Long Term:  Able to use RPE to guide intensity level when exercising independently       Able to understand and use Dyspnea scale  Yes       Intervention  Provide education and explanation on how to use Dyspnea scale       Expected Outcomes  Short Term: Able to use Dyspnea scale daily in rehab to express subjective sense of shortness of breath during  exertion;Long Term: Able to use Dyspnea scale to guide intensity level when exercising independently       Knowledge and understanding of Target Heart Rate Range (THRR)  Yes       Intervention  Provide education and explanation of THRR including how the numbers were predicted and where they are located for reference       Expected Outcomes  Short Term: Able to state/look up THRR;Long Term: Able to use THRR to govern intensity when exercising independently;Short Term: Able to use daily as guideline for intensity in rehab       Able to check pulse independently  Yes       Intervention  Provide education and demonstration on how to check pulse in carotid and radial arteries.;Review the importance of being able to check your own pulse for safety during independent exercise       Expected Outcomes  Short Term: Able to explain why pulse checking is important during independent exercise;Long Term: Able to check pulse independently and accurately       Understanding of Exercise Prescription  Yes       Intervention  Provide education, explanation, and written materials on patient's individual exercise prescription       Expected Outcomes  Short Term: Able to explain program exercise prescription;Long Term: Able to explain home exercise prescription to exercise independently          Exercise Goals Re-Evaluation :   Discharge Exercise Prescription (Final Exercise Prescription Changes): Exercise Prescription Changes - 07/30/19 1300      Response to Exercise   Blood Pressure (Admit)  138/72    Blood Pressure (Exercise)  144/74    Blood Pressure (Exit)  128/70    Heart Rate (Admit)  63 bpm    Heart Rate (Exercise)  87 bpm    Heart Rate (Exit)  63 bpm    Oxygen Saturation (Admit)  99 %    Oxygen Saturation (Exercise)  96 %    Rating of Perceived Exertion (Exercise)  11    Symptoms  fatigue, r hip pain dull ache 2-3/10    Comments  walk test results       Nutrition:  Target Goals: Understanding  of nutrition guidelines, daily intake of sodium '1500mg'$ , cholesterol '200mg'$ , calories 30% from fat and 7% or less from saturated fats, daily to have 5 or more servings of fruits and vegetables.  Education: Controlling Sodium/Reading Food Labels -Group verbal and written material supporting the discussion of sodium use in heart healthy nutrition. Review and explanation with models, verbal and written materials for utilization of the food label.   Education: General  Nutrition Guidelines/Fats and Fiber: -Group instruction provided by verbal, written material, models and posters to present the general guidelines for heart healthy nutrition. Gives an explanation and review of dietary fats and fiber.   Biometrics: Pre Biometrics - 07/30/19 1313      Pre Biometrics   Height  5' 0.75" (1.543 m)    Weight  146 lb 14.4 oz (66.6 kg)    BMI (Calculated)  27.99    Single Leg Stand  13.6 seconds        Nutrition Therapy Plan and Nutrition Goals:   Nutrition Assessments: Nutrition Assessments - 07/30/19 1314      MEDFICTS Scores   Pre Score  13       MEDIFICTS Score Key:          ?70 Need to make dietary changes          40-70 Heart Healthy Diet         ? 40 Therapeutic Level Cholesterol Diet  Nutrition Goals Re-Evaluation:   Nutrition Goals Discharge (Final Nutrition Goals Re-Evaluation):   Psychosocial: Target Goals: Acknowledge presence or absence of significant depression and/or stress, maximize coping skills, provide positive support system. Participant is able to verbalize types and ability to use techniques and skills needed for reducing stress and depression.   Education: Depression - Provides group verbal and written instruction on the correlation between heart/lung disease and depressed mood, treatment options, and the stigmas associated with seeking treatment.   Education: Sleep Hygiene -Provides group verbal and written instruction about how sleep can affect your  health.  Define sleep hygiene, discuss sleep cycles and impact of sleep habits. Review good sleep hygiene tips.     Education: Stress and Anxiety: - Provides group verbal and written instruction about the health risks of elevated stress and causes of high stress.  Discuss the correlation between heart/lung disease and anxiety and treatment options. Review healthy ways to manage with stress and anxiety.    Initial Review & Psychosocial Screening: Initial Psych Review & Screening - 07/29/19 1336      Initial Review   Current issues with  None Identified      Family Dynamics   Good Support System?  Yes    Comments  She can look  to her husband, son and borther inlaw for support. She has a positive outlook on her health.      Barriers   Psychosocial barriers to participate in program  There are no identifiable barriers or psychosocial needs.      Screening Interventions   Interventions  Encouraged to exercise;Provide feedback about the scores to participant;To provide support and resources with identified psychosocial needs    Expected Outcomes  Short Term goal: Utilizing psychosocial counselor, staff and physician to assist with identification of specific Stressors or current issues interfering with healing process. Setting desired goal for each stressor or current issue identified.;Short Term goal: Identification and review with participant of any Quality of Life or Depression concerns found by scoring the questionnaire.;Long Term Goal: Stressors or current issues are controlled or eliminated.;Long Term goal: The participant improves quality of Life and PHQ9 Scores as seen by post scores and/or verbalization of changes       Quality of Life Scores:  Quality of Life - 07/30/19 1313      Quality of Life   Select  Quality of Life      Quality of Life Scores   Health/Function Pre  24.46 %    Socioeconomic Pre  27 %    Psych/Spiritual Pre  26.57 %    Family Pre  26.4 %    GLOBAL Pre   25.74 %      Scores of 19 and below usually indicate a poorer quality of life in these areas.  A difference of  2-3 points is a clinically meaningful difference.  A difference of 2-3 points in the total score of the Quality of Life Index has been associated with significant improvement in overall quality of life, self-image, physical symptoms, and general health in studies assessing change in quality of life.  PHQ-9: Recent Review Flowsheet Data    Depression screen [facility] 2/9 07/30/2019   Decreased Interest 0   Down, Depressed, Hopeless 0   PHQ - 2 Score 0   Altered sleeping 1   Tired, decreased energy 3   Change in appetite 0   Feeling bad or failure about yourself  0   Trouble concentrating 1   Moving slowly or fidgety/restless 0   Suicidal thoughts 0   PHQ-9 Score 5   Difficult doing work/chores Somewhat difficult     Interpretation of Total Score  Total Score Depression Severity:  1-4 = Minimal depression, 5-9 = Mild depression, 10-14 = Moderate depression, 15-19 = Moderately severe depression, 20-27 = Severe depression   Psychosocial Evaluation and Intervention: Psychosocial Evaluation - 07/29/19 1401      Psychosocial Evaluation & Interventions   Interventions  Encouraged to exercise with the program and follow exercise prescription    Comments  Patient has a positive outlook on her health and states no depression issues.    Expected Outcomes  Short: Attend [facility] stress management education to decrease stress. Long: Maintain exercise Post HeartTrack to keep stress at a minimum.    Continue Psychosocial Services   Follow up required by staff       Psychosocial Re-Evaluation:   Psychosocial Discharge (Final Psychosocial Re-Evaluation):   Vocational Rehabilitation: Provide vocational rehab assistance to qualifying candidates.   Vocational Rehab Evaluation & Intervention:   Education: Education Goals: Education classes will be provided on a variety of topics  geared toward better understanding of heart health and risk factor modification. Participant will state understanding/return demonstration of topics presented as noted by education test scores.  Learning Barriers/Preferences: Learning Barriers/Preferences - 07/29/19 1338      Learning Barriers/Preferences   Learning Barriers  None    Learning Preferences  None       General Cardiac Education Topics:  AED/CPR: - Group verbal and written instruction with the use of models to demonstrate the basic use of the AED with the basic ABC's of resuscitation.   Anatomy & Physiology of the Heart: - Group verbal and written instruction and models provide basic cardiac anatomy and physiology, with the coronary electrical and arterial systems. Review of Valvular disease and Heart Failure   Cardiac Procedures: - Group verbal and written instruction to review commonly prescribed medications for heart disease. Reviews the medication, class of the drug, and side effects. Includes the steps to properly store meds and maintain the prescription regimen. (beta blockers and nitrates)   Cardiac Medications I: - Group verbal and written instruction to review commonly prescribed medications for heart disease. Reviews the medication, class of the drug, and side effects. Includes the steps to properly store meds and maintain the prescription regimen.   Cardiac Medications II: -Group verbal and written instruction to review commonly prescribed medications for heart disease. Reviews the medication, class of the drug, and  side effects. (all other drug classes)    Go Sex-Intimacy & Heart Disease, Get SMART - Goal Setting: - Group verbal and written instruction through game format to discuss heart disease and the return to sexual intimacy. Provides group verbal and written material to discuss and apply goal setting through the application of the S.M.A.R.T. Method.   Other Matters of the Heart: - Provides group  verbal, written materials and models to describe Stable Angina and Peripheral Artery. Includes description of the disease process and treatment options available to the cardiac patient.   Infection Prevention: - Provides verbal and written material to individual with discussion of infection control including proper hand washing and proper equipment cleaning during exercise session.   Cardiac Rehab from 07/29/2019 in [facility] Cardiac and Pulmonary Rehab  Date  07/29/19  Educator  [facility]  Instruction Review Code  1- Verbalizes Understanding      Falls Prevention: - Provides verbal and written material to individual with discussion of falls prevention and safety.   Cardiac Rehab from 07/29/2019 in [facility] Cardiac and Pulmonary Rehab  Date  07/29/19  Educator  [facility]  Instruction Review Code  1- Verbalizes Understanding      Other: -Provides group and verbal instruction on various topics (see comments)   Knowledge Questionnaire Score: Knowledge Questionnaire Score - 07/30/19 1314      Knowledge Questionnaire Score   Pre Score  23/26 Education Focus: exercise       Core Components/Risk Factors/Patient Goals at Admission: Personal Goals and Risk Factors at Admission - 07/30/19 1314      Core Components/Risk Factors/Patient Goals on Admission    Weight Management  Yes;Weight Loss    Intervention  Weight Management: Develop a combined nutrition and exercise program designed to reach desired caloric intake, while maintaining appropriate intake of nutrient and fiber, sodium and fats, and appropriate energy expenditure required for the weight goal.;Weight Management: Provide education and appropriate resources to help participant work on and attain dietary goals.    Admit Weight  146 lb 14.4 oz (66.6 kg)    Goal Weight: Short Term  141 lb (64 kg)    Goal Weight: Long Term  135 lb (61.2 kg)    Expected Outcomes  Short Term: Continue to assess and modify interventions until short term weight is  achieved;Weight Loss: Understanding of general recommendations for a balanced deficit meal plan, which promotes 1-2 lb weight loss per week and includes a negative energy balance of [phone] kcal/d;Understanding recommendations for meals to include 15-35% energy as protein, 25-35% energy from fat, 35-60% energy from carbohydrates, less than '200mg'$  of dietary cholesterol, 20-35 gm of total fiber daily;Understanding of distribution of calorie intake throughout the day with the consumption of 4-5 meals/snacks    Diabetes  Yes    Intervention  Provide education about signs/symptoms and action to take for hypo/hyperglycemia.;Provide education about proper nutrition, including hydration, and aerobic/resistive exercise prescription along with prescribed medications to achieve blood glucose in normal ranges: Fasting glucose 65-99 mg/dL    Expected Outcomes  Short Term: Participant verbalizes understanding of the signs/symptoms and immediate care of hyper/hypoglycemia, proper foot care and importance of medication, aerobic/resistive exercise and nutrition plan for blood glucose control.;Long Term: Attainment of HbA1C < 7%.    Hypertension  Yes    Intervention  Provide education on lifestyle modifcations including regular physical activity/exercise, weight management, moderate sodium restriction and increased consumption of fresh fruit, vegetables, and low fat dairy, alcohol moderation, and smoking cessation.;Monitor prescription use compliance.  Expected Outcomes  Short Term: Continued assessment and intervention until BP is < 140/38m HG in hypertensive participants. < 130/872mHG in hypertensive participants with diabetes, heart failure or chronic kidney disease.;Long Term: Maintenance of blood pressure at goal levels.    Lipids  Yes    Intervention  Provide education and support for participant on nutrition & aerobic/resistive exercise along with prescribed medications to achieve LDL <7049mHDL >17m49m   Expected Outcomes  Short Term: Participant states understanding of desired cholesterol values and is compliant with medications prescribed. Participant is following exercise prescription and nutrition guidelines.;Long Term: Cholesterol controlled with medications as prescribed, with individualized exercise RX and with personalized nutrition plan. Value goals: LDL < 70mg32mL > 40 mg.       Education:Diabetes - Individual verbal and written instruction to review signs/symptoms of diabetes, desired ranges of glucose level fasting, after meals and with exercise. Acknowledge that pre and post exercise glucose checks will be done for 3 sessions at entry of program.   [facility]  Date  07/29/19  [facility]  [facility]  1- Verbalizes Understanding      Education: Know Your Numbers and Risk Factors: -Group verbal and written instruction about important numbers in your health.  Discussion of what are risk factors and how they play a role in the disease process.  Review of Cholesterol, Blood Pressure, Diabetes, and BMI and the role they play in your overall health.   Core Components/Risk Factors/Patient Goals Review:    Core Components/Risk Factors/Patient Goals at Discharge (Final Review):    ITP Comments: ITP Comments    Row Name 07/29/19 1344 07/30/19 1304         ITP Comments  Virtual Visit Completed. Patient informed of EP and RD schedule. Visit diagnosis can be found in [facility]/2021  Completed 6MWT and gym orientation.  Initial ITP created and sent for review to Dr. Mark Emily Filbertical Director.         Comments: Initial ITP

## 2019-07-30 NOTE — Patient Instructions (Signed)
Patient Instructions  Patient Details  Name: Cheyenne Lopez MRN: [id number] Date of Birth: 05-Feb-1951 Referring Provider:  Dionisio David, MD  Below are your personal goals for exercise, nutrition, and risk factors. Our goal is to help you stay on track towards obtaining and maintaining these goals. We will be discussing your progress on these goals with you throughout the program.  Initial Exercise Prescription: Initial Exercise Prescription - 07/30/19 1300      Date of Initial Exercise RX and Referring Provider   Date  07/30/19    Referring Provider  Neoma Laming MD      Treadmill   MPH  2.3    Grade  0    Minutes  15    METs  2.76      Elliptical   Level  1    Speed  2.3    Minutes  15    METs  2      REL-XR   Level  1    Speed  50    Minutes  15    METs  2      Prescription Details   Frequency (times per week)  3    Duration  Progress to 30 minutes of continuous aerobic without signs/symptoms of physical distress      Intensity   THRR 40-80% of Max Heartrate  99-134    Ratings of Perceived Exertion  11-13    Perceived Dyspnea  0-4      Progression   Progression  Continue to progress workloads to maintain intensity without signs/symptoms of physical distress.      Resistance Training   Training Prescription  Yes    Weight  3 lb    Reps  10-15       Exercise Goals: Frequency: Be able to perform aerobic exercise two to three times per week in program working toward 2-5 days per week of home exercise.  Intensity: Work with a perceived exertion of 11 (fairly light) - 15 (hard) while following your exercise prescription.  We will make changes to your prescription with you as you progress through the program.   Duration: Be able to do 30 to 45 minutes of continuous aerobic exercise in addition to a 5 minute warm-up and a 5 minute cool-down routine.   Nutrition Goals: Your personal nutrition goals will be established when you do your nutrition analysis  with the dietician.  The following are general nutrition guidelines to follow: Cholesterol < 200mg /day Sodium < 1500mg /day Fiber: Women over 50 yrs - 21 grams per day  Personal Goals: Personal Goals and Risk Factors at Admission - 07/30/19 1314      Core Components/Risk Factors/Patient Goals on Admission    Weight Management  Yes;Weight Loss    Intervention  Weight Management: Develop a combined nutrition and exercise program designed to reach desired caloric intake, while maintaining appropriate intake of nutrient and fiber, sodium and fats, and appropriate energy expenditure required for the weight goal.;Weight Management: Provide education and appropriate resources to help participant work on and attain dietary goals.    Admit Weight  146 lb 14.4 oz (66.6 kg)    Goal Weight: Short Term  141 lb (64 kg)    Goal Weight: Long Term  135 lb (61.2 kg)    Expected Outcomes  Short Term: Continue to assess and modify interventions until short term weight is achieved;Weight Loss: Understanding of general recommendations for a balanced deficit meal plan, which promotes 1-2 lb weight loss  per week and includes a negative energy balance of [phone] kcal/d;Understanding recommendations for meals to include 15-35% energy as protein, 25-35% energy from fat, 35-60% energy from carbohydrates, less than 200mg  of dietary cholesterol, 20-35 gm of total fiber daily;Understanding of distribution of calorie intake throughout the day with the consumption of 4-5 meals/snacks    Diabetes  Yes    Intervention  Provide education about signs/symptoms and action to take for hypo/hyperglycemia.;Provide education about proper nutrition, including hydration, and aerobic/resistive exercise prescription along with prescribed medications to achieve blood glucose in normal ranges: Fasting glucose 65-99 mg/dL    Expected Outcomes  Short Term: Participant verbalizes understanding of the signs/symptoms and immediate care of  hyper/hypoglycemia, proper foot care and importance of medication, aerobic/resistive exercise and nutrition plan for blood glucose control.;Long Term: Attainment of HbA1C < 7%.    Hypertension  Yes    Intervention  Provide education on lifestyle modifcations including regular physical activity/exercise, weight management, moderate sodium restriction and increased consumption of fresh fruit, vegetables, and low fat dairy, alcohol moderation, and smoking cessation.;Monitor prescription use compliance.    Expected Outcomes  Short Term: Continued assessment and intervention until BP is < 140/64mm HG in hypertensive participants. < 130/31mm HG in hypertensive participants with diabetes, heart failure or chronic kidney disease.;Long Term: Maintenance of blood pressure at goal levels.    Lipids  Yes    Intervention  Provide education and support for participant on nutrition & aerobic/resistive exercise along with prescribed medications to achieve LDL 70mg , HDL >40mg .    Expected Outcomes  Short Term: Participant states understanding of desired cholesterol values and is compliant with medications prescribed. Participant is following exercise prescription and nutrition guidelines.;Long Term: Cholesterol controlled with medications as prescribed, with individualized exercise RX and with personalized nutrition plan. Value goals: LDL < 70mg , HDL > 40 mg.       Tobacco Use Initial Evaluation: Social History   Tobacco Use  Smoking Status Never Smoker  Smokeless Tobacco Never Used    Exercise Goals and Review: Exercise Goals    Row Name 07/30/19 1312             Exercise Goals   Increase Physical Activity  Yes       Intervention  Provide advice, education, support and counseling about physical activity/exercise needs.;Develop an individualized exercise prescription for aerobic and resistive training based on initial evaluation findings, risk stratification, comorbidities and participant's personal  goals.       Expected Outcomes  Short Term: Attend rehab on a regular basis to increase amount of physical activity.;Long Term: Add in home exercise to make exercise part of routine and to increase amount of physical activity.;Long Term: Exercising regularly at least 3-5 days a week.       Increase Strength and Stamina  Yes       Intervention  Provide advice, education, support and counseling about physical activity/exercise needs.;Develop an individualized exercise prescription for aerobic and resistive training based on initial evaluation findings, risk stratification, comorbidities and participant's personal goals.       Expected Outcomes  Short Term: Increase workloads from initial exercise prescription for resistance, speed, and METs.;Short Term: Perform resistance training exercises routinely during rehab and add in resistance training at home;Long Term: Improve cardiorespiratory fitness, muscular endurance and strength as measured by increased METs and functional capacity (6MWT)       Able to understand and use rate of perceived exertion (RPE) scale  Yes       Intervention  Provide education and explanation on how to use RPE scale       Expected Outcomes  Short Term: Able to use RPE daily in rehab to express subjective intensity level;Long Term:  Able to use RPE to guide intensity level when exercising independently       Able to understand and use Dyspnea scale  Yes       Intervention  Provide education and explanation on how to use Dyspnea scale       Expected Outcomes  Short Term: Able to use Dyspnea scale daily in rehab to express subjective sense of shortness of breath during exertion;Long Term: Able to use Dyspnea scale to guide intensity level when exercising independently       Knowledge and understanding of Target Heart Rate Range (THRR)  Yes       Intervention  Provide education and explanation of THRR including how the numbers were predicted and where they are located for reference        Expected Outcomes  Short Term: Able to state/look up THRR;Long Term: Able to use THRR to govern intensity when exercising independently;Short Term: Able to use daily as guideline for intensity in rehab       Able to check pulse independently  Yes       Intervention  Provide education and demonstration on how to check pulse in carotid and radial arteries.;Review the importance of being able to check your own pulse for safety during independent exercise       Expected Outcomes  Short Term: Able to explain why pulse checking is important during independent exercise;Long Term: Able to check pulse independently and accurately       Understanding of Exercise Prescription  Yes       Intervention  Provide education, explanation, and written materials on patient's individual exercise prescription       Expected Outcomes  Short Term: Able to explain program exercise prescription;Long Term: Able to explain home exercise prescription to exercise independently          Copy of goals given to participant.

## 2019-08-05 ENCOUNTER — Other Ambulatory Visit: Payer: Self-pay

## 2019-08-05 ENCOUNTER — Encounter: Payer: 59 | Admitting: *Deleted

## 2019-08-05 DIAGNOSIS — I214 Non-ST elevation (NSTEMI) myocardial infarction: Secondary | ICD-10-CM

## 2019-08-05 DIAGNOSIS — I252 Old myocardial infarction: Secondary | ICD-10-CM | POA: Diagnosis not present

## 2019-08-05 LAB — GLUCOSE, CAPILLARY
Glucose-Capillary: 272 mg/dL — ABNORMAL HIGH (ref 70–99)
Glucose-Capillary: 206 mg/dL — ABNORMAL HIGH (ref 70–99)

## 2019-08-05 NOTE — Progress Notes (Signed)
Daily Session Note  Patient Details  Name: Cheyenne Lopez MRN: [id number] Date of Birth: May 02, 1950 Referring Provider:     Cardiac Rehab from 07/30/2019 in [facility] Cardiac and Pulmonary Rehab  Referring Provider  Neoma Laming MD      Encounter Date: 08/05/2019  Check In: Session Check In - 08/05/19 1020      Check-In   Supervising physician immediately available to respond to emergencies  See telemetry face sheet for immediately available ER MD    Location  [facility]-Cardiac & Pulmonary Rehab    Staff Present  Renita Papa, RN BSN;Joseph [address] [place], [place], RCEP, CCRP, CCET    Virtual Visit  No    Medication changes reported      No    Fall or balance concerns reported     No    Warm-up and Cool-down  Performed on first and last piece of equipment    Resistance Training Performed  Yes    VAD Patient?  No    PAD/SET Patient?  No      Pain Assessment   Currently in Pain?  No/denies          Social History   Tobacco Use  Smoking Status Never Smoker  Smokeless Tobacco Never Used    Goals Met:  Independence with exercise equipment Exercise tolerated well No report of cardiac concerns or symptoms Strength training completed today  Goals Unmet:  Not Applicable  Comments: First full day of exercise!  Patient was oriented to gym and equipment including functions, settings, policies, and procedures.  Patient's individual exercise prescription and treatment plan were reviewed.  All starting workloads were established based on the results of the 6 minute walk test done at initial orientation visit.  The plan for exercise progression was also introduced and progression will be customized based on patient's performance and goals.     Dr. Emily Filbert is Medical Director for [place] and [facility].

## 2019-08-07 ENCOUNTER — Other Ambulatory Visit: Payer: Self-pay

## 2019-08-07 ENCOUNTER — Encounter: Payer: 59 | Admitting: *Deleted

## 2019-08-07 DIAGNOSIS — I214 Non-ST elevation (NSTEMI) myocardial infarction: Secondary | ICD-10-CM

## 2019-08-07 DIAGNOSIS — I252 Old myocardial infarction: Secondary | ICD-10-CM | POA: Diagnosis not present

## 2019-08-07 LAB — GLUCOSE, CAPILLARY
Glucose-Capillary: 174 mg/dL — ABNORMAL HIGH (ref 70–99)
Glucose-Capillary: 94 mg/dL (ref 70–99)

## 2019-08-07 NOTE — Progress Notes (Signed)
Daily Session Note  Patient Details  Name: Cheyenne Lopez MRN: [id number] Date of Birth: March 24, 1951 Referring Provider:     Cardiac Rehab from 07/30/2019 in [facility] Cardiac and Pulmonary Rehab  Referring Provider  Neoma Laming MD      Encounter Date: 08/07/2019  Check In: Session Check In - 08/07/19 1118      Check-In   Supervising physician immediately available to respond to emergencies  See telemetry face sheet for immediately available ER MD    Location  [facility]-Cardiac & Pulmonary Rehab    Staff Present  Basilia Jumbo, RN, BSN;Jessica Luan Pulling, MA, RCEP, CCRP, CCET;Meredith Sherryll Burger, RN BSN;Joseph [place] [employer]    Virtual Visit  No    Medication changes reported      No    Fall or balance concerns reported     No    Warm-up and Cool-down  Performed on first and last piece of equipment    Resistance Training Performed  Yes    VAD Patient?  No    PAD/SET Patient?  No      Pain Assessment   Currently in Pain?  No/denies          Social History   Tobacco Use  Smoking Status Never Smoker  Smokeless Tobacco Never Used    Goals Met:  Independence with exercise equipment Exercise tolerated well No report of cardiac concerns or symptoms  Goals Unmet:  Not Applicable  Comments: Pt able to follow exercise prescription today without complaint.  Will continue to monitor for progression.   Dr. Emily Filbert is Medical Director for [place] and [facility].

## 2019-08-10 ENCOUNTER — Other Ambulatory Visit: Payer: Self-pay

## 2019-08-10 ENCOUNTER — Encounter: Payer: 59 | Admitting: *Deleted

## 2019-08-10 DIAGNOSIS — I214 Non-ST elevation (NSTEMI) myocardial infarction: Secondary | ICD-10-CM

## 2019-08-10 DIAGNOSIS — I252 Old myocardial infarction: Secondary | ICD-10-CM | POA: Diagnosis not present

## 2019-08-10 NOTE — Progress Notes (Signed)
Daily Session Note  Patient Details  Name: Cheyenne Lopez MRN: [id number] Date of Birth: 05/13/1950 Referring Provider:     Cardiac Rehab from 07/30/2019 in [facility] Cardiac and Pulmonary Rehab  Referring Provider  Neoma Laming MD      Encounter Date: 08/10/2019  Check In: Session Check In - 08/10/19 1012      Check-In   Supervising physician immediately available to respond to emergencies  See telemetry face sheet for immediately available ER MD    Location  [facility]-Cardiac & Pulmonary Rehab    Staff Present  Justin Mend RCP,RRT,BSRT;Heath Lark, RN, BSN, CCRP    Virtual Visit  No    Medication changes reported      No    Fall or balance concerns reported     No    Warm-up and Cool-down  Performed on first and last piece of equipment    Resistance Training Performed  Yes    VAD Patient?  No    PAD/SET Patient?  No      Pain Assessment   Currently in Pain?  No/denies          Social History   Tobacco Use  Smoking Status Never Smoker  Smokeless Tobacco Never Used    Goals Met:  Exercise tolerated well No report of cardiac concerns or symptoms  Goals Unmet:  Not Applicable  Comments: Pt able to follow exercise prescription today without complaint.  Will continue to monitor for progression.    Dr. Emily Filbert is Medical Director for [place] and [facility].

## 2019-08-12 ENCOUNTER — Encounter: Payer: Self-pay | Admitting: Family Medicine

## 2019-08-12 ENCOUNTER — Other Ambulatory Visit: Payer: Self-pay

## 2019-08-12 ENCOUNTER — Encounter: Payer: 59 | Admitting: *Deleted

## 2019-08-12 ENCOUNTER — Encounter: Payer: Self-pay | Admitting: *Deleted

## 2019-08-12 DIAGNOSIS — I214 Non-ST elevation (NSTEMI) myocardial infarction: Secondary | ICD-10-CM

## 2019-08-12 DIAGNOSIS — I252 Old myocardial infarction: Secondary | ICD-10-CM | POA: Diagnosis not present

## 2019-08-12 NOTE — Progress Notes (Signed)
Daily Session Note  Patient Details  Name: Shayonna Ocampo MRN: [id number] Date of Birth: [birth year] Referring Provider:     Cardiac Rehab from 07/30/2019 in [facility] Cardiac and Pulmonary Rehab  Referring Provider  Neoma Laming MD      Encounter Date: 08/12/2019  Check In: Session Check In - 08/12/19 1015      Check-In   Supervising physician immediately available to respond to emergencies  See telemetry face sheet for immediately available ER MD    Location  [facility]-Cardiac & Pulmonary Rehab    Staff Present  Renita Papa, RN BSN;Joseph [address] [place], [place], [place], CCRP, CCET    Virtual Visit  No    Medication changes reported      No    Fall or balance concerns reported     No    Warm-up and Cool-down  Performed on first and last piece of equipment    Resistance Training Performed  Yes    VAD Patient?  No    PAD/SET Patient?  No      Pain Assessment   Currently in Pain?  No/denies          Social History   Tobacco Use  Smoking Status Never Smoker  Smokeless Tobacco Never Used    Goals Met:  Independence with exercise equipment Exercise tolerated well No report of cardiac concerns or symptoms Strength training completed today  Goals Unmet:  Not Applicable  Comments: Pt able to follow exercise prescription today without complaint.  Will continue to monitor for progression.    Dr. Emily Filbert is Medical Director for [place] and [facility].

## 2019-08-12 NOTE — Progress Notes (Signed)
Cardiac Individual Treatment Plan  Patient Details  Name: Mija Effertz MRN: [id number] Date of Birth: 08-09-1950 Referring Provider:     Cardiac Rehab from 07/30/2019 in [facility] Cardiac and Pulmonary Rehab  Referring Provider  Neoma Laming MD      Initial Encounter Date:    Cardiac Rehab from 07/30/2019 in [facility] Cardiac and Pulmonary Rehab  Date  07/30/19      Visit Diagnosis: NSTEMI (non-ST elevated myocardial infarction) [facility])  Patient's Home Medications on Admission:  Current Outpatient Medications:  .  ascorbic acid (VITAMIN C) 100 MG tablet, Take 100 mg by mouth daily., Disp: , Rfl:  .  aspirin 81 MG chewable tablet, Chew 1 tablet (81 mg total) by mouth daily., Disp: 30 tablet, Rfl: 0 .  atorvastatin (LIPITOR) 80 MG tablet, Take 1 tablet (80 mg total) by mouth daily., Disp: 90 tablet, Rfl: 0 .  Biotin 10 MG TABS, Take 10 mg by mouth daily., Disp: , Rfl:  .  chlorhexidine (PERIDEX) 0.12 % solution, 30 mLs by Mouth Rinse route See admin instructions. Rinse as needed for mouth sores., Disp: , Rfl:  .  Coenzyme Q10 (CO Q 10 PO), Take 1 capsule by mouth daily., Disp: , Rfl:  .  docusate sodium (COLACE) 100 MG capsule, Take 1 capsule (100 mg total) by mouth daily as needed., Disp: 30 capsule, Rfl: 2 .  insulin detemir (LEVEMIR) 100 UNIT/ML injection, Inject 18 Units into the skin See admin instructions. For use when not using the insulin pump., Disp: , Rfl:  .  Insulin Human (INSULIN PUMP) SOLN, Inject into the skin., Disp: , Rfl:  .  insulin lispro (HUMALOG) 100 UNIT/ML injection, Inject into the skin 3 (three) times daily before meals., Disp: , Rfl:  .  latanoprost (XALATAN) 0.005 % ophthalmic solution, 1 drop at bedtime., Disp: , Rfl:  .  Multiple Vitamin (MULTIVITAMIN) capsule, Take 1 capsule by mouth daily., Disp: , Rfl:  .  nitroGLYCERIN (NITROSTAT) 0.4 MG SL tablet, Place 1 tablet (0.4 mg total) under the tongue every 5 (five) minutes as needed for chest pain., Disp: 9 tablet,  Rfl: 12 .  oxyCODONE-acetaminophen (PERCOCET) 5-325 MG tablet, Take 1 tablet by mouth every 8 (eight) hours as needed. (Patient not taking: Reported on 07/29/2019), Disp: 20 tablet, Rfl: 0 .  thyroid (ARMOUR) 65 MG tablet, Take 65 mg by mouth daily., Disp: , Rfl:  .  ticagrelor (BRILINTA) 90 MG TABS tablet, Take 1 tablet (90 mg total) by mouth 2 (two) times daily., Disp: 60 tablet, Rfl: 3 .  Travoprost, BAK Free, (TRAVATAN) 0.004 % SOLN ophthalmic solution, Place 1 drop into both eyes at bedtime., Disp: , Rfl:  .  traZODone (DESYREL) 50 MG tablet, Take 25 mg by mouth at bedtime as needed., Disp: , Rfl:  .  triamcinolone (NASACORT) 55 MCG/ACT AERO nasal inhaler, Place 1 spray into the nose daily as needed., Disp: , Rfl:  .  valsartan (DIOVAN) 40 MG tablet, Take 20 mg by mouth daily. , Disp: , Rfl: 11 .  zinc sulfate (ZINC-220) 220 (50 Zn) MG capsule, Take 220 mg by mouth daily., Disp: , Rfl:   Past Medical History: Past Medical History:  Diagnosis Date  . Hypertension   . Hypothyroid   . Type 1 diabetes (HCC)     Tobacco Use: Social History   Tobacco Use  Smoking Status Never Smoker  Smokeless Tobacco Never Used    Labs: Recent Review Scientist, physiological    Labs for ITP Cardiac  and Pulmonary Rehab Latest Ref Rng & Units 07/16/2019   Cholestrol 0 - 200 mg/dL 135   LDLCALC 0 - 99 mg/dL 79   HDL >40 mg/dL 45   Trlycerides <150 mg/dL 55   Hemoglobin A1c 4.8 - 5.6 % 6.9(H)       Exercise Target Goals: Exercise Program Goal: Individual exercise prescription set using results from initial 6 min walk test and THRR while considering  patient's activity barriers and safety.   Exercise Prescription Goal: Initial exercise prescription builds to 30-45 minutes a day of aerobic activity, 2-3 days per week.  Home exercise guidelines will be given to patient during program as part of exercise prescription that the participant will acknowledge.   Education: Aerobic Exercise & Resistance  Training: - Gives group verbal and written instruction on the various components of exercise. Focuses on aerobic and resistive training programs and the benefits of this training and how to safely progress through these programs..   Education: Exercise & Equipment Safety: - Individual verbal instruction and demonstration of equipment use and safety with use of the equipment.   Cardiac Rehab from 07/29/2019 in [facility] Cardiac and Pulmonary Rehab  Date  07/29/19  Educator  [facility]  Instruction Review Code  1- Verbalizes Understanding      Education: Exercise Physiology & General Exercise Guidelines: - Group verbal and written instruction with models to review the exercise physiology of the cardiovascular system and associated critical values. Provides general exercise guidelines with specific guidelines to those with heart or lung disease.    Education: Flexibility, Balance, Mind/Body Relaxation: Provides group verbal/written instruction on the benefits of flexibility and balance training, including mind/body exercise modes such as yoga, pilates and tai chi.  Demonstration and skill practice provided.   Activity Barriers & Risk Stratification: Activity Barriers & Cardiac Risk Stratification - 07/30/19 1308      Activity Barriers & Cardiac Risk Stratification   Activity Barriers  Shortness of Breath;Deconditioning;Muscular Weakness;Other (comment);Balance Concerns    Comments  previous vertigo, meds sometimes made it difficult to walk    Cardiac Risk Stratification  Moderate       6 Minute Walk: 6 Minute Walk    Row Name 07/30/19 1305         6 Minute Walk   Phase  Initial     Distance  1265 feet     Walk Time  6 minutes     # of Rest Breaks  0     MPH  2.4     METS  2.75     RPE  11     VO2 Peak  9.64     Symptoms  Yes (comment)     Comments  fatigue, r hip pain/dull ache 2-3/10     Resting HR  63 bpm     Resting BP  138/72     Resting Oxygen Saturation   99 %     Exercise  Oxygen Saturation  during 6 min walk  96 %     Max Ex. HR  87 bpm     Max Ex. BP  144/74     2 Minute Post BP  128/70        Oxygen Initial Assessment:   Oxygen Re-Evaluation:   Oxygen Discharge (Final Oxygen Re-Evaluation):   Initial Exercise Prescription: Initial Exercise Prescription - 07/30/19 1300      Date of Initial Exercise RX and Referring Provider   Date  07/30/19    Referring Provider  Neoma Laming MD      Treadmill   MPH  2.3    Grade  0    Minutes  15    METs  2.76      Elliptical   Level  1    Speed  2.3    Minutes  15    METs  2      REL-XR   Level  1    Speed  50    Minutes  15    METs  2      Prescription Details   Frequency (times per week)  3    Duration  Progress to 30 minutes of continuous aerobic without signs/symptoms of physical distress      Intensity   THRR 40-80% of Max Heartrate  99-134    Ratings of Perceived Exertion  11-13    Perceived Dyspnea  0-4      Progression   Progression  Continue to progress workloads to maintain intensity without signs/symptoms of physical distress.      Resistance Training   Training Prescription  Yes    Weight  3 lb    Reps  10-15       Perform Capillary Blood Glucose checks as needed.  Exercise Prescription Changes: Exercise Prescription Changes    Row Name 07/30/19 1300             Response to Exercise   Blood Pressure (Admit)  138/72       Blood Pressure (Exercise)  144/74       Blood Pressure (Exit)  128/70       Heart Rate (Admit)  63 bpm       Heart Rate (Exercise)  87 bpm       Heart Rate (Exit)  63 bpm       Oxygen Saturation (Admit)  99 %       Oxygen Saturation (Exercise)  96 %       Rating of Perceived Exertion (Exercise)  11       Symptoms  fatigue, r hip pain dull ache 2-3/10       Comments  walk test results          Exercise Comments:   Exercise Goals and Review: Exercise Goals    Row Name 07/30/19 1312             Exercise Goals   Increase  Physical Activity  Yes       Intervention  Provide advice, education, support and counseling about physical activity/exercise needs.;Develop an individualized exercise prescription for aerobic and resistive training based on initial evaluation findings, risk stratification, comorbidities and participant's personal goals.       Expected Outcomes  Short Term: Attend rehab on a regular basis to increase amount of physical activity.;Long Term: Add in home exercise to make exercise part of routine and to increase amount of physical activity.;Long Term: Exercising regularly at least 3-5 days a week.       Increase Strength and Stamina  Yes       Intervention  Provide advice, education, support and counseling about physical activity/exercise needs.;Develop an individualized exercise prescription for aerobic and resistive training based on initial evaluation findings, risk stratification, comorbidities and participant's personal goals.       Expected Outcomes  Short Term: Increase workloads from initial exercise prescription for resistance, speed, and METs.;Short Term: Perform resistance training exercises routinely during rehab and add in resistance training at home;Long Term: Improve cardiorespiratory fitness,  muscular endurance and strength as measured by increased METs and functional capacity (6MWT)       Able to understand and use rate of perceived exertion (RPE) scale  Yes       Intervention  Provide education and explanation on how to use RPE scale       Expected Outcomes  Short Term: Able to use RPE daily in rehab to express subjective intensity level;Long Term:  Able to use RPE to guide intensity level when exercising independently       Able to understand and use Dyspnea scale  Yes       Intervention  Provide education and explanation on how to use Dyspnea scale       Expected Outcomes  Short Term: Able to use Dyspnea scale daily in rehab to express subjective sense of shortness of breath during  exertion;Long Term: Able to use Dyspnea scale to guide intensity level when exercising independently       Knowledge and understanding of Target Heart Rate Range (THRR)  Yes       Intervention  Provide education and explanation of THRR including how the numbers were predicted and where they are located for reference       Expected Outcomes  Short Term: Able to state/look up THRR;Long Term: Able to use THRR to govern intensity when exercising independently;Short Term: Able to use daily as guideline for intensity in rehab       Able to check pulse independently  Yes       Intervention  Provide education and demonstration on how to check pulse in carotid and radial arteries.;Review the importance of being able to check your own pulse for safety during independent exercise       Expected Outcomes  Short Term: Able to explain why pulse checking is important during independent exercise;Long Term: Able to check pulse independently and accurately       Understanding of Exercise Prescription  Yes       Intervention  Provide education, explanation, and written materials on patient's individual exercise prescription       Expected Outcomes  Short Term: Able to explain program exercise prescription;Long Term: Able to explain home exercise prescription to exercise independently          Exercise Goals Re-Evaluation : Exercise Goals Re-Evaluation    Winifred Name 08/05/19 1023             Exercise Goal Re-Evaluation   Exercise Goals Review  Increase Physical Activity;Able to understand and use rate of perceived exertion (RPE) scale;Knowledge and understanding of Target Heart Rate Range (THRR);Increase Strength and Stamina;Able to check pulse independently       Comments  Reviewed RPE and dyspnea scales, THR and program prescription with pt today.  Pt voiced understanding and was given a copy of goals to take home.       Expected Outcomes  Short: Use RPE daily to regulate intensity. Long: Follow program  prescription in THR.          Discharge Exercise Prescription (Final Exercise Prescription Changes): Exercise Prescription Changes - 07/30/19 1300      Response to Exercise   Blood Pressure (Admit)  138/72    Blood Pressure (Exercise)  144/74    Blood Pressure (Exit)  128/70    Heart Rate (Admit)  63 bpm    Heart Rate (Exercise)  87 bpm    Heart Rate (Exit)  63 bpm    Oxygen Saturation (Admit)  99 %  Oxygen Saturation (Exercise)  96 %    Rating of Perceived Exertion (Exercise)  11    Symptoms  fatigue, r hip pain dull ache 2-3/10    Comments  walk test results       Nutrition:  Target Goals: Understanding of nutrition guidelines, daily intake of sodium <1557m, cholesterol <2020m calories 30% from fat and 7% or less from saturated fats, daily to have 5 or more servings of fruits and vegetables.  Education: Controlling Sodium/Reading Food Labels -Group verbal and written material supporting the discussion of sodium use in heart healthy nutrition. Review and explanation with models, verbal and written materials for utilization of the food label.   Education: General Nutrition Guidelines/Fats and Fiber: -Group instruction provided by verbal, written material, models and posters to present the general guidelines for heart healthy nutrition. Gives an explanation and review of dietary fats and fiber.   Biometrics: Pre Biometrics - 07/30/19 1313      Pre Biometrics   Height  5' 0.75" (1.543 m)    Weight  146 lb 14.4 oz (66.6 kg)    BMI (Calculated)  27.99    Single Leg Stand  13.6 seconds        Nutrition Therapy Plan and Nutrition Goals:   Nutrition Assessments: Nutrition Assessments - 07/30/19 1314      MEDFICTS Scores   Pre Score  13       MEDIFICTS Score Key:          ?70 Need to make dietary changes          40-70 Heart Healthy Diet         ? 40 Therapeutic Level Cholesterol Diet  Nutrition Goals Re-Evaluation:   Nutrition Goals Discharge (Final  Nutrition Goals Re-Evaluation):   Psychosocial: Target Goals: Acknowledge presence or absence of significant depression and/or stress, maximize coping skills, provide positive support system. Participant is able to verbalize types and ability to use techniques and skills needed for reducing stress and depression.   Education: Depression - Provides group verbal and written instruction on the correlation between heart/lung disease and depressed mood, treatment options, and the stigmas associated with seeking treatment.   Education: Sleep Hygiene -Provides group verbal and written instruction about how sleep can affect your health.  Define sleep hygiene, discuss sleep cycles and impact of sleep habits. Review good sleep hygiene tips.     Education: Stress and Anxiety: - Provides group verbal and written instruction about the health risks of elevated stress and causes of high stress.  Discuss the correlation between heart/lung disease and anxiety and treatment options. Review healthy ways to manage with stress and anxiety.    Initial Review & Psychosocial Screening: Initial Psych Review & Screening - 07/29/19 1336      Initial Review   Current issues with  None Identified      Family Dynamics   Good Support System?  Yes    Comments  She can look  to her husband, son and borther inlaw for support. She has a positive outlook on her health.      Barriers   Psychosocial barriers to participate in program  There are no identifiable barriers or psychosocial needs.      Screening Interventions   Interventions  Encouraged to exercise;Provide feedback about the scores to participant;To provide support and resources with identified psychosocial needs    Expected Outcomes  Short Term goal: Utilizing psychosocial counselor, staff and physician to assist with identification of specific Stressors or current  issues interfering with healing process. Setting desired goal for each stressor or current  issue identified.;Short Term goal: Identification and review with participant of any Quality of Life or Depression concerns found by scoring the questionnaire.;Long Term Goal: Stressors or current issues are controlled or eliminated.;Long Term goal: The participant improves quality of Life and PHQ9 Scores as seen by post scores and/or verbalization of changes       Quality of Life Scores:  Quality of Life - 07/30/19 1313      Quality of Life   Select  Quality of Life      Quality of Life Scores   Health/Function Pre  24.46 %    Socioeconomic Pre  27 %    Psych/Spiritual Pre  26.57 %    Family Pre  26.4 %    GLOBAL Pre  25.74 %      Scores of 19 and below usually indicate a poorer quality of life in these areas.  A difference of  2-3 points is a clinically meaningful difference.  A difference of 2-3 points in the total score of the Quality of Life Index has been associated with significant improvement in overall quality of life, self-image, physical symptoms, and general health in studies assessing change in quality of life.  PHQ-9: Recent Review Flowsheet Data    Depression screen [facility] 2/9 07/30/2019   Decreased Interest 0   Down, Depressed, Hopeless 0   PHQ - 2 Score 0   Altered sleeping 1   Tired, decreased energy 3   Change in appetite 0   Feeling bad or failure about yourself  0   Trouble concentrating 1   Moving slowly or fidgety/restless 0   Suicidal thoughts 0   PHQ-9 Score 5   Difficult doing work/chores Somewhat difficult     Interpretation of Total Score  Total Score Depression Severity:  1-4 = Minimal depression, 5-9 = Mild depression, 10-14 = Moderate depression, 15-19 = Moderately severe depression, 20-27 = Severe depression   Psychosocial Evaluation and Intervention: Psychosocial Evaluation - 07/29/19 1401      Psychosocial Evaluation & Interventions   Interventions  Encouraged to exercise with the program and follow exercise prescription    Comments  Patient  has a positive outlook on her health and states no depression issues.    Expected Outcomes  Short: Attend [facility] stress management education to decrease stress. Long: Maintain exercise Post Heart[facility] to keep stress at a minimum.    Continue Psychosocial Services   Follow up required by staff       Psychosocial Re-Evaluation:   Psychosocial Discharge (Final Psychosocial Re-Evaluation):   Vocational Rehabilitation: Provide vocational rehab assistance to qualifying candidates.   Vocational Rehab Evaluation & Intervention:   Education: Education Goals: Education classes will be provided on a variety of topics geared toward better understanding of heart health and risk factor modification. Participant will state understanding/return demonstration of topics presented as noted by education test scores.  Learning Barriers/Preferences: Learning Barriers/Preferences - 07/29/19 1338      Learning Barriers/Preferences   Learning Barriers  None    Learning Preferences  None       General Cardiac Education Topics:  AED/CPR: - Group verbal and written instruction with the use of models to demonstrate the basic use of the AED with the basic ABC's of resuscitation.   Anatomy & Physiology of the Heart: - Group verbal and written instruction and models provide basic cardiac anatomy and physiology, with the coronary electrical and arterial  systems. Review of Valvular disease and Heart Failure   Cardiac Procedures: - Group verbal and written instruction to review commonly prescribed medications for heart disease. Reviews the medication, class of the drug, and side effects. Includes the steps to properly store meds and maintain the prescription regimen. (beta blockers and nitrates)   Cardiac Medications I: - Group verbal and written instruction to review commonly prescribed medications for heart disease. Reviews the medication, class of the drug, and side effects. Includes the steps to  properly store meds and maintain the prescription regimen.   Cardiac Medications II: -Group verbal and written instruction to review commonly prescribed medications for heart disease. Reviews the medication, class of the drug, and side effects. (all other drug classes)    Go Sex-Intimacy & Heart Disease, Get SMART - Goal Setting: - Group verbal and written instruction through game format to discuss heart disease and the return to sexual intimacy. Provides group verbal and written material to discuss and apply goal setting through the application of the S.M.A.R.T. Method.   Other Matters of the Heart: - Provides group verbal, written materials and models to describe Stable Angina and Peripheral Artery. Includes description of the disease process and treatment options available to the cardiac patient.   Infection Prevention: - Provides verbal and written material to individual with discussion of infection control including proper hand washing and proper equipment cleaning during exercise session.   Cardiac Rehab from 07/29/2019 in [facility] Cardiac and Pulmonary Rehab  Date  07/29/19  Educator  [facility]  Instruction Review Code  1- Verbalizes Understanding      Falls Prevention: - Provides verbal and written material to individual with discussion of falls prevention and safety.   Cardiac Rehab from 07/29/2019 in [facility] Cardiac and Pulmonary Rehab  Date  07/29/19  Educator  [facility]  Instruction Review Code  1- Verbalizes Understanding      Other: -Provides group and verbal instruction on various topics (see comments)   Knowledge Questionnaire Score: Knowledge Questionnaire Score - 07/30/19 1314      Knowledge Questionnaire Score   Pre Score  23/26 Education Focus: exercise       Core Components/Risk Factors/Patient Goals at Admission: Personal Goals and Risk Factors at Admission - 07/30/19 1314      Core Components/Risk Factors/Patient Goals on Admission    Weight Management  Yes;Weight Loss     Intervention  Weight Management: Develop a combined nutrition and exercise program designed to reach desired caloric intake, while maintaining appropriate intake of nutrient and fiber, sodium and fats, and appropriate energy expenditure required for the weight goal.;Weight Management: Provide education and appropriate resources to help participant work on and attain dietary goals.    Admit Weight  146 lb 14.4 oz (66.6 kg)    Goal Weight: Short Term  141 lb (64 kg)    Goal Weight: Long Term  135 lb (61.2 kg)    Expected Outcomes  Short Term: Continue to assess and modify interventions until short term weight is achieved;Weight Loss: Understanding of general recommendations for a balanced deficit meal plan, which promotes 1-2 lb weight loss per week and includes a negative energy balance of [id number] kcal/d;Understanding recommendations for meals to include 15-35% energy as protein, 25-35% energy from fat, 35-60% energy from carbohydrates, less than 279m of dietary cholesterol, 20-35 gm of total fiber daily;Understanding of distribution of calorie intake throughout the day with the consumption of 4-5 meals/snacks    Diabetes  Yes    Intervention  Provide education  about signs/symptoms and action to take for hypo/hyperglycemia.;Provide education about proper nutrition, including hydration, and aerobic/resistive exercise prescription along with prescribed medications to achieve blood glucose in normal ranges: Fasting glucose 65-99 mg/dL    Expected Outcomes  Short Term: Participant verbalizes understanding of the signs/symptoms and immediate care of hyper/hypoglycemia, proper foot care and importance of medication, aerobic/resistive exercise and nutrition plan for blood glucose control.;Long Term: Attainment of HbA1C < 7%.    Hypertension  Yes    Intervention  Provide education on lifestyle modifcations including regular physical activity/exercise, weight management, moderate sodium restriction and  increased consumption of fresh fruit, vegetables, and low fat dairy, alcohol moderation, and smoking cessation.;Monitor prescription use compliance.    Expected Outcomes  Short Term: Continued assessment and intervention until BP is < 140/61m HG in hypertensive participants. < 130/827mHG in hypertensive participants with diabetes, heart failure or chronic kidney disease.;Long Term: Maintenance of blood pressure at goal levels.    Lipids  Yes    Intervention  Provide education and support for participant on nutrition & aerobic/resistive exercise along with prescribed medications to achieve LDL <7045mHDL >43m49m  Expected Outcomes  Short Term: Participant states understanding of desired cholesterol values and is compliant with medications prescribed. Participant is following exercise prescription and nutrition guidelines.;Long Term: Cholesterol controlled with medications as prescribed, with individualized exercise RX and with personalized nutrition plan. Value goals: LDL < 70mg79mL > 40 mg.       Education:Diabetes - Individual verbal and written instruction to review signs/symptoms of diabetes, desired ranges of glucose level fasting, after meals and with exercise. Acknowledge that pre and post exercise glucose checks will be done for 3 sessions at entry of program.   [facility]  Date  07/29/19  Educator  [facility] Code  1- Verbalizes Understanding      Education: Know Your Numbers and Risk Factors: -Group verbal and written instruction about important numbers in your health.  Discussion of what are risk factors and how they play a role in the disease process.  Review of Cholesterol, Blood Pressure, Diabetes, and BMI and the role they play in your overall health.   Core Components/Risk Factors/Patient Goals Review:    Core Components/Risk Factors/Patient Goals at Discharge (Final Review):    ITP Comments: ITP Comments     Row Name 07/29/19 1344 07/30/19 1304 08/05/19 1022       ITP Comments  Virtual Visit Completed. Patient informed of EP and RD schedule. Visit diagnosis can be found in [facility]/2021  Completed 6MWT and gym orientation.  Initial ITP created and sent for review to Dr. Mark Emily Filbertical Director.  First full day of exercise!  Patient was oriented to gym and equipment including functions, settings, policies, and procedures.  Patient's individual exercise prescription and treatment plan were reviewed.  All starting workloads were established based on the results of the 6 minute walk test done at initial orientation visit.  The plan for exercise progression was also introduced and progression will be customized based on patient's performance and goals        Comments:

## 2019-08-14 ENCOUNTER — Encounter: Payer: 59 | Admitting: *Deleted

## 2019-08-14 ENCOUNTER — Other Ambulatory Visit: Payer: Self-pay

## 2019-08-14 DIAGNOSIS — I214 Non-ST elevation (NSTEMI) myocardial infarction: Secondary | ICD-10-CM

## 2019-08-14 DIAGNOSIS — I252 Old myocardial infarction: Secondary | ICD-10-CM | POA: Diagnosis not present

## 2019-08-14 NOTE — Progress Notes (Signed)
Daily Session Note  Patient Details  Name: Cheyenne Lopez MRN: [id number] Date of Birth: 1950-10-10 Referring Provider:     Cardiac Rehab from 07/30/2019 in [facility] Cardiac and Pulmonary Rehab  Referring Provider  Neoma Laming MD      Encounter Date: 08/14/2019  Check In: Session Check In - 08/14/19 0952      Check-In   Supervising physician immediately available to respond to emergencies  See telemetry face sheet for immediately available ER MD    Location  [facility]-Cardiac & Pulmonary Rehab    Staff Present  Renita Papa, RN BSN;Joseph [address] [place], [place], RCEP, CCRP, CCET    Virtual Visit  No    Medication changes reported      No    Fall or balance concerns reported     No    Warm-up and Cool-down  Performed on first and last piece of equipment    Resistance Training Performed  Yes    VAD Patient?  No    PAD/SET Patient?  No      Pain Assessment   Currently in Pain?  No/denies          Social History   Tobacco Use  Smoking Status Never Smoker  Smokeless Tobacco Never Used    Goals Met:  Independence with exercise equipment Exercise tolerated well No report of cardiac concerns or symptoms Strength training completed today  Goals Unmet:  Not Applicable  Comments: Pt able to follow exercise prescription today without complaint.  Will continue to monitor for progression.    Dr. Emily Filbert is Medical Director for [place] and [facility].

## 2019-08-17 ENCOUNTER — Encounter: Payer: 59 | Admitting: *Deleted

## 2019-08-17 ENCOUNTER — Other Ambulatory Visit: Payer: Self-pay

## 2019-08-17 DIAGNOSIS — I214 Non-ST elevation (NSTEMI) myocardial infarction: Secondary | ICD-10-CM

## 2019-08-17 DIAGNOSIS — I252 Old myocardial infarction: Secondary | ICD-10-CM | POA: Diagnosis not present

## 2019-08-17 NOTE — Progress Notes (Signed)
Daily Session Note  Patient Details  Name: Cheyenne Lopez MRN: [id number] Date of Birth: 1951/02/24 Referring Provider:     Cardiac Rehab from 07/30/2019 in [facility] Cardiac and Pulmonary Rehab  Referring Provider  Neoma Laming MD      Encounter Date: 08/17/2019  Check In: Session Check In - 08/17/19 1018      Check-In   Supervising physician immediately available to respond to emergencies  See telemetry face sheet for immediately available ER MD    Location  [facility]-Cardiac & Pulmonary Rehab    Staff Present  Justin Mend RCP,RRT,BSRT;Dinita Migliaccio Sherryll Burger, RN Moises Blood, BS, ACSM CEP, Exercise Physiologist    Virtual Visit  No    Medication changes reported      No    Fall or balance concerns reported     No    Warm-up and Cool-down  Performed on first and last piece of equipment    Resistance Training Performed  Yes    VAD Patient?  No    PAD/SET Patient?  No      Pain Assessment   Currently in Pain?  No/denies          Social History   Tobacco Use  Smoking Status Never Smoker  Smokeless Tobacco Never Used    Goals Met:  Independence with exercise equipment Exercise tolerated well No report of cardiac concerns or symptoms Strength training completed today  Goals Unmet:  Not Applicable  Comments: Pt able to follow exercise prescription today without complaint.  Will continue to monitor for progression.    Dr. Emily Filbert is Medical Director for [place] and [facility].

## 2019-08-21 ENCOUNTER — Other Ambulatory Visit: Payer: Self-pay

## 2019-08-26 ENCOUNTER — Other Ambulatory Visit: Payer: Self-pay

## 2019-08-26 ENCOUNTER — Encounter: Payer: 59 | Attending: Cardiovascular Disease | Admitting: *Deleted

## 2019-08-26 DIAGNOSIS — I252 Old myocardial infarction: Secondary | ICD-10-CM | POA: Insufficient documentation

## 2019-08-26 DIAGNOSIS — I214 Non-ST elevation (NSTEMI) myocardial infarction: Secondary | ICD-10-CM

## 2019-08-26 NOTE — Progress Notes (Signed)
Daily Session Note  Patient Details  Name: Cheyenne Lopez MRN: [id number] Date of Birth: 01/29/1951 Referring Provider:     Cardiac Rehab from 07/30/2019 in [facility] Cardiac [facility] Pulmonary Rehab  Referring Provider  Neoma Laming MD      Encounter Date: 08/26/2019  Check In: Session Check In - 08/26/19 1011      Check-In   Supervising physician immediately available to respond to emergencies  See telemetry face sheet for immediately available ER MD    Location  [facility]-Cardiac & Pulmonary Rehab    Staff Present  Nyoka Cowden, RN, BSN, [place], MA, RCEP, CCRP, [place], [place], ACSM CEP, Exercise Physiologist    Virtual Visit  No    Medication changes reported      No    Fall or balance concerns reported     No    Tobacco Cessation  No Change    Warm-up and Cool-down  Performed on first and last piece of equipment    VAD Patient?  No    PAD/SET Patient?  No          Social History   Tobacco Use  Smoking Status Never Smoker  Smokeless Tobacco Never Used    Goals Met:  Independence with exercise equipment Exercise tolerated well No report of cardiac concerns or symptoms  Goals Unmet:  Not Applicable  Comments: Pt able to follow exercise prescription today without complaint.  Will continue to monitor for progression.    Dr. Emily Filbert is Medical Director for [place] and [facility].

## 2019-08-28 ENCOUNTER — Encounter: Payer: 59 | Admitting: *Deleted

## 2019-08-28 ENCOUNTER — Other Ambulatory Visit: Payer: Self-pay

## 2019-08-28 DIAGNOSIS — I214 Non-ST elevation (NSTEMI) myocardial infarction: Secondary | ICD-10-CM

## 2019-08-28 DIAGNOSIS — I252 Old myocardial infarction: Secondary | ICD-10-CM | POA: Diagnosis not present

## 2019-08-28 NOTE — Progress Notes (Signed)
Daily Session Note  Patient Details  Name: Antonya Leeder MRN: [id number] Date of Birth: 01/05/1951 Referring Provider:     Cardiac Rehab from 07/30/2019 in [facility] Cardiac and Pulmonary Rehab  Referring Provider  Neoma Laming MD      Encounter Date: 08/28/2019  Check In:      Social History   Tobacco Use  Smoking Status Never Smoker  Smokeless Tobacco Never Used    Goals Met:  Independence with exercise equipment Exercise tolerated well No report of cardiac concerns or symptoms  Goals Unmet:  Not Applicable  Comments: Pt able to follow exercise prescription today without complaint.  Will continue to monitor for progression.    Dr. Emily Filbert is Medical Director for [place] and [facility].

## 2019-09-02 ENCOUNTER — Other Ambulatory Visit: Payer: Self-pay

## 2019-09-02 ENCOUNTER — Encounter: Payer: 59 | Admitting: *Deleted

## 2019-09-02 DIAGNOSIS — I214 Non-ST elevation (NSTEMI) myocardial infarction: Secondary | ICD-10-CM

## 2019-09-02 DIAGNOSIS — I252 Old myocardial infarction: Secondary | ICD-10-CM | POA: Diagnosis not present

## 2019-09-02 NOTE — Progress Notes (Signed)
Daily Session Note  Patient Details  Name: Cheyenne Lopez MRN: [id number] Date of Birth: 10-29-1950 Referring Provider:     Cardiac Rehab from 07/30/2019 in [facility] Cardiac and Pulmonary Rehab  Referring Provider  Neoma Laming MD      Encounter Date: 09/02/2019  Check In: Session Check In - 09/02/19 1005      Check-In   Supervising physician immediately available to respond to emergencies  See telemetry face sheet for immediately available ER MD    Location  [facility]-Cardiac & Pulmonary Rehab    Staff Present  Heath Lark, RN, BSN, CCRP;Melissa Caiola RDN, LDN;Joseph Hood RCP,RRT,BSRT    Virtual Visit  No    Medication changes reported      No    Fall or balance concerns reported     No    Warm-up and Cool-down  Performed on first and last piece of equipment    Resistance Training Performed  Yes    VAD Patient?  No    PAD/SET Patient?  No      Pain Assessment   Currently in Pain?  No/denies          Social History   Tobacco Use  Smoking Status Never Smoker  Smokeless Tobacco Never Used    Goals Met:  Independence with exercise equipment Exercise tolerated well No report of cardiac concerns or symptoms  Goals Unmet:  Not Applicable  Comments: Pt able to follow exercise prescription today without complaint.  Will continue to monitor for progression.    Dr. Emily Filbert is Medical Director for [place] and [facility].

## 2019-09-04 ENCOUNTER — Other Ambulatory Visit: Payer: Self-pay

## 2019-09-04 ENCOUNTER — Encounter: Payer: 59 | Admitting: *Deleted

## 2019-09-04 DIAGNOSIS — I252 Old myocardial infarction: Secondary | ICD-10-CM | POA: Diagnosis not present

## 2019-09-04 DIAGNOSIS — I214 Non-ST elevation (NSTEMI) myocardial infarction: Secondary | ICD-10-CM

## 2019-09-04 NOTE — Progress Notes (Signed)
Daily Session Note  Patient Details  Name: Cheyenne Lopez MRN: [id number] Date of Birth: Oct 09, 1950 Referring Provider:     Cardiac Rehab from 07/30/2019 in [facility] Cardiac and Pulmonary Rehab  Referring Provider Neoma Laming MD      Encounter Date: 09/04/2019  Check In:  Session Check In - 09/04/19 1054      Check-In   Supervising physician immediately available to respond to emergencies See telemetry face sheet for immediately available ER MD    Location [facility]-Cardiac & Pulmonary Rehab    Staff Present Heath Lark, RN, BSN, CCRP;Joseph Hood RCP,RRT,BSRT;Jessica [place], [place], [place], [place], CCET    Virtual Visit No    Medication changes reported     No    Fall or balance concerns reported    No    Warm-up and Cool-down Performed on first and last piece of equipment    Resistance Training Performed Yes    VAD Patient? No    PAD/SET Patient? No      Pain Assessment   Currently in Pain? No/denies              Social History   Tobacco Use  Smoking Status Never Smoker  Smokeless Tobacco Never Used    Goals Met:  Independence with exercise equipment Exercise tolerated well No report of cardiac concerns or symptoms  Goals Unmet:  Not Applicable  Comments: Pt able to follow exercise prescription today without complaint.  Will continue to monitor for progression.  Reviewed home exercise with pt today.  Pt plans to continue walking at home for exercise.  She is to increase her time to 30 min.  Reviewed THR, pulse, RPE, sign and symptoms, pulse oximetery and when to call 911 or MD.  Also discussed weather considerations and indoor options.  Pt voiced understanding.   Dr. Emily Filbert is Medical Director for [place] and [facility].

## 2019-09-07 ENCOUNTER — Encounter: Payer: 59 | Admitting: *Deleted

## 2019-09-07 ENCOUNTER — Other Ambulatory Visit: Payer: Self-pay

## 2019-09-07 DIAGNOSIS — I214 Non-ST elevation (NSTEMI) myocardial infarction: Secondary | ICD-10-CM

## 2019-09-07 DIAGNOSIS — I252 Old myocardial infarction: Secondary | ICD-10-CM | POA: Diagnosis not present

## 2019-09-07 NOTE — Progress Notes (Signed)
Daily Session Note  Patient Details  Name: Cheyenne Lopez MRN: [id number] Date of Birth: 1950-08-03 Referring Provider:     Cardiac Rehab from 07/30/2019 in [facility] Cardiac and Pulmonary Rehab  Referring Provider Neoma Laming MD      Encounter Date: 09/07/2019  Check In:  Session Check In - 09/07/19 1051      Check-In   Supervising physician immediately available to respond to emergencies See telemetry face sheet for immediately available ER MD    Location [facility]-Cardiac & Pulmonary Rehab    Staff Present Renita Papa, RN BSN;Joseph [address] [place], [place], ACSM CEP, Exercise Physiologist    Virtual Visit No    Medication changes reported     No    Fall or balance concerns reported    No    Warm-up and Cool-down Performed on first and last piece of equipment    Resistance Training Performed Yes    VAD Patient? No    PAD/SET Patient? No      Pain Assessment   Currently in Pain? No/denies              Social History   Tobacco Use  Smoking Status Never Smoker  Smokeless Tobacco Never Used    Goals Met:  Independence with exercise equipment Exercise tolerated well No report of cardiac concerns or symptoms Strength training completed today  Goals Unmet:  Not Applicable  Comments: Pt able to follow exercise prescription today without complaint.  Will continue to monitor for progression.    Dr. Emily Filbert is Medical Director for [place] and [facility].

## 2019-09-09 ENCOUNTER — Encounter: Payer: Self-pay | Admitting: *Deleted

## 2019-09-09 DIAGNOSIS — I214 Non-ST elevation (NSTEMI) myocardial infarction: Secondary | ICD-10-CM

## 2019-09-09 NOTE — Progress Notes (Signed)
Cardiac Individual Treatment Plan  Patient Details  Name: Lillianah Swartzentruber MRN: [id number] Date of Birth: 30-May-1950 Referring Provider:     Cardiac Rehab from 07/30/2019 in [facility] Cardiac and Pulmonary Rehab  Referring Provider Neoma Laming MD      Initial Encounter Date:    Cardiac Rehab from 07/30/2019 in [facility] Cardiac and Pulmonary Rehab  Date 07/30/19      Visit Diagnosis: NSTEMI (non-ST elevated myocardial infarction) [facility])  Patient's Home Medications on Admission:  Current Outpatient Medications:  .  ascorbic acid (VITAMIN C) 100 MG tablet, Take 100 mg by mouth daily., Disp: , Rfl:  .  aspirin 81 MG chewable tablet, Chew 1 tablet (81 mg total) by mouth daily., Disp: 30 tablet, Rfl: 0 .  atorvastatin (LIPITOR) 80 MG tablet, Take 1 tablet (80 mg total) by mouth daily., Disp: 90 tablet, Rfl: 0 .  Biotin 10 MG TABS, Take 10 mg by mouth daily., Disp: , Rfl:  .  chlorhexidine (PERIDEX) 0.12 % solution, 30 mLs by Mouth Rinse route See admin instructions. Rinse as needed for mouth sores., Disp: , Rfl:  .  Coenzyme Q10 (CO Q 10 PO), Take 1 capsule by mouth daily., Disp: , Rfl:  .  docusate sodium (COLACE) 100 MG capsule, Take 1 capsule (100 mg total) by mouth daily as needed., Disp: 30 capsule, Rfl: 2 .  insulin detemir (LEVEMIR) 100 UNIT/ML injection, Inject 18 Units into the skin See admin instructions. For use when not using the insulin pump., Disp: , Rfl:  .  Insulin Human (INSULIN PUMP) SOLN, Inject into the skin., Disp: , Rfl:  .  insulin lispro (HUMALOG) 100 UNIT/ML injection, Inject into the skin 3 (three) times daily before meals., Disp: , Rfl:  .  latanoprost (XALATAN) 0.005 % ophthalmic solution, 1 drop at bedtime., Disp: , Rfl:  .  Multiple Vitamin (MULTIVITAMIN) capsule, Take 1 capsule by mouth daily., Disp: , Rfl:  .  nitroGLYCERIN (NITROSTAT) 0.4 MG SL tablet, Place 1 tablet (0.4 mg total) under the tongue every 5 (five) minutes as needed for chest pain., Disp: 9 tablet, Rfl:  12 .  oxyCODONE-acetaminophen (PERCOCET) 5-325 MG tablet, Take 1 tablet by mouth every 8 (eight) hours as needed. (Patient not taking: Reported on 07/29/2019), Disp: 20 tablet, Rfl: 0 .  thyroid (ARMOUR) 65 MG tablet, Take 65 mg by mouth daily., Disp: , Rfl:  .  ticagrelor (BRILINTA) 90 MG TABS tablet, Take 1 tablet (90 mg total) by mouth 2 (two) times daily., Disp: 60 tablet, Rfl: 3 .  Travoprost, BAK Free, (TRAVATAN) 0.004 % SOLN ophthalmic solution, Place 1 drop into both eyes at bedtime., Disp: , Rfl:  .  traZODone (DESYREL) 50 MG tablet, Take 25 mg by mouth at bedtime as needed., Disp: , Rfl:  .  triamcinolone (NASACORT) 55 MCG/ACT AERO nasal inhaler, Place 1 spray into the nose daily as needed., Disp: , Rfl:  .  valsartan (DIOVAN) 40 MG tablet, Take 20 mg by mouth daily. , Disp: , Rfl: 11 .  zinc sulfate (ZINC-220) 220 (50 Zn) MG capsule, Take 220 mg by mouth daily., Disp: , Rfl:   Past Medical History: Past Medical History:  Diagnosis Date  . Hypertension   . Hypothyroid   . Type 1 diabetes (HCC)     Tobacco Use: Social History   Tobacco Use  Smoking Status Never Smoker  Smokeless Tobacco Never Used    Labs: Recent Review Scientist, physiological    Labs for ITP Cardiac and Pulmonary  Rehab Latest Ref Rng & Units 07/16/2019   Cholestrol 0 - 200 mg/dL 135   LDLCALC 0 - 99 mg/dL 79   HDL >40 mg/dL 45   Trlycerides <150 mg/dL 55   Hemoglobin A1c 4.8 - 5.6 % 6.9(H)       Exercise Target Goals: Exercise Program Goal: Individual exercise prescription set using results from initial 6 min walk test and THRR while considering  patient's activity barriers and safety.   Exercise Prescription Goal: Initial exercise prescription builds to 30-45 minutes a day of aerobic activity, 2-3 days per week.  Home exercise guidelines will be given to patient during program as part of exercise prescription that the participant will acknowledge.   Education: Aerobic Exercise & Resistance Training: -  Gives group verbal and written instruction on the various components of exercise. Focuses on aerobic and resistive training programs and the benefits of this training and how to safely progress through these programs..   Education: Exercise & Equipment Safety: - Individual verbal instruction and demonstration of equipment use and safety with use of the equipment.   Cardiac Rehab from 07/29/2019 in [facility] Cardiac and Pulmonary Rehab  Date 07/29/19  Educator [facility]  Instruction Review Code 1- Verbalizes Understanding      Education: Exercise Physiology & General Exercise Guidelines: - Group verbal and written instruction with models to review the exercise physiology of the cardiovascular system and associated critical values. Provides general exercise guidelines with specific guidelines to those with heart or lung disease.    Education: Flexibility, Balance, Mind/Body Relaxation: Provides group verbal/written instruction on the benefits of flexibility and balance training, including mind/body exercise modes such as yoga, pilates and tai chi.  Demonstration and skill practice provided.   Activity Barriers & Risk Stratification:  Activity Barriers & Cardiac Risk Stratification - 07/30/19 1308      Activity Barriers & Cardiac Risk Stratification   Activity Barriers Shortness of Breath;Deconditioning;Muscular Weakness;Other (comment);Balance Concerns    Comments previous vertigo, meds sometimes made it difficult to walk    Cardiac Risk Stratification Moderate           6 Minute Walk:  6 Minute Walk    Row Name 07/30/19 1305         6 Minute Walk   Phase Initial     Distance 1265 feet     Walk Time 6 minutes     # of Rest Breaks 0     MPH 2.4     METS 2.75     RPE 11     VO2 Peak 9.64     Symptoms Yes (comment)     Comments fatigue, r hip pain/dull ache 2-3/10     Resting HR 63 bpm     Resting BP 138/72     Resting Oxygen Saturation  99 %     Exercise Oxygen Saturation  during 6  min walk 96 %     Max Ex. HR 87 bpm     Max Ex. BP 144/74     2 Minute Post BP 128/70            Oxygen Initial Assessment:   Oxygen Re-Evaluation:   Oxygen Discharge (Final Oxygen Re-Evaluation):   Initial Exercise Prescription:  Initial Exercise Prescription - 07/30/19 1300      Date of Initial Exercise RX and Referring Provider   Date 07/30/19    Referring Provider Neoma Laming MD      Treadmill   MPH 2.3  Grade 0    Minutes 15    METs 2.76      Elliptical   Level 1    Speed 2.3    Minutes 15    METs 2      REL-XR   Level 1    Speed 50    Minutes 15    METs 2      Prescription Details   Frequency (times per week) 3    Duration Progress to 30 minutes of continuous aerobic without signs/symptoms of physical distress      Intensity   THRR 40-80% of Max Heartrate 99-134    Ratings of Perceived Exertion 11-13    Perceived Dyspnea 0-4      Progression   Progression Continue to progress workloads to maintain intensity without signs/symptoms of physical distress.      Resistance Training   Training Prescription Yes    Weight 3 lb    Reps 10-15           Perform Capillary Blood Glucose checks as needed.  Exercise Prescription Changes:  Exercise Prescription Changes    Row Name 07/30/19 1300 08/18/19 1600 08/31/19 1600 09/04/19 1100       Response to Exercise   Blood Pressure (Admit) 138/72 122/82 122/56 --    Blood Pressure (Exercise) 144/74 162/58 150/70 --    Blood Pressure (Exit) 128/70 118/58 112/60 --    Heart Rate (Admit) 63 bpm 72 bpm 73 bpm --    Heart Rate (Exercise) 87 bpm 100 bpm 106 bpm --    Heart Rate (Exit) 63 bpm 83 bpm 82 bpm --    Oxygen Saturation (Admit) 99 % -- -- --    Oxygen Saturation (Exercise) 96 % -- -- --    Rating of Perceived Exertion (Exercise) _0 --    Symptoms fatigue, r hip pain dull ache 2-3/10 none none --    Comments walk test results -- -- --    Duration -- Continue with 30 min of aerobic  exercise without signs/symptoms of physical distress. Continue with 30 min of aerobic exercise without signs/symptoms of physical distress. --    Intensity -- THRR unchanged THRR unchanged --      Progression   Progression -- Continue to progress workloads to maintain intensity without signs/symptoms of physical distress. Continue to progress workloads to maintain intensity without signs/symptoms of physical distress. --    Average METs -- 3.5 3.51 --      Resistance Training   Training Prescription -- Yes Yes --    Weight -- 3 lb 3 lb --    Reps -- 10-15 10-15 --      Interval Training   Interval Training -- -- No --      Treadmill   MPH -- 2.3 2.9 --    Grade -- 0.5 0.5 --    Minutes -- 15 15 --    METs -- 2.92 3.42 --      REL-XR   Level -- -- 1 --    Minutes -- -- 15 --    METs -- -- 5 --      T5 Nustep   Level -- 3 2 --    SPM -- 80 -- --    Minutes -- 15 15 --    METs -- 2 2.1 --      Home Exercise Plan   Plans to continue exercise at -- -- -- Home (comment)  walking and staff videos  Frequency -- -- -- Add 2 additional days to program exercise sessions.    Initial Home Exercises Provided -- -- -- 09/04/19           Exercise Comments:   Exercise Goals and Review:  Exercise Goals    Row Name 07/30/19 1312             Exercise Goals   Increase Physical Activity Yes       Intervention Provide advice, education, support and counseling about physical activity/exercise needs.;Develop an individualized exercise prescription for aerobic and resistive training based on initial evaluation findings, risk stratification, comorbidities and participant's personal goals.       Expected Outcomes Short Term: Attend rehab on a regular basis to increase amount of physical activity.;Long Term: Add in home exercise to make exercise part of routine and to increase amount of physical activity.;Long Term: Exercising regularly at least 3-5 days a week.       Increase Strength  and Stamina Yes       Intervention Provide advice, education, support and counseling about physical activity/exercise needs.;Develop an individualized exercise prescription for aerobic and resistive training based on initial evaluation findings, risk stratification, comorbidities and participant's personal goals.       Expected Outcomes Short Term: Increase workloads from initial exercise prescription for resistance, speed, and METs.;Short Term: Perform resistance training exercises routinely during rehab and add in resistance training at home;Long Term: Improve cardiorespiratory fitness, muscular endurance and strength as measured by increased METs and functional capacity (6MWT)       Able to understand and use rate of perceived exertion (RPE) scale Yes       Intervention Provide education and explanation on how to use RPE scale       Expected Outcomes Short Term: Able to use RPE daily in rehab to express subjective intensity level;Long Term:  Able to use RPE to guide intensity level when exercising independently       Able to understand and use Dyspnea scale Yes       Intervention Provide education and explanation on how to use Dyspnea scale       Expected Outcomes Short Term: Able to use Dyspnea scale daily in rehab to express subjective sense of shortness of breath during exertion;Long Term: Able to use Dyspnea scale to guide intensity level when exercising independently       Knowledge and understanding of Target Heart Rate Range (THRR) Yes       Intervention Provide education and explanation of THRR including how the numbers were predicted and where they are located for reference       Expected Outcomes Short Term: Able to state/look up THRR;Long Term: Able to use THRR to govern intensity when exercising independently;Short Term: Able to use daily as guideline for intensity in rehab       Able to check pulse independently Yes       Intervention Provide education and demonstration on how to check  pulse in carotid and radial arteries.;Review the importance of being able to check your own pulse for safety during independent exercise       Expected Outcomes Short Term: Able to explain why pulse checking is important during independent exercise;Long Term: Able to check pulse independently and accurately       Understanding of Exercise Prescription Yes       Intervention Provide education, explanation, and written materials on patient's individual exercise prescription       Expected Outcomes Short Term: Able  to explain program exercise prescription;Long Term: Able to explain home exercise prescription to exercise independently              Exercise Goals Re-Evaluation :  Exercise Goals Re-Evaluation    Row Name 08/05/19 1023 08/18/19 1607 08/31/19 1603 09/04/19 1040       Exercise Goal Re-Evaluation   Exercise Goals Review Increase Physical Activity;Able to understand and use rate of perceived exertion (RPE) scale;Knowledge and understanding of Target Heart Rate Range (THRR);Increase Strength and Stamina;Able to check pulse independently Increase Physical Activity;Increase Strength and Stamina;Able to understand and use rate of perceived exertion (RPE) scale;Able to understand and use Dyspnea scale;Knowledge and understanding of Target Heart Rate Range (THRR);Able to check pulse independently;Understanding of Exercise Prescription Increase Physical Activity;Increase Strength and Stamina;Understanding of Exercise Prescription Increase Physical Activity;Increase Strength and Stamina;Understanding of Exercise Prescription    Comments Reviewed RPE and dyspnea scales, THR and program prescription with pt today.  Pt voiced understanding and was given a copy of goals to take home. [place] is doing well in rehab.  She has increased incline on TM.  Staff willmonitor progress. Lameka continues to do well in rehab.  She is now up to 5 METs on the XR. We will continue to montior her progress. Unita is doing well  in rehab.  She is getting in some already for 25 min of walking.  We talked about increasing to 30 min.  Reviewed home exercise with pt today.  Pt plans to continue walking at home for exercise.  She is to increase her time to 30 min.  Reviewed THR, pulse, RPE, sign and symptoms, pulse oximetery and when to call 911 or MD.  Also discussed weather considerations and indoor options.  Pt voiced understanding.    Expected Outcomes Short: Use RPE daily to regulate intensity. Long: Follow program prescription in THR. Short:  attend consistently Long: increase overall stamina Short: Increase workloads Long: Continue to improve stamina. Short: Increase exercise time at home to 30 min.  Long; Continue to improve stamina.           Discharge Exercise Prescription (Final Exercise Prescription Changes):  Exercise Prescription Changes - 09/04/19 1100      Home Exercise Plan   Plans to continue exercise at Home (comment)   walking and staff videos   Frequency Add 2 additional days to program exercise sessions.    Initial Home Exercises Provided 09/04/19           Nutrition:  Target Goals: Understanding of nutrition guidelines, daily intake of sodium '1500mg'$ , cholesterol '200mg'$ , calories 30% from fat and 7% or less from saturated fats, daily to have 5 or more servings of fruits and vegetables.  Education: Controlling Sodium/Reading Food Labels -Group verbal and written material supporting the discussion of sodium use in heart healthy nutrition. Review and explanation with models, verbal and written materials for utilization of the food label.   Education: General Nutrition Guidelines/Fats and Fiber: -Group instruction provided by verbal, written material, models and posters to present the general guidelines for heart healthy nutrition. Gives an explanation and review of dietary fats and fiber.   Biometrics:  Pre Biometrics - 07/30/19 1313      Pre Biometrics   Height 5' 0.75" (1.543 m)    Weight  146 lb 14.4 oz (66.6 kg)    BMI (Calculated) 27.99    Single Leg Stand 13.6 seconds            Nutrition Therapy Plan and Nutrition  Goals:  Nutrition Therapy & Goals - 08/17/19 1024      Nutrition Therapy   Diet Low Na, heart healthy, diabetes friendly    Protein (specify units) 55-60g    Fiber 25 grams    Whole Grain Foods 3 servings    Saturated Fats 12 max. grams    Fruits and Vegetables 5 servings/day    Sodium 1.5 grams      Personal Nutrition Goals   Nutrition Goal ST: add in fats to meals, add cannellini beans to avocado toast to add protein, indentify high Na sources in diet LT: Wants to get back to gardening and other activities she enjoys    Comments T1DM 47 Years in July. A1c 6.9. B: coffee (stevia)(oatmilk, almond), cereal (oatmeal and cheerios - oatmilk) L: Salad  and chicken D: salad (kits - vinegarette) and chicken or fish or couscous or bulgur wheat, beans. Hunger levels normal throughout the day due to lower appetite. Pt uses olive oil. Discussed heart healthy eating, balance, variety, and diabetes.      Intervention Plan   Intervention Prescribe, educate and counsel regarding individualized specific dietary modifications aiming towards targeted core components such as weight, hypertension, lipid management, diabetes, heart failure and other comorbidities.;Nutrition handout(s) given to patient.    Expected Outcomes Short Term Goal: Understand basic principles of dietary content, such as calories, fat, sodium, cholesterol and nutrients.;Short Term Goal: A plan has been developed with personal nutrition goals set during dietitian appointment.;Long Term Goal: Adherence to prescribed nutrition plan.   Low sodium handout and flavor handout in addition to general guidelines.          Nutrition Assessments:  Nutrition Assessments - 07/30/19 1314      MEDFICTS Scores   Pre Score 13           MEDIFICTS Score Key:          ?70 Need to make dietary changes           40-70 Heart Healthy Diet         ? 40 Therapeutic Level Cholesterol Diet  Nutrition Goals Re-Evaluation:   Nutrition Goals Discharge (Final Nutrition Goals Re-Evaluation):   Psychosocial: Target Goals: Acknowledge presence or absence of significant depression and/or stress, maximize coping skills, provide positive support system. Participant is able to verbalize types and ability to use techniques and skills needed for reducing stress and depression.   Education: Depression - Provides group verbal and written instruction on the correlation between heart/lung disease and depressed mood, treatment options, and the stigmas associated with seeking treatment.   Education: Sleep Hygiene -Provides group verbal and written instruction about how sleep can affect your health.  Define sleep hygiene, discuss sleep cycles and impact of sleep habits. Review good sleep hygiene tips.     Education: Stress and Anxiety: - Provides group verbal and written instruction about the health risks of elevated stress and causes of high stress.  Discuss the correlation between heart/lung disease and anxiety and treatment options. Review healthy ways to manage with stress and anxiety.    Initial Review & Psychosocial Screening:  Initial Psych Review & Screening - 07/29/19 1336      Initial Review   Current issues with None Identified      Family Dynamics   Good Support System? Yes    Comments She can look  to her husband, son and borther inlaw for support. She has a positive outlook on her health.      Barriers  Psychosocial barriers to participate in program There are no identifiable barriers or psychosocial needs.      Screening Interventions   Interventions Encouraged to exercise;Provide feedback about the scores to participant;To provide support and resources with identified psychosocial needs    Expected Outcomes Short Term goal: Utilizing psychosocial counselor, staff and physician to assist with  identification of specific Stressors or current issues interfering with healing process. Setting desired goal for each stressor or current issue identified.;Short Term goal: Identification and review with participant of any Quality of Life or Depression concerns found by scoring the questionnaire.;Long Term Goal: Stressors or current issues are controlled or eliminated.;Long Term goal: The participant improves quality of Life and PHQ9 Scores as seen by post scores and/or verbalization of changes           Quality of Life Scores:   Quality of Life - 07/30/19 1313      Quality of Life   Select Quality of Life      Quality of Life Scores   Health/Function Pre 24.46 %    Socioeconomic Pre 27 %    Psych/Spiritual Pre 26.57 %    Family Pre 26.4 %    GLOBAL Pre 25.74 %          Scores of 19 and below usually indicate a poorer quality of life in these areas.  A difference of  2-3 points is a clinically meaningful difference.  A difference of 2-3 points in the total score of the Quality of Life Index has been associated with significant improvement in overall quality of life, self-image, physical symptoms, and general health in studies assessing change in quality of life.  PHQ-9: Recent Review Flowsheet Data    Depression screen [facility] 2/9 08/14/2019 07/30/2019   Decreased Interest 0 0   Down, Depressed, Hopeless 0 0   PHQ - 2 Score 0 0   Altered sleeping 0 1   Tired, decreased energy 0 3   Change in appetite 0 0   Feeling bad or failure about yourself  0 0   Trouble concentrating 0 1   Moving slowly or fidgety/restless 0 0   Suicidal thoughts 0 0   PHQ-9 Score 0 5   Difficult doing work/chores - Somewhat difficult     Interpretation of Total Score  Total Score Depression Severity:  1-4 = Minimal depression, 5-9 = Mild depression, 10-14 = Moderate depression, 15-19 = Moderately severe depression, 20-27 = Severe depression   Psychosocial Evaluation and Intervention:  Psychosocial  Evaluation - 07/29/19 1401      Psychosocial Evaluation & Interventions   Interventions Encouraged to exercise with the program and follow exercise prescription    Comments Patient has a positive outlook on her health and states no depression issues.    Expected Outcomes Short: Attend [facility] stress management education to decrease stress. Long: Maintain exercise Post [facility] to keep stress at a minimum.    Continue Psychosocial Services  Follow up required by staff           Psychosocial Re-Evaluation:  Psychosocial Re-Evaluation    [place] Name 09/04/19 1043             Psychosocial Re-Evaluation   Current issues with Current Stress Concerns;Current Sleep Concerns       Comments Yolander is doing well, she does admit to having some down days, but overall is good.  She denies major stressors in life.  Her biggest concern is waking up at 4am daily.  She will  start to play games or read to help decompress for a little bit.  Overall, she is able to get about 6 hours of sleep.  We talked about reviewing the sleep videos online.  They are also getting their house ready to move.       Expected Outcomes Short: Watch sleep videos Long: Continue to stay positive.       Interventions Stress management education;Encouraged to attend Cardiac Rehabilitation for the exercise       Continue Psychosocial Services  Follow up required by staff              Psychosocial Discharge (Final Psychosocial Re-Evaluation):  Psychosocial Re-Evaluation - 09/04/19 1043      Psychosocial Re-Evaluation   Current issues with Current Stress Concerns;Current Sleep Concerns    Comments Tashera is doing well, she does admit to having some down days, but overall is good.  She denies major stressors in life.  Her biggest concern is waking up at 4am daily.  She will start to play games or read to help decompress for a little bit.  Overall, she is able to get about 6 hours of sleep.  We talked about reviewing the sleep  videos online.  They are also getting their house ready to move.    Expected Outcomes Short: Watch sleep videos Long: Continue to stay positive.    Interventions Stress management education;Encouraged to attend Cardiac Rehabilitation for the exercise    Continue Psychosocial Services  Follow up required by staff           Vocational Rehabilitation: Provide vocational rehab assistance to qualifying candidates.   Vocational Rehab Evaluation & Intervention:   Education: Education Goals: Education classes will be provided on a variety of topics geared toward better understanding of heart health and risk factor modification. Participant will state understanding/return demonstration of topics presented as noted by education test scores.  Learning Barriers/Preferences:  Learning Barriers/Preferences - 07/29/19 1338      Learning Barriers/Preferences   Learning Barriers None    Learning Preferences None           General Cardiac Education Topics:  AED/CPR: - Group verbal and written instruction with the use of models to demonstrate the basic use of the AED with the basic ABC's of resuscitation.   Anatomy & Physiology of the Heart: - Group verbal and written instruction and models provide basic cardiac anatomy and physiology, with the coronary electrical and arterial systems. Review of Valvular disease and Heart Failure   Cardiac Procedures: - Group verbal and written instruction to review commonly prescribed medications for heart disease. Reviews the medication, class of the drug, and side effects. Includes the steps to properly store meds and maintain the prescription regimen. (beta blockers and nitrates)   Cardiac Medications I: - Group verbal and written instruction to review commonly prescribed medications for heart disease. Reviews the medication, class of the drug, and side effects. Includes the steps to properly store meds and maintain the prescription regimen.   Cardiac  Medications II: -Group verbal and written instruction to review commonly prescribed medications for heart disease. Reviews the medication, class of the drug, and side effects. (all other drug classes)    Go Sex-Intimacy & Heart Disease, Get SMART - Goal Setting: - Group verbal and written instruction through game format to discuss heart disease and the return to sexual intimacy. Provides group verbal and written material to discuss and apply goal setting through the application of the S.M.A.R.T. Method.  Other Matters of the Heart: - Provides group verbal, written materials and models to describe Stable Angina and Peripheral Artery. Includes description of the disease process and treatment options available to the cardiac patient.   Infection Prevention: - Provides verbal and written material to individual with discussion of infection control including proper hand washing and proper equipment cleaning during exercise session.   Cardiac Rehab from 07/29/2019 in [facility] Cardiac and Pulmonary Rehab  Date 07/29/19  Educator [facility]  Instruction Review Code 1- Verbalizes Understanding      Falls Prevention: - Provides verbal and written material to individual with discussion of falls prevention and safety.   Cardiac Rehab from 07/29/2019 in [facility] Cardiac and Pulmonary Rehab  Date 07/29/19  Educator [facility]  Instruction Review Code 1- Verbalizes Understanding      Other: -Provides group and verbal instruction on various topics (see comments)   Knowledge Questionnaire Score:  Knowledge Questionnaire Score - 07/30/19 1314      Knowledge Questionnaire Score   Pre Score 23/26 Education Focus: exercise           Core Components/Risk Factors/Patient Goals at Admission:  Personal Goals and Risk Factors at Admission - 07/30/19 1314      Core Components/Risk Factors/Patient Goals on Admission    Weight Management Yes;Weight Loss    Intervention Weight Management: Develop a combined nutrition and  exercise program designed to reach desired caloric intake, while maintaining appropriate intake of nutrient and fiber, sodium and fats, and appropriate energy expenditure required for the weight goal.;Weight Management: Provide education and appropriate resources to help participant work on and attain dietary goals.    Admit Weight 146 lb 14.4 oz (66.6 kg)    Goal Weight: Short Term 141 lb (64 kg)    Goal Weight: Long Term 135 lb (61.2 kg)    Expected Outcomes Short Term: Continue to assess and modify interventions until short term weight is achieved;Weight Loss: Understanding of general recommendations for a balanced deficit meal plan, which promotes 1-2 lb weight loss per week and includes a negative energy balance of [phone] kcal/d;Understanding recommendations for meals to include 15-35% energy as protein, 25-35% energy from fat, 35-60% energy from carbohydrates, less than 229m of dietary cholesterol, 20-35 gm of total fiber daily;Understanding of distribution of calorie intake throughout the day with the consumption of 4-5 meals/snacks    Diabetes Yes    Intervention Provide education about signs/symptoms and action to take for hypo/hyperglycemia.;Provide education about proper nutrition, including hydration, and aerobic/resistive exercise prescription along with prescribed medications to achieve blood glucose in normal ranges: Fasting glucose 65-99 mg/dL    Expected Outcomes Short Term: Participant verbalizes understanding of the signs/symptoms and immediate care of hyper/hypoglycemia, proper foot care and importance of medication, aerobic/resistive exercise and nutrition plan for blood glucose control.;Long Term: Attainment of HbA1C < 7%.    Hypertension Yes    Intervention Provide education on lifestyle modifcations including regular physical activity/exercise, weight management, moderate sodium restriction and increased consumption of fresh fruit, vegetables, and low fat dairy, alcohol  moderation, and smoking cessation.;Monitor prescription use compliance.    Expected Outcomes Short Term: Continued assessment and intervention until BP is < 140/950mHG in hypertensive participants. < 130/8061mG in hypertensive participants with diabetes, heart failure or chronic kidney disease.;Long Term: Maintenance of blood pressure at goal levels.    Lipids Yes    Intervention Provide education and support for participant on nutrition & aerobic/resistive exercise along with prescribed medications to achieve LDL <58m57mDL >40mg91m  Expected Outcomes Short Term: Participant states understanding of desired cholesterol values and is compliant with medications prescribed. Participant is following exercise prescription and nutrition guidelines.;Long Term: Cholesterol controlled with medications as prescribed, with individualized exercise RX and with personalized nutrition plan. Value goals: LDL < 23m, HDL > 40 mg.           Education:Diabetes - Individual verbal and written instruction to review signs/symptoms of diabetes, desired ranges of glucose level fasting, after meals and with exercise. Acknowledge that pre and post exercise glucose checks will be done for 3 sessions at entry of program.   [facility]  Date [facility]/29/19  [facility] Review Code 1- Verbalizes Understanding      Education: Know Your Numbers and Risk Factors: -Group verbal and written instruction about important numbers in your health.  Discussion of what are risk factors and how they play a role in the disease process.  Review of Cholesterol, Blood Pressure, Diabetes, and BMI and the role they play in your overall health.   Core Components/Risk Factors/Patient Goals Review:   Goals and Risk Factor Review    Row Name 09/04/19 1046             Core Components/Risk Factors/Patient Goals Review   Personal Goals Review Weight  Management/Obesity;Hypertension;Diabetes;Lipids       Review AZamiyahis doing well in rehab, she has already lost a couple of pounds.  Her blood sugars have been erratic and stress will trigger a reaction and we talked about how exercise can help.  Blood pressures have been good overall in class. She does not check it at home as things are scattered getting ready to move.       Expected Outcomes Short: Get sugars under better control. Long Continue to monitor risk factors.              Core Components/Risk Factors/Patient Goals at Discharge (Final Review):   Goals and Risk Factor Review - 09/04/19 1046      Core Components/Risk Factors/Patient Goals Review   Personal Goals Review Weight Management/Obesity;Hypertension;Diabetes;Lipids    Review ASheronis doing well in rehab, she has already lost a couple of pounds.  Her blood sugars have been erratic and stress will trigger a reaction and we talked about how exercise can help.  Blood pressures have been good overall in class. She does not check it at home as things are scattered getting ready to move.    Expected Outcomes Short: Get sugars under better control. Long Continue to monitor risk factors.           ITP Comments:  ITP Comments    Row Name 07/29/19 1344 07/30/19 1304 08/05/19 1022 08/12/19 0744 09/09/19 0645   ITP Comments Virtual Visit Completed. Patient informed of EP and RD schedule. [facility] Completed 6MWT and gym orientation.  Initial ITP created and sent for review to Dr. MEmily Filbert Medical Director. First full day of exercise!  Patient was oriented to gym and equipment including functions, settings, policies, and procedures.  Patient's individual exercise prescription and treatment plan were reviewed.  All starting workloads were established based on the results of the 6 minute walk test done at initial orientation visit.  The plan for exercise progression was also introduced and progression  will be customized based on patient's performance and goals 30 Day review completed. ITP review done, changes made as directed,and approval shown by  signature of Scientist, research (life sciences).  New to program 30 Day review completed. Medical Director ITP review done, changes made as directed, and signed approval by Medical Director.          Comments: 30 Day review completed. Medical Director ITP review done, changes made as directed, and signed approval by Medical Director.

## 2019-09-11 ENCOUNTER — Other Ambulatory Visit: Payer: Self-pay

## 2019-09-11 ENCOUNTER — Encounter: Payer: 59 | Admitting: *Deleted

## 2019-09-11 DIAGNOSIS — I214 Non-ST elevation (NSTEMI) myocardial infarction: Secondary | ICD-10-CM

## 2019-09-11 DIAGNOSIS — I252 Old myocardial infarction: Secondary | ICD-10-CM | POA: Diagnosis not present

## 2019-09-11 NOTE — Progress Notes (Signed)
Daily Session Note  Patient Details  Name: Maridel Pixler MRN: [id number] Date of Birth: 1950-04-13 Referring Provider:     Cardiac Rehab from 07/30/2019 in [facility] Cardiac and Pulmonary Rehab  Referring Provider Neoma Laming MD      Encounter Date: 09/11/2019  Check In:  Session Check In - 09/11/19 1002      Check-In   Supervising physician immediately available to respond to emergencies See telemetry face sheet for immediately available ER MD    Location [facility]-Cardiac & Pulmonary Rehab    Staff Present Renita Papa, RN BSN;Joseph [address] [place], [place], RCEP, CCRP, CCET    Virtual Visit No    Medication changes reported     No    Fall or balance concerns reported    No    Warm-up and Cool-down Performed on first and last piece of equipment    Resistance Training Performed Yes    VAD Patient? No    PAD/SET Patient? No      Pain Assessment   Currently in Pain? No/denies              Social History   Tobacco Use  Smoking Status Never Smoker  Smokeless Tobacco Never Used    Goals Met:  Independence with exercise equipment Exercise tolerated well No report of cardiac concerns or symptoms Strength training completed today  Goals Unmet:  Not Applicable  Comments: Pt able to follow exercise prescription today without complaint.  Will continue to monitor for progression.    Dr. Emily Filbert is Medical Director for [place] and [facility].

## 2019-09-14 ENCOUNTER — Other Ambulatory Visit: Payer: Self-pay

## 2019-09-14 ENCOUNTER — Encounter: Payer: 59 | Admitting: *Deleted

## 2019-09-14 DIAGNOSIS — I252 Old myocardial infarction: Secondary | ICD-10-CM | POA: Diagnosis not present

## 2019-09-14 DIAGNOSIS — I214 Non-ST elevation (NSTEMI) myocardial infarction: Secondary | ICD-10-CM

## 2019-09-14 NOTE — Progress Notes (Signed)
Daily Session Note  Patient Details  Name: Cheyenne Lopez MRN: [id number] Date of Birth: Oct 24, 1950 Referring Provider:     Cardiac Rehab from 07/30/2019 in [facility] Cardiac and Pulmonary Rehab  Referring Provider Neoma Laming MD      Encounter Date: 09/14/2019  Check In:  Session Check In - 09/14/19 1021      Check-In   Supervising physician immediately available to respond to emergencies See telemetry face sheet for immediately available ER MD    Location [facility]-Cardiac & Pulmonary Rehab    Staff Present Renita Papa, RN BSN;Joseph [address] [place], [place], [place], CCRP, [place], [place], ACSM CEP, Exercise Physiologist    Virtual Visit No    Medication changes reported     No    Fall or balance concerns reported    No    Warm-up and Cool-down Performed on first and last piece of equipment    Resistance Training Performed Yes    VAD Patient? No    PAD/SET Patient? No      Pain Assessment   Currently in Pain? No/denies              Social History   Tobacco Use  Smoking Status Never Smoker  Smokeless Tobacco Never Used    Goals Met:  Independence with exercise equipment Exercise tolerated well No report of cardiac concerns or symptoms Strength training completed today  Goals Unmet:  Not Applicable  Comments: Pt able to follow exercise prescription today without complaint.  Will continue to monitor for progression.    Dr. Emily Filbert is Medical Director for [place] and [facility].

## 2019-09-16 ENCOUNTER — Encounter: Payer: 59 | Admitting: *Deleted

## 2019-09-16 ENCOUNTER — Other Ambulatory Visit: Payer: Self-pay

## 2019-09-16 DIAGNOSIS — I214 Non-ST elevation (NSTEMI) myocardial infarction: Secondary | ICD-10-CM

## 2019-09-16 DIAGNOSIS — I252 Old myocardial infarction: Secondary | ICD-10-CM | POA: Diagnosis not present

## 2019-09-16 NOTE — Progress Notes (Signed)
Daily Session Note  Patient Details  Name: Cheyenne Lopez MRN: [id number] Date of Birth: November 22, 1950 Referring Provider:     Cardiac Rehab from 07/30/2019 in [facility] Cardiac and Pulmonary Rehab  Referring Provider Neoma Laming MD      Encounter Date: 09/16/2019  Check In:  Session Check In - 09/16/19 1032      Check-In   Supervising physician immediately available to respond to emergencies See telemetry face sheet for immediately available ER MD    Location [facility]-Cardiac & Pulmonary Rehab    Staff Present Renita Papa, RN BSN;Melissa Caiola RDN, Rowe Pavy, BA, ACSM CEP, Exercise Physiologist    Virtual Visit No    Medication changes reported     No    Fall or balance concerns reported    No    Warm-up and Cool-down Performed on first and last piece of equipment    Resistance Training Performed Yes    VAD Patient? No    PAD/SET Patient? No      Pain Assessment   Currently in Pain? No/denies              Social History   Tobacco Use  Smoking Status Never Smoker  Smokeless Tobacco Never Used    Goals Met:  Independence with exercise equipment Exercise tolerated well No report of cardiac concerns or symptoms Strength training completed today  Goals Unmet:  Not Applicable  Comments: Pt able to follow exercise prescription today without complaint.  Will continue to monitor for progression.    Dr. Emily Filbert is Medical Director for [place] and [facility].

## 2019-09-18 ENCOUNTER — Encounter: Payer: 59 | Admitting: *Deleted

## 2019-09-18 ENCOUNTER — Other Ambulatory Visit: Payer: Self-pay

## 2019-09-18 DIAGNOSIS — I214 Non-ST elevation (NSTEMI) myocardial infarction: Secondary | ICD-10-CM

## 2019-09-18 DIAGNOSIS — I252 Old myocardial infarction: Secondary | ICD-10-CM | POA: Diagnosis not present

## 2019-09-18 NOTE — Progress Notes (Signed)
Daily Session Note  Patient Details  Name: Nico Rogness MRN: [id number] Date of Birth: November 27, 1950 Referring Provider:     Cardiac Rehab from 07/30/2019 in [facility] Cardiac and Pulmonary Rehab  Referring Provider Neoma Laming MD      Encounter Date: 09/18/2019  Check In:  Session Check In - 09/18/19 1018      Check-In   Staff Present Nyoka Cowden, RN, BSN, MA;Meredith Sherryll Burger, RN BSN;Joseph Hood RCP,RRT,BSRT    Virtual Visit No    Fall or balance concerns reported    No    Tobacco Cessation No Change    Warm-up and Cool-down Performed on first and last piece of equipment    Resistance Training Performed Yes    VAD Patient? No    PAD/SET Patient? No              Social History   Tobacco Use  Smoking Status Never Smoker  Smokeless Tobacco Never Used    Goals Met:  Independence with exercise equipment Exercise tolerated well No report of cardiac concerns or symptoms Strength training completed today  Goals Unmet:  Not Applicable  Comments: Pt able to follow exercise prescription today without complaint.  Will continue to monitor for progression.   Dr. Emily Filbert is Medical Director for [place] and [facility].

## 2019-09-18 NOTE — Progress Notes (Signed)
Daily Session Note  Patient Details  Name: Cheyenne Lopez MRN: [id number] Date of Birth: 08-30-1950 Referring Provider:     Cardiac Rehab from 07/30/2019 in [facility] Cardiac and Pulmonary Rehab  Referring Provider Neoma Laming MD      Encounter Date: 09/18/2019  Check In:  Session Check In - 09/18/19 1048      Check-In   Supervising physician immediately available to respond to emergencies See telemetry face sheet for immediately available ER MD    Location [facility]-Cardiac & Pulmonary Rehab    Staff Present Nyoka Cowden, RN, BSN, MA;Meredith Sherryll Burger, RN BSN;Joseph Hood RCP,RRT,BSRT    Virtual Visit No    Medication changes reported     No    Fall or balance concerns reported    No    Tobacco Cessation No Change    Warm-up and Cool-down Performed on first and last piece of equipment    Resistance Training Performed Yes    VAD Patient? No    PAD/SET Patient? No      Pain Assessment   Currently in Pain? No/denies              Social History   Tobacco Use  Smoking Status Never Smoker  Smokeless Tobacco Never Used    Goals Met:  Independence with exercise equipment Exercise tolerated well No report of cardiac concerns or symptoms Strength training completed today  Goals Unmet:  Not Applicable  Comments: Pt able to follow exercise prescription today without complaint.  Will continue to monitor for progression.   Dr. Emily Filbert is Medical Director for [place] and [facility].

## 2019-09-23 ENCOUNTER — Telehealth: Payer: Self-pay

## 2019-09-23 DIAGNOSIS — I214 Non-ST elevation (NSTEMI) myocardial infarction: Secondary | ICD-10-CM

## 2019-09-23 NOTE — Telephone Encounter (Signed)
Patient called stating that she has been in pain (legs and lower back) since last week and is not comfortable coming to rehab this week. Encouraged to call doctor to discuss further evaluation.

## 2019-09-25 ENCOUNTER — Encounter: Payer: 59 | Attending: Cardiovascular Disease

## 2019-09-25 DIAGNOSIS — I252 Old myocardial infarction: Secondary | ICD-10-CM | POA: Insufficient documentation

## 2019-09-29 ENCOUNTER — Telehealth: Payer: Self-pay

## 2019-09-29 ENCOUNTER — Encounter: Payer: Self-pay | Admitting: *Deleted

## 2019-09-29 DIAGNOSIS — I214 Non-ST elevation (NSTEMI) myocardial infarction: Secondary | ICD-10-CM

## 2019-09-29 NOTE — Telephone Encounter (Signed)
Cheyenne Lopez called to say she wasn't here today due to continued pain and foot swelling.  She saw a chiropractor and a rheumatologist who tested for for myalgia.  Her CRP is elevated.  She also contacted her cardiologist about Lipitor as she had a reaction to Crestor with similar side effects.  She stopped taking it on her own. She will follow up once she hears from her Dr.

## 2019-10-02 ENCOUNTER — Ambulatory Visit (INDEPENDENT_AMBULATORY_CARE_PROVIDER_SITE_OTHER): Payer: 59

## 2019-10-02 ENCOUNTER — Other Ambulatory Visit: Payer: Self-pay

## 2019-10-02 ENCOUNTER — Ambulatory Visit
Admission: EM | Admit: 2019-10-02 | Discharge: 2019-10-02 | Disposition: A | Discharge status: Home / Self Care | Payer: 59 | Attending: Family Medicine | Admitting: Family Medicine

## 2019-10-02 DIAGNOSIS — M7989 Other specified soft tissue disorders: Secondary | ICD-10-CM | POA: Diagnosis not present

## 2019-10-02 DIAGNOSIS — Z7982 Long term (current) use of aspirin: Secondary | ICD-10-CM | POA: Insufficient documentation

## 2019-10-02 DIAGNOSIS — E109 Type 1 diabetes mellitus without complications: Secondary | ICD-10-CM | POA: Diagnosis not present

## 2019-10-02 DIAGNOSIS — Z794 Long term (current) use of insulin: Secondary | ICD-10-CM | POA: Insufficient documentation

## 2019-10-02 DIAGNOSIS — Z79899 Other long term (current) drug therapy: Secondary | ICD-10-CM | POA: Diagnosis not present

## 2019-10-02 DIAGNOSIS — I10 Essential (primary) hypertension: Secondary | ICD-10-CM | POA: Insufficient documentation

## 2019-10-02 DIAGNOSIS — R059 Cough, unspecified: Secondary | ICD-10-CM

## 2019-10-02 DIAGNOSIS — J9811 Atelectasis: Secondary | ICD-10-CM | POA: Diagnosis not present

## 2019-10-02 DIAGNOSIS — R0602 Shortness of breath: Secondary | ICD-10-CM

## 2019-10-02 DIAGNOSIS — R05 Cough: Secondary | ICD-10-CM

## 2019-10-02 DIAGNOSIS — Z955 Presence of coronary angioplasty implant and graft: Secondary | ICD-10-CM | POA: Insufficient documentation

## 2019-10-02 DIAGNOSIS — Z20822 Contact with and (suspected) exposure to covid-19: Secondary | ICD-10-CM | POA: Insufficient documentation

## 2019-10-02 DIAGNOSIS — E039 Hypothyroidism, unspecified: Secondary | ICD-10-CM | POA: Diagnosis not present

## 2019-10-02 NOTE — Discharge Instructions (Addendum)
Follow up with Primary care provider on Monday or go to Emergency Department if symptoms worsen over the weekend

## 2019-10-02 NOTE — ED Provider Notes (Signed)
[facility]    CSN: [id number] Arrival date & time: 10/02/19  1909      History   Chief Complaint Chief Complaint  Patient presents with   Cough   Shortness of Breath    HPI Cheyenne Lopez is a 69 y.o. female.   69 yo female with a c/o 2-3 days of cough, fatigue and shortness of breath. States cough is not productive. Denies any fevers, chills, chest pains, palpitations, wheezing. States she's notice some swelling of her left foot for the past week as well. Denies any falls, traumatic injury, calf or thigh pain, recent surgery, prolong immobilization.    Cough Associated symptoms: shortness of breath   Shortness of Breath Associated symptoms: cough     Past Medical History:  Diagnosis Date   Hypertension    Hypothyroid    Type 1 diabetes ([place])     Patient Active Problem List   Diagnosis Date Noted   Acute lower UTI 07/16/2019   Hypertension complicating diabetes ([place]) 07/16/2019   Type 1 diabetes mellitus without complication ([place]) 29/92/4268   NSTEMI (non-ST elevated myocardial infarction) ([place]) 07/15/2019   Chest pain 07/14/2019    Past Surgical History:  Procedure Laterality Date   BLADDER SURGERY     CORONARY STENT INTERVENTION N/A 07/15/2019   Procedure: CORONARY STENT INTERVENTION;  Surgeon: Yolonda Kida, MD;  Location: [facility];  Service: Cardiovascular;  Laterality: N/A;  CFX    LEFT HEART CATH AND CORONARY ANGIOGRAPHY N/A 07/15/2019   Procedure: LEFT HEART CATH AND CORONARY ANGIOGRAPHY;  Surgeon: Dionisio David, MD;  Location: [facility];  Service: Cardiovascular;  Laterality: N/A;   NO PAST SURGERIES      OB History   No obstetric history on file.      Home Medications    Prior to Admission medications   Medication Sig Start Date End Date Taking? Authorizing Provider  ascorbic acid (VITAMIN C) 100 MG tablet Take 100 mg by mouth daily.    [provider]  aspirin 81 MG chewable  tablet Chew 1 tablet (81 mg total) by mouth daily. 07/17/19   Shahmehdi, Valeria Batman, MD  atorvastatin (LIPITOR) 80 MG tablet Take 1 tablet (80 mg total) by mouth daily. 07/16/19   Adaline Sill, NP  Biotin 10 MG TABS Take 10 mg by mouth daily.    [provider]  chlorhexidine (PERIDEX) 0.12 % solution 30 mLs by Mouth Rinse route See admin instructions. Rinse as needed for mouth sores. 02/16/19   [provider]  Coenzyme Q10 (CO Q 10 PO) Take 1 capsule by mouth daily.    [provider]  docusate sodium (COLACE) 100 MG capsule Take 1 capsule (100 mg total) by mouth daily as needed. 06/23/19 06/22/20  Earleen Newport, MD  insulin detemir (LEVEMIR) 100 UNIT/ML injection Inject 18 Units into the skin See admin instructions. For use when not using the insulin pump. 12/30/18   [provider]  Insulin Human (INSULIN PUMP) SOLN Inject into the skin.    [provider]  insulin lispro (HUMALOG) 100 UNIT/ML injection Inject into the skin 3 (three) times daily before meals.    [provider]  latanoprost (XALATAN) 0.005 % ophthalmic solution 1 drop at bedtime.    [provider]  Multiple Vitamin (MULTIVITAMIN) capsule Take 1 capsule by mouth daily.    [provider]  nitroGLYCERIN (NITROSTAT) 0.4 MG SL tablet Place 1 tablet (0.4 mg total)  under the tongue every 5 (five) minutes as needed for chest pain. 07/16/19   Shahmehdi, Valeria Batman, MD  oxyCODONE-acetaminophen (PERCOCET) 5-325 MG tablet Take 1 tablet by mouth every 8 (eight) hours as needed. Patient not taking: Reported on 07/29/2019 07/16/19   Deatra James, MD  thyroid (ARMOUR) 65 MG tablet Take 65 mg by mouth daily.    [provider]  ticagrelor (BRILINTA) 90 MG TABS tablet Take 1 tablet (90 mg total) by mouth 2 (two) times daily. 07/16/19   Shahmehdi, Valeria Batman, MD  Travoprost, BAK Free, (TRAVATAN) 0.004 % SOLN ophthalmic solution Place 1 drop into both eyes at bedtime.  06/14/19   [provider]  traZODone (DESYREL) 50 MG tablet Take 25 mg by mouth at bedtime as needed. 02/26/19   [provider]  triamcinolone (NASACORT) 55 MCG/ACT AERO nasal inhaler Place 1 spray into the nose daily as needed. 08/18/09   [provider]  valsartan (DIOVAN) 40 MG tablet Take 20 mg by mouth daily.  10/27/17   [provider]  zinc sulfate (ZINC-220) 220 (50 Zn) MG capsule Take 220 mg by mouth daily.    [provider]  losartan (COZAAR) 25 MG tablet Take 25 mg by mouth daily.  02/07/19  [provider]  ranitidine (ZANTAC) 300 MG tablet  08/22/17 02/07/19  [provider]    Family History Family History  Problem Relation Age of Onset   Alzheimer's disease Mother    Heart failure Father    Breast cancer Neg Hx     Social History Social History   Tobacco Use   Smoking status: Never Smoker   Smokeless tobacco: Never Used  Scientific laboratory technician Use: Never used  Substance Use Topics   Alcohol use: No   Drug use: No     Allergies   No known allergies and Rosuvastatin   Review of Systems Review of Systems  Respiratory: Positive for cough and shortness of breath.      Physical Exam Triage Vital Signs ED Triage Vitals  Enc Vitals Group     BP 10/02/19 1917 (!) 143/65     Pulse Rate 10/02/19 1917 79     Resp 10/02/19 1917 20     Temp 10/02/19 1917 99.9 F (37.7 C)     Temp Source 10/02/19 1917 Oral     SpO2 10/02/19 1917 99 %     Weight --      Height --      Head Circumference --      Peak Flow --      Pain Score 10/02/19 1915 0     Pain Loc --      Pain Edu? --      Excl. in [place]? --    No data found.  Updated Vital Signs BP (!) 143/65 (BP Location: Left Arm)    Pulse 79    Temp 99.9 F (37.7 C) (Oral)    Resp 20    SpO2 99%   Visual Acuity Right Eye Distance:   Left Eye Distance:   Bilateral Distance:    Right Eye Near:   Left Eye Near:    Bilateral Near:     Physical  Exam Vitals and nursing note reviewed.  Constitutional:      General: She is not in acute distress.    Appearance: She is not toxic-appearing or diaphoretic.  Cardiovascular:     Rate and Rhythm: Normal rate and regular rhythm.  Heart sounds: Normal heart sounds.  Pulmonary:     Effort: Pulmonary effort is normal. No respiratory distress.     Breath sounds: Normal breath sounds. No stridor. No wheezing, rhonchi or rales.  Musculoskeletal:     Left lower leg: Edema (1+ pedal; no tibial edema; no calf tenderness) present.  Neurological:     Mental Status: She is alert.      UC Treatments / Results  Labs (all labs ordered are listed, but only abnormal results are displayed) Labs Reviewed  SARS CORONAVIRUS 2 (TAT 6-24 HRS)    EKG   Radiology DG Chest 2 View  Result Date: 10/02/2019 CLINICAL DATA:  Cough and shortness of breath. EXAM: CHEST - 2 VIEW COMPARISON:  July 14, 2019 FINDINGS: Mild bibasilar opacities favored represent atelectasis. The heart, hila, mediastinum, lungs, and pleura are unremarkable. IMPRESSION: Probable minimal atelectasis in the bases. No other suspicious findings. Electronically Signed   By: Dorise Bullion III M.D   On: 10/02/2019 20:06    Procedures ED EKG  Date/Time: 10/02/2019 8:41 PM Performed by: Norval Gable, MD Authorized by: Norval Gable, MD   Interpretation:    Interpretation: normal   Rate:    ECG rate:  79   ECG rate assessment: normal   Rhythm:    Rhythm: sinus rhythm   Ectopy:    Ectopy: none   QRS:    QRS axis:  Normal   QRS intervals:  Normal Conduction:    Conduction: normal   ST segments:    ST segments:  Normal T waves:    T waves: normal     (including critical care time)  Medications Ordered in UC Medications - No data to display  Initial Impression / Assessment and Plan / UC Course  I have reviewed the triage vital signs and the nursing notes.  Pertinent labs & imaging results that were available  during my care of the patient were reviewed by me and considered in my medical decision making (see chart for details).      Final Clinical Impressions(s) / UC Diagnoses   Final diagnoses:  Cough  Atelectasis     Discharge Instructions     Follow up with Primary care provider on Monday or go to Emergency Department if symptoms worsen over the weekend    ED Prescriptions    None     1. Chest x-ray/ekg results and diagnosis reviewed with patient 2. Recommend supportive treatment 3. Await covid test 4. Go to Emergency Department if symptoms get worse over the weekend  5. Follow-up with PCP on Monday   PDMP not reviewed this encounter.   Norval Gable, MD 10/02/19 2047

## 2019-10-02 NOTE — ED Triage Notes (Signed)
Pt is here with a cough and SOB that started 2 days ago, pt has not taken anything to relieve discomfort.

## 2019-10-03 LAB — SARS CORONAVIRUS 2 (TAT 6-24 HRS): SARS Coronavirus 2: NEGATIVE

## 2019-10-07 ENCOUNTER — Encounter: Payer: Self-pay | Admitting: *Deleted

## 2019-10-07 DIAGNOSIS — I214 Non-ST elevation (NSTEMI) myocardial infarction: Secondary | ICD-10-CM

## 2019-10-07 NOTE — Progress Notes (Signed)
Cardiac Individual Treatment Plan  Patient Details  Name: Lillianah Swartzentruber MRN: [id number] Date of Birth: 30-May-1950 Referring Provider:     Cardiac Rehab from 07/30/2019 in [facility] Cardiac and Pulmonary Rehab  Referring Provider Neoma Laming MD      Initial Encounter Date:    Cardiac Rehab from 07/30/2019 in [facility] Cardiac and Pulmonary Rehab  Date 07/30/19      Visit Diagnosis: NSTEMI (non-ST elevated myocardial infarction) [facility])  Patient's Home Medications on Admission:  Current Outpatient Medications:  .  ascorbic acid (VITAMIN C) 100 MG tablet, Take 100 mg by mouth daily., Disp: , Rfl:  .  aspirin 81 MG chewable tablet, Chew 1 tablet (81 mg total) by mouth daily., Disp: 30 tablet, Rfl: 0 .  atorvastatin (LIPITOR) 80 MG tablet, Take 1 tablet (80 mg total) by mouth daily., Disp: 90 tablet, Rfl: 0 .  Biotin 10 MG TABS, Take 10 mg by mouth daily., Disp: , Rfl:  .  chlorhexidine (PERIDEX) 0.12 % solution, 30 mLs by Mouth Rinse route See admin instructions. Rinse as needed for mouth sores., Disp: , Rfl:  .  Coenzyme Q10 (CO Q 10 PO), Take 1 capsule by mouth daily., Disp: , Rfl:  .  docusate sodium (COLACE) 100 MG capsule, Take 1 capsule (100 mg total) by mouth daily as needed., Disp: 30 capsule, Rfl: 2 .  insulin detemir (LEVEMIR) 100 UNIT/ML injection, Inject 18 Units into the skin See admin instructions. For use when not using the insulin pump., Disp: , Rfl:  .  Insulin Human (INSULIN PUMP) SOLN, Inject into the skin., Disp: , Rfl:  .  insulin lispro (HUMALOG) 100 UNIT/ML injection, Inject into the skin 3 (three) times daily before meals., Disp: , Rfl:  .  latanoprost (XALATAN) 0.005 % ophthalmic solution, 1 drop at bedtime., Disp: , Rfl:  .  Multiple Vitamin (MULTIVITAMIN) capsule, Take 1 capsule by mouth daily., Disp: , Rfl:  .  nitroGLYCERIN (NITROSTAT) 0.4 MG SL tablet, Place 1 tablet (0.4 mg total) under the tongue every 5 (five) minutes as needed for chest pain., Disp: 9 tablet, Rfl:  12 .  oxyCODONE-acetaminophen (PERCOCET) 5-325 MG tablet, Take 1 tablet by mouth every 8 (eight) hours as needed. (Patient not taking: Reported on 07/29/2019), Disp: 20 tablet, Rfl: 0 .  thyroid (ARMOUR) 65 MG tablet, Take 65 mg by mouth daily., Disp: , Rfl:  .  ticagrelor (BRILINTA) 90 MG TABS tablet, Take 1 tablet (90 mg total) by mouth 2 (two) times daily., Disp: 60 tablet, Rfl: 3 .  Travoprost, BAK Free, (TRAVATAN) 0.004 % SOLN ophthalmic solution, Place 1 drop into both eyes at bedtime., Disp: , Rfl:  .  traZODone (DESYREL) 50 MG tablet, Take 25 mg by mouth at bedtime as needed., Disp: , Rfl:  .  triamcinolone (NASACORT) 55 MCG/ACT AERO nasal inhaler, Place 1 spray into the nose daily as needed., Disp: , Rfl:  .  valsartan (DIOVAN) 40 MG tablet, Take 20 mg by mouth daily. , Disp: , Rfl: 11 .  zinc sulfate (ZINC-220) 220 (50 Zn) MG capsule, Take 220 mg by mouth daily., Disp: , Rfl:   Past Medical History: Past Medical History:  Diagnosis Date  . Hypertension   . Hypothyroid   . Type 1 diabetes (HCC)     Tobacco Use: Social History   Tobacco Use  Smoking Status Never Smoker  Smokeless Tobacco Never Used    Labs: Recent Review Scientist, physiological    Labs for ITP Cardiac and Pulmonary  Rehab Latest Ref Rng & Units 07/16/2019   Cholestrol 0 - 200 mg/dL 135   LDLCALC 0 - 99 mg/dL 79   HDL >40 mg/dL 45   Trlycerides <150 mg/dL 55   Hemoglobin A1c 4.8 - 5.6 % 6.9(H)       Exercise Target Goals: Exercise Program Goal: Individual exercise prescription set using results from initial 6 min walk test and THRR while considering  patient's activity barriers and safety.   Exercise Prescription Goal: Initial exercise prescription builds to 30-45 minutes a day of aerobic activity, 2-3 days per week.  Home exercise guidelines will be given to patient during program as part of exercise prescription that the participant will acknowledge.   Education: Aerobic Exercise & Resistance Training: -  Gives group verbal and written instruction on the various components of exercise. Focuses on aerobic and resistive training programs and the benefits of this training and how to safely progress through these programs..   Education: Exercise & Equipment Safety: - Individual verbal instruction and demonstration of equipment use and safety with use of the equipment.   Cardiac Rehab from 07/29/2019 in [facility] Cardiac and Pulmonary Rehab  Date 07/29/19  Educator [facility]  Instruction Review Code 1- Verbalizes Understanding      Education: Exercise Physiology & General Exercise Guidelines: - Group verbal and written instruction with models to review the exercise physiology of the cardiovascular system and associated critical values. Provides general exercise guidelines with specific guidelines to those with heart or lung disease.    Education: Flexibility, Balance, Mind/Body Relaxation: Provides group verbal/written instruction on the benefits of flexibility and balance training, including mind/body exercise modes such as yoga, pilates and tai chi.  Demonstration and skill practice provided.   Activity Barriers & Risk Stratification:  Activity Barriers & Cardiac Risk Stratification - 07/30/19 1308      Activity Barriers & Cardiac Risk Stratification   Activity Barriers Shortness of Breath;Deconditioning;Muscular Weakness;Other (comment);Balance Concerns    Comments previous vertigo, meds sometimes made it difficult to walk    Cardiac Risk Stratification Moderate           6 Minute Walk:  6 Minute Walk    Row Name 07/30/19 1305         6 Minute Walk   Phase Initial     Distance 1265 feet     Walk Time 6 minutes     # of Rest Breaks 0     MPH 2.4     METS 2.75     RPE 11     VO2 Peak 9.64     Symptoms Yes (comment)     Comments fatigue, r hip pain/dull ache 2-3/10     Resting HR 63 bpm     Resting BP 138/72     Resting Oxygen Saturation  99 %     Exercise Oxygen Saturation  during 6  min walk 96 %     Max Ex. HR 87 bpm     Max Ex. BP 144/74     2 Minute Post BP 128/70            Oxygen Initial Assessment:   Oxygen Re-Evaluation:   Oxygen Discharge (Final Oxygen Re-Evaluation):   Initial Exercise Prescription:  Initial Exercise Prescription - 07/30/19 1300      Date of Initial Exercise RX and Referring Provider   Date 07/30/19    Referring Provider Neoma Laming MD      Treadmill   MPH 2.3  Grade 0    Minutes 15    METs 2.76      Elliptical   Level 1    Speed 2.3    Minutes 15    METs 2      REL-XR   Level 1    Speed 50    Minutes 15    METs 2      Prescription Details   Frequency (times per week) 3    Duration Progress to 30 minutes of continuous aerobic without signs/symptoms of physical distress      Intensity   THRR 40-80% of Max Heartrate 99-134    Ratings of Perceived Exertion 11-13    Perceived Dyspnea 0-4      Progression   Progression Continue to progress workloads to maintain intensity without signs/symptoms of physical distress.      Resistance Training   Training Prescription Yes    Weight 3 lb    Reps 10-15           Perform Capillary Blood Glucose checks as needed.  Exercise Prescription Changes:  Exercise Prescription Changes    Row Name 07/30/19 1300 08/18/19 1600 08/31/19 1600 09/04/19 1100 09/15/19 1400     Response to Exercise   Blood Pressure (Admit) 138/72 122/82 122/56 -- 100/62   Blood Pressure (Exercise) 144/74 162/58 150/70 -- 140/58   Blood Pressure (Exit) 128/70 118/58 112/60 -- 104/60   Heart Rate (Admit) 63 bpm 72 bpm 73 bpm -- 58 bpm   Heart Rate (Exercise) 87 bpm 100 bpm 106 bpm -- 113 bpm   Heart Rate (Exit) 63 bpm 83 bpm 82 bpm -- 73 bpm   Oxygen Saturation (Admit) 99 % -- -- -- --   Oxygen Saturation (Exercise) 96 % -- -- -- --   Rating of Perceived Exertion (Exercise) _0 -- 13   Symptoms fatigue, r hip pain dull ache 2-3/10 none none -- none   Comments walk test results --  -- -- --   Duration -- Continue with 30 min of aerobic exercise without signs/symptoms of physical distress. Continue with 30 min of aerobic exercise without signs/symptoms of physical distress. -- Continue with 30 min of aerobic exercise without signs/symptoms of physical distress.   Intensity -- THRR unchanged THRR unchanged -- THRR unchanged     Progression   Progression -- Continue to progress workloads to maintain intensity without signs/symptoms of physical distress. Continue to progress workloads to maintain intensity without signs/symptoms of physical distress. -- Continue to progress workloads to maintain intensity without signs/symptoms of physical distress.   Average METs -- 3.5 3.51 -- 3     Resistance Training   Training Prescription -- Yes Yes -- Yes   Weight -- 3 lb 3 lb -- 3 lb   Reps -- 10-15 10-15 -- 10-15     Interval Training   Interval Training -- -- No -- No     Treadmill   MPH -- 2.3 2.9 -- 2.7   Grade -- 0.5 0.5 -- 1.5   Minutes -- 15 15 -- 15   METs -- 2.92 3.42 -- 3.63     NuStep   Level -- -- -- -- 3   SPM -- -- -- -- 80   Minutes -- -- -- -- 15   METs -- -- -- -- 2.4     REL-XR   Level -- -- 1 -- --   Minutes -- -- 15 -- --   METs -- -- 5 -- --  T5 Nustep   Level -- 3 2 -- --   SPM -- 80 -- -- --   Minutes -- 15 15 -- --   METs -- 2 2.1 -- --     Home Exercise Plan   Plans to continue exercise at -- -- -- Home (comment)  walking and staff videos --   Frequency -- -- -- Add 2 additional days to program exercise sessions. --   Initial Home Exercises Provided -- -- -- 09/04/19 --   [place] Name 09/29/19 1400             Response to Exercise   Blood Pressure (Admit) 102/58       Blood Pressure (Exercise) 126/64       Blood Pressure (Exit) 104/56       Heart Rate (Admit) 89 bpm       Heart Rate (Exercise) 94 bpm       Heart Rate (Exit) 75 bpm       Rating of Perceived Exertion (Exercise) 12       Symptoms none       Duration Continue  with 30 min of aerobic exercise without signs/symptoms of physical distress.       Intensity THRR unchanged         Progression   Progression Continue to progress workloads to maintain intensity without signs/symptoms of physical distress.       Average METs 3.04         Resistance Training   Training Prescription Yes       Weight 3 lb       Reps 10-15         Interval Training   Interval Training No         Treadmill   MPH 2.9       Grade 2.5       Minutes 15       METs 4.22         REL-XR   Level 3       Minutes 15       METs 2.9         T5 Nustep   Level 3       Minutes 15       METs 2         Home Exercise Plan   Plans to continue exercise at Home (comment)  walking and staff videos       Frequency Add 2 additional days to program exercise sessions.       Initial Home Exercises Provided 09/04/19              Exercise Comments:   Exercise Goals and Review:  Exercise Goals    Row Name 07/30/19 1312             Exercise Goals   Increase Physical Activity Yes       Intervention Provide advice, education, support and counseling about physical activity/exercise needs.;Develop an individualized exercise prescription for aerobic and resistive training based on initial evaluation findings, risk stratification, comorbidities and participant's personal goals.       Expected Outcomes Short Term: Attend rehab on a regular basis to increase amount of physical activity.;Long Term: Add in home exercise to make exercise part of routine and to increase amount of physical activity.;Long Term: Exercising regularly at least 3-5 days a week.       Increase Strength and Stamina Yes       Intervention Provide advice, education,  support and counseling about physical activity/exercise needs.;Develop an individualized exercise prescription for aerobic and resistive training based on initial evaluation findings, risk stratification, comorbidities and participant's personal goals.        Expected Outcomes Short Term: Increase workloads from initial exercise prescription for resistance, speed, and METs.;Short Term: Perform resistance training exercises routinely during rehab and add in resistance training at home;Long Term: Improve cardiorespiratory fitness, muscular endurance and strength as measured by increased METs and functional capacity (6MWT)       Able to understand and use rate of perceived exertion (RPE) scale Yes       Intervention Provide education and explanation on how to use RPE scale       Expected Outcomes Short Term: Able to use RPE daily in rehab to express subjective intensity level;Long Term:  Able to use RPE to guide intensity level when exercising independently       Able to understand and use Dyspnea scale Yes       Intervention Provide education and explanation on how to use Dyspnea scale       Expected Outcomes Short Term: Able to use Dyspnea scale daily in rehab to express subjective sense of shortness of breath during exertion;Long Term: Able to use Dyspnea scale to guide intensity level when exercising independently       Knowledge and understanding of Target Heart Rate Range (THRR) Yes       Intervention Provide education and explanation of THRR including how the numbers were predicted and where they are located for reference       Expected Outcomes Short Term: Able to state/look up THRR;Long Term: Able to use THRR to govern intensity when exercising independently;Short Term: Able to use daily as guideline for intensity in rehab       Able to check pulse independently Yes       Intervention Provide education and demonstration on how to check pulse in carotid and radial arteries.;Review the importance of being able to check your own pulse for safety during independent exercise       Expected Outcomes Short Term: Able to explain why pulse checking is important during independent exercise;Long Term: Able to check pulse independently and accurately        Understanding of Exercise Prescription Yes       Intervention Provide education, explanation, and written materials on patient's individual exercise prescription       Expected Outcomes Short Term: Able to explain program exercise prescription;Long Term: Able to explain home exercise prescription to exercise independently              Exercise Goals Re-Evaluation :  Exercise Goals Re-Evaluation    Row Name 08/05/19 1023 08/18/19 1607 08/31/19 1603 09/04/19 1040 09/14/19 1025     Exercise Goal Re-Evaluation   Exercise Goals Review Increase Physical Activity;Able to understand and use rate of perceived exertion (RPE) scale;Knowledge and understanding of Target Heart Rate Range (THRR);Increase Strength and Stamina;Able to check pulse independently Increase Physical Activity;Increase Strength and Stamina;Able to understand and use rate of perceived exertion (RPE) scale;Able to understand and use Dyspnea scale;Knowledge and understanding of Target Heart Rate Range (THRR);Able to check pulse independently;Understanding of Exercise Prescription Increase Physical Activity;Increase Strength and Stamina;Understanding of Exercise Prescription Increase Physical Activity;Increase Strength and Stamina;Understanding of Exercise Prescription Increase Physical Activity;Increase Strength and Stamina;Understanding of Exercise Prescription   Comments Reviewed RPE and dyspnea scales, THR and program prescription with pt today.  Pt voiced understanding and was given a copy  of goals to take home. Irja is doing well in rehab.  She has increased incline on TM.  Staff willmonitor progress. Myrikal continues to do well in rehab.  She is now up to 5 METs on the XR. We will continue to montior her progress. Chala is doing well in rehab.  She is getting in some already for 25 min of walking.  We talked about increasing to 30 min.  Reviewed home exercise with pt today.  Pt plans to continue walking at home for exercise.  She is to  increase her time to 30 min.  Reviewed THR, pulse, RPE, sign and symptoms, pulse oximetery and when to call 911 or MD.  Also discussed weather considerations and indoor options.  Pt voiced understanding. [place] does walk at home - she gets a litle out of breath when walking uphill.   She will try to use staff videos when its too hot to walk outside.   Expected Outcomes Short: Use RPE daily to regulate intensity. Long: Follow program prescription in THR. Short:  attend consistently Long: increase overall stamina Short: Increase workloads Long: Continue to improve stamina. Short: Increase exercise time at home to 30 min.  Long; Continue to improve stamina. Short: continue to walk at home Long: improve stamina overall   Row Name 09/29/19 1421             Exercise Goal Re-Evaluation   Exercise Goals Review Increase Physical Activity;Increase Strength and Stamina;Understanding of Exercise Prescription       Comments Jayelle has been out with pain since 09/18/19.  She is working with her doctor.  We will continue to monitor her progress.       Expected Outcomes Short: Recover from pain  Long; Conitnue to improve stamina.              Discharge Exercise Prescription (Final Exercise Prescription Changes):  Exercise Prescription Changes - 09/29/19 1400      Response to Exercise   Blood Pressure (Admit) 102/58    Blood Pressure (Exercise) 126/64    Blood Pressure (Exit) 104/56    Heart Rate (Admit) 89 bpm    Heart Rate (Exercise) 94 bpm    Heart Rate (Exit) 75 bpm    Rating of Perceived Exertion (Exercise) 12    Symptoms none    Duration Continue with 30 min of aerobic exercise without signs/symptoms of physical distress.    Intensity THRR unchanged      Progression   Progression Continue to progress workloads to maintain intensity without signs/symptoms of physical distress.    Average METs 3.04      Resistance Training   Training Prescription Yes    Weight 3 lb    Reps 10-15      Interval  Training   Interval Training No      Treadmill   MPH 2.9    Grade 2.5    Minutes 15    METs 4.22      REL-XR   Level 3    Minutes 15    METs 2.9      T5 Nustep   Level 3    Minutes 15    METs 2      Home Exercise Plan   Plans to continue exercise at Home (comment)   walking and staff videos   Frequency Add 2 additional days to program exercise sessions.    Initial Home Exercises Provided 09/04/19           Nutrition:  Target Goals: Understanding of nutrition guidelines, daily intake of sodium '1500mg'$ , cholesterol '200mg'$ , calories 30% from fat and 7% or less from saturated fats, daily to have 5 or more servings of fruits and vegetables.  Education: Controlling Sodium/Reading Food Labels -Group verbal and written material supporting the discussion of sodium use in heart healthy nutrition. Review and explanation with models, verbal and written materials for utilization of the food label.   Education: General Nutrition Guidelines/Fats and Fiber: -Group instruction provided by verbal, written material, models and posters to present the general guidelines for heart healthy nutrition. Gives an explanation and review of dietary fats and fiber.   Biometrics:  Pre Biometrics - 07/30/19 1313      Pre Biometrics   Height 5' 0.75" (1.543 m)    Weight 146 lb 14.4 oz (66.6 kg)    BMI (Calculated) 27.99    Single Leg Stand 13.6 seconds            Nutrition Therapy Plan and Nutrition Goals:  Nutrition Therapy & Goals - 08/17/19 1024      Nutrition Therapy   Diet Low Na, heart healthy, diabetes friendly    Protein (specify units) 55-60g    Fiber 25 grams    Whole Grain Foods 3 servings    Saturated Fats 12 max. grams    Fruits and Vegetables 5 servings/day    Sodium 1.5 grams      Personal Nutrition Goals   Nutrition Goal ST: add in fats to meals, add cannellini beans to avocado toast to add protein, indentify high Na sources in diet LT: Wants to get back to gardening  and other activities she enjoys    Comments T1DM 47 Years in July. A1c 6.9. B: coffee (stevia)(oatmilk, almond), cereal (oatmeal and cheerios - oatmilk) L: Salad  and chicken D: salad (kits - vinegarette) and chicken or fish or couscous or bulgur wheat, beans. Hunger levels normal throughout the day due to lower appetite. Pt uses olive oil. Discussed heart healthy eating, balance, variety, and diabetes.      Intervention Plan   Intervention Prescribe, educate and counsel regarding individualized specific dietary modifications aiming towards targeted core components such as weight, hypertension, lipid management, diabetes, heart failure and other comorbidities.;Nutrition handout(s) given to patient.    Expected Outcomes Short Term Goal: Understand basic principles of dietary content, such as calories, fat, sodium, cholesterol and nutrients.;Short Term Goal: A plan has been developed with personal nutrition goals set during dietitian appointment.;Long Term Goal: Adherence to prescribed nutrition plan.   Low sodium handout and flavor handout in addition to general guidelines.          Nutrition Assessments:  Nutrition Assessments - 07/30/19 1314      MEDFICTS Scores   Pre Score 13           MEDIFICTS Score Key:          ?70 Need to make dietary changes          40-70 Heart Healthy Diet         ? 40 Therapeutic Level Cholesterol Diet  Nutrition Goals Re-Evaluation:  Nutrition Goals Re-Evaluation    [place] Name 09/14/19 1023             Goals   Nutrition Goal Holy has added beans to toast for protein.  She has been watching sodium.  We discussed Mrs Deliah Boston or other no sodium options.       Expected Outcome Short: continue to watch sodium Long: maintain  healthy diet to manage DM and heart health              Nutrition Goals Discharge (Final Nutrition Goals Re-Evaluation):  Nutrition Goals Re-Evaluation - 09/14/19 1023      Goals   Nutrition Goal Sarahbeth has added beans to toast for  protein.  She has been watching sodium.  We discussed Mrs Deliah Boston or other no sodium options.    Expected Outcome Short: continue to watch sodium Long: maintain healthy diet to manage DM and heart health           Psychosocial: Target Goals: Acknowledge presence or absence of significant depression and/or stress, maximize coping skills, provide positive support system. Participant is able to verbalize types and ability to use techniques and skills needed for reducing stress and depression.   Education: Depression - Provides group verbal and written instruction on the correlation between heart/lung disease and depressed mood, treatment options, and the stigmas associated with seeking treatment.   Education: Sleep Hygiene -Provides group verbal and written instruction about how sleep can affect your health.  Define sleep hygiene, discuss sleep cycles and impact of sleep habits. Review good sleep hygiene tips.     Education: Stress and Anxiety: - Provides group verbal and written instruction about the health risks of elevated stress and causes of high stress.  Discuss the correlation between heart/lung disease and anxiety and treatment options. Review healthy ways to manage with stress and anxiety.    Initial Review & Psychosocial Screening:  Initial Psych Review & Screening - 07/29/19 1336      Initial Review   Current issues with None Identified      Family Dynamics   Good Support System? Yes    Comments She can look  to her husband, son and borther inlaw for support. She has a positive outlook on her health.      Barriers   Psychosocial barriers to participate in program There are no identifiable barriers or psychosocial needs.      Screening Interventions   Interventions Encouraged to exercise;Provide feedback about the scores to participant;To provide support and resources with identified psychosocial needs    Expected Outcomes Short Term goal: Utilizing psychosocial counselor,  staff and physician to assist with identification of specific Stressors or current issues interfering with healing process. Setting desired goal for each stressor or current issue identified.;Short Term goal: Identification and review with participant of any Quality of Life or Depression concerns found by scoring the questionnaire.;Long Term Goal: Stressors or current issues are controlled or eliminated.;Long Term goal: The participant improves quality of Life and PHQ9 Scores as seen by post scores and/or verbalization of changes           Quality of Life Scores:   Quality of Life - 07/30/19 1313      Quality of Life   Select Quality of Life      Quality of Life Scores   Health/Function Pre 24.46 %    Socioeconomic Pre 27 %    Psych/Spiritual Pre 26.57 %    Family Pre 26.4 %    GLOBAL Pre 25.74 %          Scores of 19 and below usually indicate a poorer quality of life in these areas.  A difference of  2-3 points is a clinically meaningful difference.  A difference of 2-3 points in the total score of the Quality of Life Index has been associated with significant improvement in overall quality of life, self-image,  physical symptoms, and general health in studies assessing change in quality of life.  PHQ-9: Recent Review Flowsheet Data    Depression screen [facility] 2/9 08/14/2019 07/30/2019   Decreased Interest 0 0   Down, Depressed, Hopeless 0 0   PHQ - 2 Score 0 0   Altered sleeping 0 1   Tired, decreased energy 0 3   Change in appetite 0 0   Feeling bad or failure about yourself  0 0   Trouble concentrating 0 1   Moving slowly or fidgety/restless 0 0   Suicidal thoughts 0 0   PHQ-9 Score 0 5   Difficult doing work/chores - Somewhat difficult     Interpretation of Total Score  Total Score Depression Severity:  1-4 = Minimal depression, 5-9 = Mild depression, 10-14 = Moderate depression, 15-19 = Moderately severe depression, 20-27 = Severe depression   Psychosocial Evaluation and  Intervention:  Psychosocial Evaluation - 07/29/19 1401      Psychosocial Evaluation & Interventions   Interventions Encouraged to exercise with the program and follow exercise prescription    Comments Patient has a positive outlook on her health and states no depression issues.    Expected Outcomes Short: Attend [facility] stress management education to decrease stress. Long: Maintain exercise Post HeartTrack to keep stress at a minimum.    Continue Psychosocial Services  Follow up required by staff           Psychosocial Re-Evaluation:  Psychosocial Re-Evaluation    [place] Name 09/04/19 1043 09/14/19 1021           Psychosocial Re-Evaluation   Current issues with Current Stress Concerns;Current Sleep Concerns --      Comments Timi is doing well, she does admit to having some down days, but overall is good.  She denies major stressors in life.  Her biggest concern is waking up at 4am daily.  She will start to play games or read to help decompress for a little bit.  Overall, she is able to get about 6 hours of sleep.  We talked about reviewing the sleep videos online.  They are also getting their house ready to move. Kristl is still waking up at 3:30-4 am.  She doesnt feel rested.  She does read or play games and does get sleepy again in a couple hours but has to get up for the day.      Expected Outcomes Short: Watch sleep videos Long: Continue to stay positive. Short: watch videos Long: develop better sleep patterns      Interventions Stress management education;Encouraged to attend Cardiac Rehabilitation for the exercise --      Continue Psychosocial Services  Follow up required by staff --             Psychosocial Discharge (Final Psychosocial Re-Evaluation):  Psychosocial Re-Evaluation - 09/14/19 1021      Psychosocial Re-Evaluation   Comments Shakenya is still waking up at 3:30-4 am.  She doesnt feel rested.  She does read or play games and does get sleepy again in a couple hours but  has to get up for the day.    Expected Outcomes Short: watch videos Long: develop better sleep patterns           Vocational Rehabilitation: Provide vocational rehab assistance to qualifying candidates.   Vocational Rehab Evaluation & Intervention:   Education: Education Goals: Education classes will be provided on a variety of topics geared toward better understanding of heart health and risk factor modification.  Participant will state understanding/return demonstration of topics presented as noted by education test scores.  Learning Barriers/Preferences:  Learning Barriers/Preferences - 07/29/19 1338      Learning Barriers/Preferences   Learning Barriers None    Learning Preferences None           General Cardiac Education Topics:  AED/CPR: - Group verbal and written instruction with the use of models to demonstrate the basic use of the AED with the basic ABC's of resuscitation.   Anatomy & Physiology of the Heart: - Group verbal and written instruction and models provide basic cardiac anatomy and physiology, with the coronary electrical and arterial systems. Review of Valvular disease and Heart Failure   Cardiac Procedures: - Group verbal and written instruction to review commonly prescribed medications for heart disease. Reviews the medication, class of the drug, and side effects. Includes the steps to properly store meds and maintain the prescription regimen. (beta blockers and nitrates)   Cardiac Medications I: - Group verbal and written instruction to review commonly prescribed medications for heart disease. Reviews the medication, class of the drug, and side effects. Includes the steps to properly store meds and maintain the prescription regimen.   Cardiac Medications II: -Group verbal and written instruction to review commonly prescribed medications for heart disease. Reviews the medication, class of the drug, and side effects. (all other drug classes)    Go  Sex-Intimacy & Heart Disease, Get SMART - Goal Setting: - Group verbal and written instruction through game format to discuss heart disease and the return to sexual intimacy. Provides group verbal and written material to discuss and apply goal setting through the application of the S.M.A.R.T. Method.   Other Matters of the Heart: - Provides group verbal, written materials and models to describe Stable Angina and Peripheral Artery. Includes description of the disease process and treatment options available to the cardiac patient.   Infection Prevention: - Provides verbal and written material to individual with discussion of infection control including proper hand washing and proper equipment cleaning during exercise session.   Cardiac Rehab from 07/29/2019 in [facility] Cardiac and Pulmonary Rehab  Date 07/29/19  Educator [facility]  Instruction Review Code 1- Verbalizes Understanding      Falls Prevention: - Provides verbal and written material to individual with discussion of falls prevention and safety.   Cardiac Rehab from 07/29/2019 in [facility] Cardiac and Pulmonary Rehab  Date 07/29/19  Educator [facility]  Instruction Review Code 1- Verbalizes Understanding      Other: -Provides group and verbal instruction on various topics (see comments)   Knowledge Questionnaire Score:  Knowledge Questionnaire Score - 07/30/19 1314      Knowledge Questionnaire Score   Pre Score 23/26 Education Focus: exercise           Core Components/Risk Factors/Patient Goals at Admission:  Personal Goals and Risk Factors at Admission - 07/30/19 1314      Core Components/Risk Factors/Patient Goals on Admission    Weight Management Yes;Weight Loss    Intervention Weight Management: Develop a combined nutrition and exercise program designed to reach desired caloric intake, while maintaining appropriate intake of nutrient and fiber, sodium and fats, and appropriate energy expenditure required for the weight goal.;Weight  Management: Provide education and appropriate resources to help participant work on and attain dietary goals.    Admit Weight 146 lb 14.4 oz (66.6 kg)    Goal Weight: Short Term 141 lb (64 kg)    Goal Weight: Long Term 135 lb (61.2 kg)  Expected Outcomes Short Term: Continue to assess and modify interventions until short term weight is achieved;Weight Loss: Understanding of general recommendations for a balanced deficit meal plan, which promotes 1-2 lb weight loss per week and includes a negative energy balance of [phone] kcal/d;Understanding recommendations for meals to include 15-35% energy as protein, 25-35% energy from fat, 35-60% energy from carbohydrates, less than '200mg'$  of dietary cholesterol, 20-35 gm of total fiber daily;Understanding of distribution of calorie intake throughout the day with the consumption of 4-5 meals/snacks    Diabetes Yes    Intervention Provide education about signs/symptoms and action to take for hypo/hyperglycemia.;Provide education about proper nutrition, including hydration, and aerobic/resistive exercise prescription along with prescribed medications to achieve blood glucose in normal ranges: Fasting glucose 65-99 mg/dL    Expected Outcomes Short Term: Participant verbalizes understanding of the signs/symptoms and immediate care of hyper/hypoglycemia, proper foot care and importance of medication, aerobic/resistive exercise and nutrition plan for blood glucose control.;Long Term: Attainment of HbA1C < 7%.    Hypertension Yes    Intervention Provide education on lifestyle modifcations including regular physical activity/exercise, weight management, moderate sodium restriction and increased consumption of fresh fruit, vegetables, and low fat dairy, alcohol moderation, and smoking cessation.;Monitor prescription use compliance.    Expected Outcomes Short Term: Continued assessment and intervention until BP is < 140/30m HG in hypertensive participants. < 130/855mHG in  hypertensive participants with diabetes, heart failure or chronic kidney disease.;Long Term: Maintenance of blood pressure at goal levels.    Lipids Yes    Intervention Provide education and support for participant on nutrition & aerobic/resistive exercise along with prescribed medications to achieve LDL '70mg'$ , HDL >'40mg'$ .    Expected Outcomes Short Term: Participant states understanding of desired cholesterol values and is compliant with medications prescribed. Participant is following exercise prescription and nutrition guidelines.;Long Term: Cholesterol controlled with medications as prescribed, with individualized exercise RX and with personalized nutrition plan. Value goals: LDL < '70mg'$ , HDL > 40 mg.           Education:Diabetes - Individual verbal and written instruction to review signs/symptoms of diabetes, desired ranges of glucose level fasting, after meals and with exercise. Acknowledge that pre and post exercise glucose checks will be done for 3 sessions at entry of program.   [facility]  Date 07/29/19  [facility]- Verbal[facility] Understanding      Education: Know Your Numbers and Risk Factors: -Group verbal and written instruction about important numbers in your health.  Discussion of what are risk factors and how they play a role in the disease process.  Review of Cholesterol, Blood Pressure, Diabetes, and BMI and the role they play in your overall health.   Core Components/Risk Factors/Patient Goals Review:   Goals and Risk Factor Review    Row Name 09/04/19 1046 09/14/19 1019           Core Components/Risk Factors/Patient Goals Review   Personal Goals Review Weight Management/Obesity;Hypertension;Diabetes;Lipids Weight Management/Obesity;Hypertension;Diabetes;Lipids      Review AlBrittans doing well in rehab, she has already lost a couple of pounds.  Her blood sugars have been erratic and stress will  trigger a reaction and we talked about how exercise can help.  Blood pressures have been good overall in class. She does not check it at home as things are scattered getting ready to move. Adana monitors her BG at home.  They still have been erratic - she  sees Dr. in September.  Advised her to call if needed before then.      Expected Outcomes Short: Get sugars under better control. Long Continue to monitor risk factors. Short: follow up with Dr about sugars Long: manage risk factors             Core Components/Risk Factors/Patient Goals at Discharge (Final Review):   Goals and Risk Factor Review - 09/14/19 1019      Core Components/Risk Factors/Patient Goals Review   Personal Goals Review Weight Management/Obesity;Hypertension;Diabetes;Lipids    Review Sansa monitors her BG at home.  They still have been erratic - she sees Dr. in September.  Advised her to call if needed before then.    Expected Outcomes Short: follow up with Dr about sugars Long: manage risk factors           ITP Comments:  ITP Comments    Row Name 07/29/19 1344 07/30/19 1304 08/05/19 1022 08/12/19 0744 09/09/19 0645   ITP Comments Virtual Visit Completed. Patient informed of EP and RD schedule. Visit diagnosis can be found in [facility] 07/14/2019 Completed 6MWT and gym orientation.  Initial ITP created and sent for review to Dr. Emily Filbert, Medical Director. First full day of exercise!  Patient was oriented to gym and equipment including functions, settings, policies, and procedures.  Patient's individual exercise prescription and treatment plan were reviewed.  All starting workloads were established based on the results of the 6 minute walk test done at initial orientation visit.  The plan for exercise progression was also introduced and progression will be customized based on patient's performance and goals 30 Day review completed. ITP review done, changes made as directed,and approval shown by signature of Careers adviser.  New to program 30 Day review completed. Medical Director ITP review done, changes made as directed, and signed approval by Medical Director.   [place] Name 09/23/19 0902 09/29/19 1421 10/07/19 1109       ITP Comments Patient called stating that she has been in pain (legs and lower back) since last week and is not comfortable coming to rehab this week. Encouraged to call doctor to discuss further evaluation Shian called to say she wasn't here today due to continued pain and foot swelling.  She saw a chiropractor and a rheumatologist who tested for for myalgia.  Her CRP is elevated.  She also contacted her cardiologist about Lipitor as she had a reaction Anne called to say she wasn't here today due to continued pain and foot swelling.  She saw a chiropractor and a rheumatologist who tested for for myalgia.  Her CRP is elevated.  She also contacted her cardiologist about Lipitor as she had a reaction to Crestor with similar side effects.  She stopped taking it on her own.She will follow up once she hears from her Dr.to Crestor with similar side effects.  She stopped taking it on her own.She will follow up once she hears from her Dr. 9 Day review completed. Medical Director ITP review done, changes made as directed, and signed approval by Medical Director.            Comments:

## 2019-10-08 ENCOUNTER — Telehealth: Payer: Self-pay

## 2019-10-08 NOTE — Telephone Encounter (Signed)
Cheyenne Lopez plans to return to [facility] at some point  She is waiting to hear about a treatment plan that she doesn't have to take prednisone.  She will let us know when she feels she can return.

## 2019-10-08 NOTE — Telephone Encounter (Signed)
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  After speaking with other EP we feel Tatumn can return to cardiac rehab and she would like to.

## 2019-10-21 ENCOUNTER — Telehealth: Payer: Self-pay

## 2019-10-21 NOTE — Telephone Encounter (Signed)
Odyssey plans to return to class Friday

## 2019-10-23 ENCOUNTER — Other Ambulatory Visit: Payer: Self-pay

## 2019-10-23 ENCOUNTER — Encounter: Payer: 59 | Admitting: *Deleted

## 2019-10-23 DIAGNOSIS — I252 Old myocardial infarction: Secondary | ICD-10-CM | POA: Diagnosis present

## 2019-10-23 DIAGNOSIS — I214 Non-ST elevation (NSTEMI) myocardial infarction: Secondary | ICD-10-CM

## 2019-10-23 NOTE — Progress Notes (Signed)
Daily Session Note  Patient Details  Name: Cheyenne Lopez MRN: [id number] Date of Birth: 08-Aug-1950 Referring Provider:     Cardiac Rehab from 07/30/2019 in [facility] Cardiac and Pulmonary Rehab  Referring Provider Neoma Laming MD      Encounter Date: 10/23/2019  Check In:  Session Check In - 10/23/19 0951      Check-In   Supervising physician immediately available to respond to emergencies See telemetry face sheet for immediately available ER MD    Location [facility]-Cardiac & Pulmonary Rehab    Staff Present Renita Papa, RN BSN;Joseph [address] [place], [place], RCEP, CCRP, CCET    Virtual Visit No    Medication changes reported     No    Fall or balance concerns reported    No    Warm-up and Cool-down Performed on first and last piece of equipment    Resistance Training Performed Yes    VAD Patient? No    PAD/SET Patient? No      Pain Assessment   Currently in Pain? No/denies              Social History   Tobacco Use  Smoking Status Never Smoker  Smokeless Tobacco Never Used    Goals Met:  Independence with exercise equipment Exercise tolerated well No report of cardiac concerns or symptoms Strength training completed today  Goals Unmet:  Not Applicable  Comments: Pt able to follow exercise prescription today without complaint.  Will continue to monitor for progression.    Dr. Emily Filbert is Medical Director for [place] and [facility].

## 2019-10-26 ENCOUNTER — Encounter: Payer: 59 | Attending: Cardiovascular Disease | Admitting: *Deleted

## 2019-10-26 ENCOUNTER — Other Ambulatory Visit: Payer: Self-pay

## 2019-10-26 DIAGNOSIS — I214 Non-ST elevation (NSTEMI) myocardial infarction: Secondary | ICD-10-CM

## 2019-10-26 DIAGNOSIS — I252 Old myocardial infarction: Secondary | ICD-10-CM | POA: Insufficient documentation

## 2019-10-26 NOTE — Progress Notes (Signed)
Daily Session Note  Patient Details  Name: Donja Tipping MRN: [id number] Date of Birth: 04/13/1950 Referring Provider:     Cardiac Rehab from 07/30/2019 in [facility] Cardiac and Pulmonary Rehab  Referring Provider Neoma Laming MD      Encounter Date: 10/26/2019  Check In:  Session Check In - 10/26/19 0958      Check-In   Supervising physician immediately available to respond to emergencies See telemetry face sheet for immediately available ER MD    Location [facility]-Cardiac & Pulmonary Rehab    Staff Present Heath Lark, RN, BSN, Laveda Norman, BS, ACSM CEP, Exercise Physiologist;Joseph Tessie Fass RCP,RRT,BSRT    Virtual Visit No    Medication changes reported     Yes    Comments Lipitor stopped    Fall or balance concerns reported    No    Warm-up and Cool-down Performed on first and last piece of equipment    Resistance Training Performed Yes    VAD Patient? No    PAD/SET Patient? No      Pain Assessment   Currently in Pain? No/denies              Social History   Tobacco Use  Smoking Status Never Smoker  Smokeless Tobacco Never Used    Goals Met:  Independence with exercise equipment Exercise tolerated well No report of cardiac concerns or symptoms  Goals Unmet:  Not Applicable  Comments: Pt able to follow exercise prescription today without complaint.  Will continue to monitor for progression.    Dr. Emily Filbert is Medical Director for [place] and [facility].

## 2019-10-28 ENCOUNTER — Encounter: Payer: 59 | Admitting: *Deleted

## 2019-10-28 ENCOUNTER — Other Ambulatory Visit: Payer: Self-pay

## 2019-10-28 DIAGNOSIS — I252 Old myocardial infarction: Secondary | ICD-10-CM | POA: Diagnosis not present

## 2019-10-28 DIAGNOSIS — I214 Non-ST elevation (NSTEMI) myocardial infarction: Secondary | ICD-10-CM

## 2019-10-28 NOTE — Progress Notes (Signed)
Daily Session Note  Patient Details  Name: Cheyenne Lopez MRN: [id number] Date of Birth: 01-Mar-1951 Referring Provider:     Cardiac Rehab from 07/30/2019 in [facility] Cardiac and Pulmonary Rehab  Referring Provider Neoma Laming MD      Encounter Date: 10/28/2019  Check In:  Session Check In - 10/28/19 1135      Check-In   Supervising physician immediately available to respond to emergencies See telemetry face sheet for immediately available ER MD    Location [facility]-Cardiac & Pulmonary Rehab    Staff Present Heath Lark, RN, BSN, CCRP;Meredith Sherryll Burger, RN BSN;Joseph Foy [place], [place], ACSM CEP, Exercise Physiologist    Virtual Visit No    Medication changes reported     No    Fall or balance concerns reported    No    Warm-up and Cool-down Performed on first and last piece of equipment    Resistance Training Performed Yes    VAD Patient? No    PAD/SET Patient? No      Pain Assessment   Currently in Pain? No/denies              Social History   Tobacco Use  Smoking Status Never Smoker  Smokeless Tobacco Never Used    Goals Met:  Independence with exercise equipment Exercise tolerated well No report of cardiac concerns or symptoms  Goals Unmet:  Not Applicable  Comments: Pt able to follow exercise prescription today without complaint.  Will continue to monitor for progression.    Dr. Emily Filbert is Medical Director for [place] and [facility].

## 2019-10-30 ENCOUNTER — Encounter: Payer: 59 | Admitting: *Deleted

## 2019-10-30 ENCOUNTER — Other Ambulatory Visit: Payer: Self-pay

## 2019-10-30 DIAGNOSIS — I252 Old myocardial infarction: Secondary | ICD-10-CM | POA: Diagnosis not present

## 2019-10-30 DIAGNOSIS — I214 Non-ST elevation (NSTEMI) myocardial infarction: Secondary | ICD-10-CM

## 2019-10-30 NOTE — Progress Notes (Signed)
Daily Session Note  Patient Details  Name: Cheyenne Lopez MRN: [id number] Date of Birth: 03-12-1951 Referring Provider:     Cardiac Rehab from 07/30/2019 in [facility] Cardiac and Pulmonary Rehab  Referring Provider Neoma Laming MD      Encounter Date: 10/30/2019  Check In:  Session Check In - 10/30/19 0958      Check-In   Supervising physician immediately available to respond to emergencies See telemetry face sheet for immediately available ER MD    Location [facility]-Cardiac & Pulmonary Rehab    Staff Present Renita Papa, RN BSN;Joseph Lou [place], [place] Exercise Physiologist    Virtual Visit No    Medication changes reported     No    Fall or balance concerns reported    No    Warm-up and Cool-down Performed on first and last piece of equipment    Resistance Training Performed Yes    VAD Patient? No    PAD/SET Patient? No      Pain Assessment   Currently in Pain? No/denies              Social History   Tobacco Use  Smoking Status Never Smoker  Smokeless Tobacco Never Used    Goals Met:  Independence with exercise equipment Exercise tolerated well No report of cardiac concerns or symptoms Strength training completed today  Goals Unmet:  Not Applicable  Comments: Pt able to follow exercise prescription today without complaint.  Will continue to monitor for progression.    Dr. Emily Filbert is Medical Director for [place] and [facility].

## 2019-11-04 ENCOUNTER — Encounter: Payer: Self-pay | Admitting: *Deleted

## 2019-11-04 DIAGNOSIS — I214 Non-ST elevation (NSTEMI) myocardial infarction: Secondary | ICD-10-CM

## 2019-11-04 NOTE — Progress Notes (Signed)
Cardiac Individual Treatment Plan  Patient Details  Name: Cheyenne Lopez MRN: [id number] Date of Birth: 1950/07/06 Referring Provider:     Cardiac Rehab from 07/30/2019 in [facility] Cardiac and Pulmonary Rehab  Referring Provider Neoma Laming MD      Initial Encounter Date:    Cardiac Rehab from 07/30/2019 in [facility] Cardiac and Pulmonary Rehab  Date 07/30/19      Visit Diagnosis: NSTEMI (non-ST elevated myocardial infarction) [facility])  Patient's Home Medications on Admission:  Current Outpatient Medications:  .  ascorbic acid (VITAMIN C) 100 MG tablet, Take 100 mg by mouth daily., Disp: , Rfl:  .  aspirin 81 MG chewable tablet, Chew 1 tablet (81 mg total) by mouth daily., Disp: 30 tablet, Rfl: 0 .  atorvastatin (LIPITOR) 80 MG tablet, Take 1 tablet (80 mg total) by mouth daily., Disp: 90 tablet, Rfl: 0 .  Biotin 10 MG TABS, Take 10 mg by mouth daily., Disp: , Rfl:  .  chlorhexidine (PERIDEX) 0.12 % solution, 30 mLs by Mouth Rinse route See admin instructions. Rinse as needed for mouth sores., Disp: , Rfl:  .  Coenzyme Q10 (CO Q 10 PO), Take 1 capsule by mouth daily., Disp: , Rfl:  .  docusate sodium (COLACE) 100 MG capsule, Take 1 capsule (100 mg total) by mouth daily as needed., Disp: 30 capsule, Rfl: 2 .  insulin detemir (LEVEMIR) 100 UNIT/ML injection, Inject 18 Units into the skin See admin instructions. For use when not using the insulin pump., Disp: , Rfl:  .  Insulin Human (INSULIN PUMP) SOLN, Inject into the skin., Disp: , Rfl:  .  insulin lispro (HUMALOG) 100 UNIT/ML injection, Inject into the skin 3 (three) times daily before meals., Disp: , Rfl:  .  latanoprost (XALATAN) 0.005 % ophthalmic solution, 1 drop at bedtime., Disp: , Rfl:  .  Multiple Vitamin (MULTIVITAMIN) capsule, Take 1 capsule by mouth daily., Disp: , Rfl:  .  nitroGLYCERIN (NITROSTAT) 0.4 MG SL tablet, Place 1 tablet (0.4 mg total) under the tongue every 5 (five) minutes as needed for chest pain., Disp: 9 tablet, Rfl:  12 .  oxyCODONE-acetaminophen (PERCOCET) 5-325 MG tablet, Take 1 tablet by mouth every 8 (eight) hours as needed. (Patient not taking: Reported on 07/29/2019), Disp: 20 tablet, Rfl: 0 .  thyroid (ARMOUR) 65 MG tablet, Take 65 mg by mouth daily., Disp: , Rfl:  .  ticagrelor (BRILINTA) 90 MG TABS tablet, Take 1 tablet (90 mg total) by mouth 2 (two) times daily., Disp: 60 tablet, Rfl: 3 .  Travoprost, BAK Free, (TRAVATAN) 0.004 % SOLN ophthalmic solution, Place 1 drop into both eyes at bedtime., Disp: , Rfl:  .  traZODone (DESYREL) 50 MG tablet, Take 25 mg by mouth at bedtime as needed., Disp: , Rfl:  .  triamcinolone (NASACORT) 55 MCG/ACT AERO nasal inhaler, Place 1 spray into the nose daily as needed., Disp: , Rfl:  .  valsartan (DIOVAN) 40 MG tablet, Take 20 mg by mouth daily. , Disp: , Rfl: 11 .  zinc sulfate (ZINC-220) 220 (50 Zn) MG capsule, Take 220 mg by mouth daily., Disp: , Rfl:   Past Medical History: Past Medical History:  Diagnosis Date  . Hypertension   . Hypothyroid   . Type 1 diabetes (HCC)     Tobacco Use: Social History   Tobacco Use  Smoking Status Never Smoker  Smokeless Tobacco Never Used    Labs: Recent Review Scientist, physiological    Labs for ITP Cardiac and Pulmonary  Rehab Latest Ref Rng & Units 07/16/2019   Cholestrol 0 - 200 mg/dL 135   LDLCALC 0 - 99 mg/dL 79   HDL >40 mg/dL 45   Trlycerides <150 mg/dL 55   Hemoglobin A1c 4.8 - 5.6 % 6.9(H)       Exercise Target Goals: Exercise Program Goal: Individual exercise prescription set using results from initial 6 min walk test and THRR while considering  patient's activity barriers and safety.   Exercise Prescription Goal: Initial exercise prescription builds to 30-45 minutes a day of aerobic activity, 2-3 days per week.  Home exercise guidelines will be given to patient during program as part of exercise prescription that the participant will acknowledge.   Education: Aerobic Exercise & Resistance Training: -  Gives group verbal and written instruction on the various components of exercise. Focuses on aerobic and resistive training programs and the benefits of this training and how to safely progress through these programs..   Education: Exercise & Equipment Safety: - Individual verbal instruction and demonstration of equipment use and safety with use of the equipment.   Cardiac Rehab from 07/29/2019 in [facility] Cardiac and Pulmonary Rehab  Date 07/29/19  Educator [facility]  Instruction Review Code 1- Verbalizes Understanding      Education: Exercise Physiology & General Exercise Guidelines: - Group verbal and written instruction with models to review the exercise physiology of the cardiovascular system and associated critical values. Provides general exercise guidelines with specific guidelines to those with heart or lung disease.    Education: Flexibility, Balance, Mind/Body Relaxation: Provides group verbal/written instruction on the benefits of flexibility and balance training, including mind/body exercise modes such as yoga, pilates and tai chi.  Demonstration and skill practice provided.   Activity Barriers & Risk Stratification:  Activity Barriers & Cardiac Risk Stratification - 07/30/19 1308      Activity Barriers & Cardiac Risk Stratification   Activity Barriers Shortness of Breath;Deconditioning;Muscular Weakness;Other (comment);Balance Concerns    Comments previous vertigo, meds sometimes made it difficult to walk    Cardiac Risk Stratification Moderate           6 Minute Walk:  6 Minute Walk    Row Name 07/30/19 1305         6 Minute Walk   Phase Initial     Distance 1265 feet     Walk Time 6 minutes     # of Rest Breaks 0     MPH 2.4     METS 2.75     RPE 11     VO2 Peak 9.64     Symptoms Yes (comment)     Comments fatigue, r hip pain/dull ache 2-3/10     Resting HR 63 bpm     Resting BP 138/72     Resting Oxygen Saturation  99 %     Exercise Oxygen Saturation  during 6  min walk 96 %     Max Ex. HR 87 bpm     Max Ex. BP 144/74     2 Minute Post BP 128/70            Oxygen Initial Assessment:   Oxygen Re-Evaluation:   Oxygen Discharge (Final Oxygen Re-Evaluation):   Initial Exercise Prescription:  Initial Exercise Prescription - 07/30/19 1300      Date of Initial Exercise RX and Referring Provider   Date 07/30/19    Referring Provider Neoma Laming MD      Treadmill   MPH 2.3  Grade 0    Minutes 15    METs 2.76      Elliptical   Level 1    Speed 2.3    Minutes 15    METs 2      REL-XR   Level 1    Speed 50    Minutes 15    METs 2      Prescription Details   Frequency (times per week) 3    Duration Progress to 30 minutes of continuous aerobic without signs/symptoms of physical distress      Intensity   THRR 40-80% of Max Heartrate 99-134    Ratings of Perceived Exertion 11-13    Perceived Dyspnea 0-4      Progression   Progression Continue to progress workloads to maintain intensity without signs/symptoms of physical distress.      Resistance Training   Training Prescription Yes    Weight 3 lb    Reps 10-15           Perform Capillary Blood Glucose checks as needed.  Exercise Prescription Changes:  Exercise Prescription Changes    Row Name 07/30/19 1300 08/18/19 1600 08/31/19 1600 09/04/19 1100 09/15/19 1400     Response to Exercise   Blood Pressure (Admit) 138/72 122/82 122/56 -- 100/62   Blood Pressure (Exercise) 144/74 162/58 150/70 -- 140/58   Blood Pressure (Exit) 128/70 118/58 112/60 -- 104/60   Heart Rate (Admit) 63 bpm 72 bpm 73 bpm -- 58 bpm   Heart Rate (Exercise) 87 bpm 100 bpm 106 bpm -- 113 bpm   Heart Rate (Exit) 63 bpm 83 bpm 82 bpm -- 73 bpm   Oxygen Saturation (Admit) 99 % -- -- -- --   Oxygen Saturation (Exercise) 96 % -- -- -- --   Rating of Perceived Exertion (Exercise) _0 -- 13   Symptoms fatigue, r hip pain dull ache 2-3/10 none none -- none   Comments walk test results --  -- -- --   Duration -- Continue with 30 min of aerobic exercise without signs/symptoms of physical distress. Continue with 30 min of aerobic exercise without signs/symptoms of physical distress. -- Continue with 30 min of aerobic exercise without signs/symptoms of physical distress.   Intensity -- THRR unchanged THRR unchanged -- THRR unchanged     Progression   Progression -- Continue to progress workloads to maintain intensity without signs/symptoms of physical distress. Continue to progress workloads to maintain intensity without signs/symptoms of physical distress. -- Continue to progress workloads to maintain intensity without signs/symptoms of physical distress.   Average METs -- 3.5 3.51 -- 3     Resistance Training   Training Prescription -- Yes Yes -- Yes   Weight -- 3 lb 3 lb -- 3 lb   Reps -- 10-15 10-15 -- 10-15     Interval Training   Interval Training -- -- No -- No     Treadmill   MPH -- 2.3 2.9 -- 2.7   Grade -- 0.5 0.5 -- 1.5   Minutes -- 15 15 -- 15   METs -- 2.92 3.42 -- 3.63     NuStep   Level -- -- -- -- 3   SPM -- -- -- -- 80   Minutes -- -- -- -- 15   METs -- -- -- -- 2.4     REL-XR   Level -- -- 1 -- --   Minutes -- -- 15 -- --   METs -- -- 5 -- --  T5 Nustep   Level -- 3 2 -- --   SPM -- 80 -- -- --   Minutes -- 15 15 -- --   METs -- 2 2.1 -- --     Home Exercise Plan   Plans to continue exercise at -- -- -- Home (comment)  walking and staff videos --   Frequency -- -- -- Add 2 additional days to program exercise sessions. --   Initial Home Exercises Provided -- -- -- 09/04/19 --   [place] Name 09/29/19 1400 10/28/19 1100           Response to Exercise   Blood Pressure (Admit) 102/58 126/60      Blood Pressure (Exercise) 126/64 128/70      Blood Pressure (Exit) 104/56 122/62      Heart Rate (Admit) 89 bpm 58 bpm      Heart Rate (Exercise) 94 bpm 81 bpm      Heart Rate (Exit) 75 bpm 63 bpm      Rating of Perceived Exertion (Exercise) 12 13       Symptoms none none      Duration Continue with 30 min of aerobic exercise without signs/symptoms of physical distress. Progress to 30 minutes of  aerobic without signs/symptoms of physical distress      Intensity THRR unchanged THRR unchanged        Progression   Progression Continue to progress workloads to maintain intensity without signs/symptoms of physical distress. Continue to progress workloads to maintain intensity without signs/symptoms of physical distress.      Average METs 3.04 2.81        Resistance Training   Training Prescription Yes Yes      Weight 3 lb 3 lb      Reps 10-15 10-15        Interval Training   Interval Training No No        Treadmill   MPH 2.9 2.8      Grade 2.5 1.5      Minutes 15 15      METs 4.22 3.72        REL-XR   Level 3 --      Minutes 15 --      METs 2.9 --        T5 Nustep   Level 3 3      Minutes 15 15      METs 2 1.9        Home Exercise Plan   Plans to continue exercise at Home (comment)  walking and staff videos Home (comment)  walking and staff videos      Frequency Add 2 additional days to program exercise sessions. Add 2 additional days to program exercise sessions.      Initial Home Exercises Provided 09/04/19 09/04/19             Exercise Comments:   Exercise Goals and Review:  Exercise Goals    Row Name 07/30/19 1312             Exercise Goals   Increase Physical Activity Yes       Intervention Provide advice, education, support and counseling about physical activity/exercise needs.;Develop an individualized exercise prescription for aerobic and resistive training based on initial evaluation findings, risk stratification, comorbidities and participant's personal goals.       Expected Outcomes Short Term: Attend rehab on a regular basis to increase amount of physical activity.;Long Term: Add in home exercise to make exercise  part of routine and to increase amount of physical activity.;Long Term: Exercising  regularly at least 3-5 days a week.       Increase Strength and Stamina Yes       Intervention Provide advice, education, support and counseling about physical activity/exercise needs.;Develop an individualized exercise prescription for aerobic and resistive training based on initial evaluation findings, risk stratification, comorbidities and participant's personal goals.       Expected Outcomes Short Term: Increase workloads from initial exercise prescription for resistance, speed, and METs.;Short Term: Perform resistance training exercises routinely during rehab and add in resistance training at home;Long Term: Improve cardiorespiratory fitness, muscular endurance and strength as measured by increased METs and functional capacity ( )       Able to understand and use rate of perceived exertion (RPE) scale Yes       Intervention Provide education and explanation on how to use RPE scale       Expected Outcomes Short Term: Able to use RPE daily in rehab to express subjective intensity level;Long Term:  Able to use RPE to guide intensity level when exercising independently       Able to understand and use Dyspnea scale Yes       Intervention Provide education and explanation on how to use Dyspnea scale       Expected Outcomes Short Term: Able to use Dyspnea scale daily in rehab to express subjective sense of shortness of breath during exertion;Long Term: Able to use Dyspnea scale to guide intensity level when exercising independently       Knowledge and understanding of Target Heart Rate Range (THRR) Yes       Intervention Provide education and explanation of THRR including how the numbers were predicted and where they are located for reference       Expected Outcomes Short Term: Able to state/look up THRR;Long Term: Able to use THRR to govern intensity when exercising independently;Short Term: Able to use daily as guideline for intensity in rehab       Able to check pulse independently Yes        Intervention Provide education and demonstration on how to check pulse in carotid and radial arteries.;Review the importance of being able to check your own pulse for safety during independent exercise       Expected Outcomes Short Term: Able to explain why pulse checking is important during independent exercise;Long Term: Able to check pulse independently and accurately       Understanding of Exercise Prescription Yes       Intervention Provide education, explanation, and written materials on patient's individual exercise prescription       Expected Outcomes Short Term: Able to explain program exercise prescription;Long Term: Able to explain home exercise prescription to exercise independently              Exercise Goals Re-Evaluation :  Exercise Goals Re-Evaluation    Row Name 08/05/19 1023 08/18/19 1607 08/31/19 1603 09/04/19 1040 09/14/19 1025     Exercise Goal Re-Evaluation   Exercise Goals Review Increase Physical Activity;Able to understand and use rate of perceived exertion (RPE) scale;Knowledge and understanding of Target Heart Rate Range (THRR);Increase Strength and Stamina;Able to check pulse independently Increase Physical Activity;Increase Strength and Stamina;Able to understand and use rate of perceived exertion (RPE) scale;Able to understand and use Dyspnea scale;Knowledge and understanding of Target Heart Rate Range (THRR);Able to check pulse independently;Understanding of Exercise Prescription Increase Physical Activity;Increase Strength and Stamina;Understanding of Exercise Prescription Increase Physical  Activity;Increase Strength and Stamina;Understanding of Exercise Prescription Increase Physical Activity;Increase Strength and Stamina;Understanding of Exercise Prescription   Comments Reviewed RPE and dyspnea scales, THR and program prescription with pt today.  Pt voiced understanding and was given a copy of goals to take home. Cheyenne Lopez is doing well in rehab.  She has increased  incline on TM.  Staff willmonitor progress. Cheyenne Lopez continues to do well in rehab.  She is now up to 5 METs on the XR. We will continue to montior her progress. Cheyenne Lopez is doing well in rehab.  She is getting in some already for 25 min of walking.  We talked about increasing to 30 min.  Reviewed home exercise with pt today.  Pt plans to continue walking at home for exercise.  She is to increase her time to 30 min.  Reviewed THR, pulse, RPE, sign and symptoms, pulse oximetery and when to call 911 or MD.  Also discussed weather considerations and indoor options.  Pt voiced understanding. Cheyenne Lopez does walk at home - she gets a litle out of breath when walking uphill.   She will try to use staff videos when its too hot to walk outside.   Expected Outcomes Short: Use RPE daily to regulate intensity. Long: Follow program prescription in THR. Short:  attend consistently Long: increase overall stamina Short: Increase workloads Long: Continue to improve stamina. Short: Increase exercise time at home to 30 min.  Long; Continue to improve stamina. Short: continue to walk at home Long: improve stamina overall   Row Name 09/29/19 1421 10/26/19 1023           Exercise Goal Re-Evaluation   Exercise Goals Review Increase Physical Activity;Increase Strength and Stamina;Understanding of Exercise Prescription Increase Physical Activity;Increase Strength and Stamina;Understanding of Exercise Prescription      Comments Cheyenne Lopez has been out with pain since 09/18/19.  She is working with her doctor.  We will continue to monitor her progress. Cheyenne Lopez had some soreness after her first session back on 10/23/19.  We reviewed normal muscle soreness patterns and trying a stretching video at home to help alleviate soreness.      Expected Outcomes Short: Recover from pain  Long; Conitnue to improve stamina. Short: get back to regular attendance Long:  improve stamina and reduce muscle pain             Discharge Exercise Prescription (Final  Exercise Prescription Changes):  Exercise Prescription Changes - 10/28/19 1100      Response to Exercise   Blood Pressure (Admit) 126/60    Blood Pressure (Exercise) 128/70    Blood Pressure (Exit) 122/62    Heart Rate (Admit) 58 bpm    Heart Rate (Exercise) 81 bpm    Heart Rate (Exit) 63 bpm    Rating of Perceived Exertion (Exercise) 13    Symptoms none    Duration Progress to 30 minutes of  aerobic without signs/symptoms of physical distress    Intensity THRR unchanged      Progression   Progression Continue to progress workloads to maintain intensity without signs/symptoms of physical distress.    Average METs 2.81      Resistance Training   Training Prescription Yes    Weight 3 lb    Reps 10-15      Interval Training   Interval Training No      Treadmill   MPH 2.8    Grade 1.5    Minutes 15    METs 3.72  T5 Nustep   Level 3    Minutes 15    METs 1.9      Home Exercise Plan   Plans to continue exercise at Home (comment)   walking and staff videos   Frequency Add 2 additional days to program exercise sessions.    Initial Home Exercises Provided 09/04/19           Nutrition:  Target Goals: Understanding of nutrition guidelines, daily intake of sodium <1565m, cholesterol <2030m calories 30% from fat and 7% or less from saturated fats, daily to have 5 or more servings of fruits and vegetables.  Education: Controlling Sodium/Reading Food Labels -Group verbal and written material supporting the discussion of sodium use in heart healthy nutrition. Review and explanation with models, verbal and written materials for utilization of the food label.   Education: General Nutrition Guidelines/Fats and Fiber: -Group instruction provided by verbal, written material, models and posters to present the general guidelines for heart healthy nutrition. Gives an explanation and review of dietary fats and fiber.   Biometrics:  Pre Biometrics - 07/30/19 1313      Pre  Biometrics   Height 5' 0.75" (1.543 m)    Weight 146 lb 14.4 oz (66.6 kg)    BMI (Calculated) 27.99    Single Leg Stand 13.6 seconds            Nutrition Therapy Plan and Nutrition Goals:  Nutrition Therapy & Goals - 08/17/19 1024      Nutrition Therapy   Diet Low Na, heart healthy, diabetes friendly    Protein (specify units) 55-60g    Fiber 25 grams    Whole Grain Foods 3 servings    Saturated Fats 12 max. grams    Fruits and Vegetables 5 servings/day    Sodium 1.5 grams      Personal Nutrition Goals   Nutrition Goal ST: add in fats to meals, add cannellini beans to avocado toast to add protein, indentify high Na sources in diet LT: Wants to get back to gardening and other activities she enjoys    Comments T1DM 47 Years in July. A1c 6.9. B: coffee (stevia)(oatmilk, almond), cereal (oatmeal and cheerios - oatmilk) L: Salad  and chicken D: salad (kits - vinegarette) and chicken or fish or couscous or bulgur wheat, beans. Hunger levels normal throughout the day due to lower appetite. Pt uses olive oil. Discussed heart healthy eating, balance, variety, and diabetes.      Intervention Plan   Intervention Prescribe, educate and counsel regarding individualized specific dietary modifications aiming towards targeted core components such as weight, hypertension, lipid management, diabetes, heart failure and other comorbidities.;Nutrition handout(s) given to patient.    Expected Outcomes Short Term Goal: Understand basic principles of dietary content, such as calories, fat, sodium, cholesterol and nutrients.;Short Term Goal: A plan has been developed with personal nutrition goals set during dietitian appointment.;Long Term Goal: Adherence to prescribed nutrition plan.   Low sodium handout and flavor handout in addition to general guidelines.          Nutrition Assessments:  Nutrition Assessments - 07/30/19 1314      MEDFICTS Scores   Pre Score 13           MEDIFICTS Score Key:           ?70 Need to make dietary changes          40-70 Heart Healthy Diet         ? 40 Therapeutic Level Cholesterol Diet  Nutrition Goals Re-Evaluation:  Nutrition Goals Re-Evaluation    Cheyenne Lopez Name 09/14/19 1023             Goals   Nutrition Goal Cheyenne Lopez has added beans to toast for protein.  She has been watching sodium.  We discussed Cheyenne Lopez or other no sodium options.       Expected Outcome Short: continue to watch sodium Long: maintain healthy diet to manage DM and heart health              Nutrition Goals Discharge (Final Nutrition Goals Re-Evaluation):  Nutrition Goals Re-Evaluation - 09/14/19 1023      Goals   Nutrition Goal Cheyenne Lopez has added beans to toast for protein.  She has been watching sodium.  We discussed Cheyenne Lopez or other no sodium options.    Expected Outcome Short: continue to watch sodium Long: maintain healthy diet to manage DM and heart health           Psychosocial: Target Goals: Acknowledge presence or absence of significant depression and/or stress, maximize coping skills, provide positive support system. Participant is able to verbalize types and ability to use techniques and skills needed for reducing stress and depression.   Education: Depression - Provides group verbal and written instruction on the correlation between heart/lung disease and depressed mood, treatment options, and the stigmas associated with seeking treatment.   Education: Sleep Hygiene -Provides group verbal and written instruction about how sleep can affect your health.  Define sleep hygiene, discuss sleep cycles and impact of sleep habits. Review good sleep hygiene tips.     Education: Stress and Anxiety: - Provides group verbal and written instruction about the health risks of elevated stress and causes of high stress.  Discuss the correlation between heart/lung disease and anxiety and treatment options. Review healthy ways to manage with stress and anxiety.    Initial Review  & Psychosocial Screening:  Initial Psych Review & Screening - 07/29/19 1336      Initial Review   Current issues with None Identified      Family Dynamics   Good Support System? Yes    Comments She can look  to her husband, son and borther inlaw for support. She has a positive outlook on her health.      Barriers   Psychosocial barriers to participate in program There are no identifiable barriers or psychosocial needs.      Screening Interventions   Interventions Encouraged to exercise;Provide feedback about the scores to participant;To provide support and resources with identified psychosocial needs    Expected Outcomes Short Term goal: Utilizing psychosocial counselor, staff and physician to assist with identification of specific Stressors or current issues interfering with healing process. Setting desired goal for each stressor or current issue identified.;Short Term goal: Identification and review with participant of any Quality of Life or Depression concerns found by scoring the questionnaire.;Long Term Goal: Stressors or current issues are controlled or eliminated.;Long Term goal: The participant improves quality of Life and PHQ9 Scores as seen by post scores and/or verbalization of changes           Quality of Life Scores:   Quality of Life - 07/30/19 1313      Quality of Life   Select Quality of Life      Quality of Life Scores   Health/Function Pre 24.46 %    Socioeconomic Pre 27 %    Psych/Spiritual Pre 26.57 %    Family Pre 26.4 %  GLOBAL Pre 25.74 %          Scores of 19 and below usually indicate a poorer quality of life in these areas.  A difference of  2-3 points is a clinically meaningful difference.  A difference of 2-3 points in the total score of the Quality of Life Index has been associated with significant improvement in overall quality of life, self-image, physical symptoms, and general health in studies assessing change in quality of  life.  PHQ-9: Recent Review Flowsheet Data    Depression screen [facility] 2/9 08/14/2019 07/30/2019   Decreased Interest 0 0   Down, Depressed, Hopeless 0 0   PHQ - 2 Score 0 0   Altered sleeping 0 1   Tired, decreased energy 0 3   Change in appetite 0 0   Feeling bad or failure about yourself  0 0   Trouble concentrating 0 1   Moving slowly or fidgety/restless 0 0   Suicidal thoughts 0 0   PHQ-9 Score 0 5   Difficult doing work/chores - Somewhat difficult     Interpretation of Total Score  Total Score Depression Severity:  1-4 = Minimal depression, 5-9 = Mild depression, 10-14 = Moderate depression, 15-19 = Moderately severe depression, 20-27 = Severe depression   Psychosocial Evaluation and Intervention:  Psychosocial Evaluation - 07/29/19 1401      Psychosocial Evaluation & Interventions   Interventions Encouraged to exercise with the program and follow exercise prescription    Comments Patient has a positive outlook on her health and states no depression issues.    Expected Outcomes Short: Attend HeartTrack stress management education to decrease stress. Long: Maintain exercise Post HeartTrack to keep stress at a minimum.    Continue Psychosocial Services  Follow up required by staff           Psychosocial Re-Evaluation:  Psychosocial Re-Evaluation    [place] Name 09/04/19 1043 09/14/19 1021 10/26/19 1019         Psychosocial Re-Evaluation   Current issues with Current Stress Concerns;Current Sleep Concerns -- Current Stress Concerns;Current Sleep Concerns     Comments Cheyenne Lopez is doing well, she does admit to having some down days, but overall is good.  She denies major stressors in life.  Her biggest concern is waking up at 4am daily.  She will start to play games or read to help decompress for a little bit.  Overall, she is able to get about 6 hours of sleep.  We talked about reviewing the sleep videos online.  They are also getting their house ready to move. Cheyenne Lopez is still waking up  at 3:30-4 am.  She doesnt feel rested.  She does read or play games and does get sleepy again in a couple hours but has to get up for the day. Cheyenne Lopez is feeling better mentally since she has answers about why she had muscle pain.  She is still waking up at 3:30 am and up til 5 am then sleeps again until 7.  She has spoken with her Dr and has Trazodone that she takes a half pill as directed.     Expected Outcomes Short: Watch sleep videos Long: Continue to stay positive. Short: watch videos Long: develop better sleep patterns Short:  follow up with Dr about sleep Long: develop better sleep patterns     Interventions Stress management education;Encouraged to attend Cardiac Rehabilitation for the exercise -- --     Continue Psychosocial Services  Follow up required by staff -- --  Psychosocial Discharge (Final Psychosocial Re-Evaluation):  Psychosocial Re-Evaluation - 10/26/19 1019      Psychosocial Re-Evaluation   Current issues with Current Stress Concerns;Current Sleep Concerns    Comments Cheyenne Lopez is feeling better mentally since she has answers about why she had muscle pain.  She is still waking up at 3:30 am and up til 5 am then sleeps again until 7.  She has spoken with her Dr and has Trazodone that she takes a half pill as directed.    Expected Outcomes Short:  follow up with Dr about sleep Long: develop better sleep patterns           Vocational Rehabilitation: Provide vocational rehab assistance to qualifying candidates.   Vocational Rehab Evaluation & Intervention:   Education: Education Goals: Education classes will be provided on a variety of topics geared toward better understanding of heart health and risk factor modification. Participant will state understanding/return demonstration of topics presented as noted by education test scores.  Learning Barriers/Preferences:  Learning Barriers/Preferences - 07/29/19 1338      Learning Barriers/Preferences   Learning  Barriers None    Learning Preferences None           General Cardiac Education Topics:  AED/CPR: - Group verbal and written instruction with the use of models to demonstrate the basic use of the AED with the basic ABC's of resuscitation.   Anatomy & Physiology of the Heart: - Group verbal and written instruction and models provide basic cardiac anatomy and physiology, with the coronary electrical and arterial systems. Review of Valvular disease and Heart Failure   Cardiac Procedures: - Group verbal and written instruction to review commonly prescribed medications for heart disease. Reviews the medication, class of the drug, and side effects. Includes the steps to properly store meds and maintain the prescription regimen. (beta blockers and nitrates)   Cardiac Medications I: - Group verbal and written instruction to review commonly prescribed medications for heart disease. Reviews the medication, class of the drug, and side effects. Includes the steps to properly store meds and maintain the prescription regimen.   Cardiac Medications II: -Group verbal and written instruction to review commonly prescribed medications for heart disease. Reviews the medication, class of the drug, and side effects. (all other drug classes)    Go Sex-Intimacy & Heart Disease, Get SMART - Goal Setting: - Group verbal and written instruction through game format to discuss heart disease and the return to sexual intimacy. Provides group verbal and written material to discuss and apply goal setting through the application of the S.M.A.R.T. Method.   Other Matters of the Heart: - Provides group verbal, written materials and models to describe Stable Angina and Peripheral Artery. Includes description of the disease process and treatment options available to the cardiac patient.   Infection Prevention: - Provides verbal and written material to individual with discussion of infection control including proper  hand washing and proper equipment cleaning during exercise session.   Cardiac Rehab from 07/29/2019 in [facility] Cardiac and Pulmonary Rehab  Date 07/29/19  Educator [facility]  Instruction Review Code 1- Verbalizes Understanding      Falls Prevention: - Provides verbal and written material to individual with discussion of falls prevention and safety.   Cardiac Rehab from 07/29/2019 in [facility] Cardiac and Pulmonary Rehab  Date 07/29/19  Educator [facility]  Instruction Review Code 1- Verbalizes Understanding      Other: -Provides group and verbal instruction on various topics (see comments)   Knowledge Questionnaire Score:  Knowledge Questionnaire Score - 07/30/19 1314      Knowledge Questionnaire Score   Pre Score 23/26 Education Focus: exercise           Core Components/Risk Factors/Patient Goals at Admission:  Personal Goals and Risk Factors at Admission - 07/30/19 1314      Core Components/Risk Factors/Patient Goals on Admission    Weight Management Yes;Weight Loss    Intervention Weight Management: Develop a combined nutrition and exercise program designed to reach desired caloric intake, while maintaining appropriate intake of nutrient and fiber, sodium and fats, and appropriate energy expenditure required for the weight goal.;Weight Management: Provide education and appropriate resources to help participant work on and attain dietary goals.    Admit Weight 146 lb 14.4 oz (66.6 kg)    Goal Weight: Short Term 141 lb (64 kg)    Goal Weight: Long Term 135 lb (61.2 kg)    Expected Outcomes Short Term: Continue to assess and modify interventions until short term weight is achieved;Weight Loss: Understanding of general recommendations for a balanced deficit meal plan, which promotes 1-2 lb weight loss per week and includes a negative energy balance of [phone] kcal/d;Understanding recommendations for meals to include 15-35% energy as protein, 25-35% energy from fat, 35-60% energy from carbohydrates, less  than 253m of dietary cholesterol, 20-35 gm of total fiber daily;Understanding of distribution of calorie intake throughout the day with the consumption of 4-5 meals/snacks    Diabetes Yes    Intervention Provide education about signs/symptoms and action to take for hypo/hyperglycemia.;Provide education about proper nutrition, including hydration, and aerobic/resistive exercise prescription along with prescribed medications to achieve blood glucose in normal ranges: Fasting glucose 65-99 mg/dL    Expected Outcomes Short Term: Participant verbalizes understanding of the signs/symptoms and immediate care of hyper/hypoglycemia, proper foot care and importance of medication, aerobic/resistive exercise and nutrition plan for blood glucose control.;Long Term: Attainment of HbA1C < 7%.    Hypertension Yes    Intervention Provide education on lifestyle modifcations including regular physical activity/exercise, weight management, moderate sodium restriction and increased consumption of fresh fruit, vegetables, and low fat dairy, alcohol moderation, and smoking cessation.;Monitor prescription use compliance.    Expected Outcomes Short Term: Continued assessment and intervention until BP is < 140/930mHG in hypertensive participants. < 130/8052mG in hypertensive participants with diabetes, heart failure or chronic kidney disease.;Long Term: Maintenance of blood pressure at goal levels.    Lipids Yes    Intervention Provide education and support for participant on nutrition & aerobic/resistive exercise along with prescribed medications to achieve LDL <56m89mDL >40mg64m Expected Outcomes Short Term: Participant states understanding of desired cholesterol values and is compliant with medications prescribed. Participant is following exercise prescription and nutrition guidelines.;Long Term: Cholesterol controlled with medications as prescribed, with individualized exercise RX and with personalized nutrition plan. Value  goals: LDL < 56mg,61m > 40 mg.           Education:Diabetes - Individual verbal and written instruction to review signs/symptoms of diabetes, desired ranges of glucose level fasting, after meals and with exercise. Acknowledge that pre and post exercise glucose checks will be done for 3 sessions at entry of program.   [facility]  Date 07/29/19  [facility]- Verbal[facility] Understanding      Education: Know Your Numbers and Risk Factors: -Group verbal and written instruction about important numbers in your health.  Discussion of what  are risk factors and how they play a role in the disease process.  Review of Cholesterol, Blood Pressure, Diabetes, and BMI and the role they play in your overall health.   Core Components/Risk Factors/Patient Goals Review:   Goals and Risk Factor Review    Row Name 09/04/19 1046 09/14/19 1019 10/26/19 1010         Core Components/Risk Factors/Patient Goals Review   Personal Goals Review Weight Management/Obesity;Hypertension;Diabetes;Lipids Weight Management/Obesity;Hypertension;Diabetes;Lipids Weight Management/Obesity;Hypertension;Diabetes;Lipids     Review Cheyenne Lopez is doing well in rehab, she has already lost a couple of pounds.  Her blood sugars have been erratic and stress will trigger a reaction and we talked about how exercise can help.  Blood pressures have been good overall in class. She does not check it at home as things are scattered getting ready to move. Cheyenne Lopez monitors her BG at home.  They still have been erratic - she sees Dr. in September.  Advised her to call if needed before then. Cheyenne Lopez says her BG has been more steady.  She has missed several weeks due to muscle pain and stopped taking statin.  She has spoken with her Dr about stopping it and sees cardiologist in September. 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  She was a little sore  after working out Friday - first time back.  She has a laser eye procedure Monday and will get clearance to return.     Expected Outcomes Short: Get sugars under better control. Long Continue to monitor risk factors. Short: follow up with Dr about sugars Long: manage risk factors Short:  continue to follow up with Dr about meds and symptoms  Long:  manage risk factors            Core Components/Risk Factors/Patient Goals at Discharge (Final Review):   Goals and Risk Factor Review - 10/26/19 1010      Core Components/Risk Factors/Patient Goals Review   Personal Goals Review Weight Management/Obesity;Hypertension;Diabetes;Lipids    Review Cheyenne Lopez says her BG has been more steady.  She has missed several weeks due to muscle pain and stopped taking statin.  She has spoken with her Dr about stopping it and sees cardiologist in September. 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  She was a little sore after working out Friday - first time back.  She has a laser eye procedure Monday and will get clearance to return.    Expected Outcomes Short:  continue to follow up with Dr about meds and symptoms  Long:  manage risk factors           ITP Comments:  ITP Comments    Row Name 07/29/19 1344 07/30/19 1304 08/05/19 1022 08/12/19 0744 09/09/19 0645   ITP Comments Virtual Visit Completed. Patient informed of EP and RD schedule. Visit diagnosis can be found in [facility] 07/14/2019 Completed 6MWT and gym orientation.  Initial ITP created and sent for review to Dr. Emily Filbert, Medical Director. First full day of exercise!  Patient was oriented to gym and equipment including functions, settings, policies, and procedures.  Patient's individual exercise prescription and treatment plan were reviewed.  All starting workloads were established based on the results of the 6 minute walk test done at initial orientation visit.  The plan for exercise progression was also introduced and progression  will be customized based on patient's performance and goals 30 Day review completed. ITP review done, changes made as directed,and approval shown by signature of program Medical  Director.  New to program 30 Day review completed. Medical Director ITP review done, changes made as directed, and signed approval by Medical Director.   [place] Name 09/23/19 0902 09/29/19 1421 10/07/19 1109 10/13/19 1327 11/04/19 0732   ITP Comments Patient called stating that she has been in pain (legs and lower back) since last week and is not comfortable coming to rehab this week. Encouraged to call doctor to discuss further evaluation Dennisha called to say she wasn't here today due to continued pain and foot swelling.  She saw a chiropractor and a rheumatologist who tested for for myalgia.  Her CRP is elevated.  She also contacted her cardiologist about Lipitor as she had a reaction Ireanna called to say she wasn't here today due to continued pain and foot swelling.  She saw a chiropractor and a rheumatologist who tested for for myalgia.  Her CRP is elevated.  She also contacted her cardiologist about Lipitor as she had a reaction to Crestor with similar side effects.  She stopped taking it on her own.She will follow up once she hears from her Dr.to Crestor with similar side effects.  She stopped taking it on her own.She will follow up once she hears from her Dr. 42 Day review completed. Medical Director ITP review done, changes made as directed, and signed approval by Medical Director. Brinleigh has not attended since last review 30 Day review completed. Medical Director ITP review done, changes made as directed, and signed approval by Medical Director.          Comments:

## 2019-11-11 ENCOUNTER — Telehealth: Payer: Self-pay

## 2019-11-11 NOTE — Telephone Encounter (Signed)
LMOM

## 2019-11-18 ENCOUNTER — Encounter: Payer: Self-pay | Admitting: *Deleted

## 2019-11-18 DIAGNOSIS — I214 Non-ST elevation (NSTEMI) myocardial infarction: Secondary | ICD-10-CM

## 2019-11-18 NOTE — Progress Notes (Signed)
Discharge Progress Report  Patient Details  Name: Cheyenne Lopez MRN: [id number] Date of Birth: 1950/04/12 Referring Provider:     Cardiac Rehab from 07/30/2019 in [facility] Cardiac and Pulmonary Rehab  Referring Provider Neoma Laming MD       Number of Visits: 21  Reason for Discharge:  Early Exit:  Lack of attendance  Smoking History:  Social History   Tobacco Use  Smoking Status Never Smoker  Smokeless Tobacco Never Used    Diagnosis:  NSTEMI (non-ST elevated myocardial infarction) ([place])  ADL [facility]:   Initial Exercise Prescription:  Initial Exercise Prescription - 07/30/19 1300      Date of Initial Exercise RX and Referring Provider   Date 07/30/19    Referring Provider Neoma Laming MD      Treadmill   MPH 2.3    Grade 0    Minutes 15    METs 2.76      Elliptical   Level 1    Speed 2.3    Minutes 15    METs 2      REL-XR   Level 1    Speed 50    Minutes 15    METs 2      Prescription Details   Frequency (times per week) 3    Duration Progress to 30 minutes of continuous aerobic without signs/symptoms of physical distress      Intensity   THRR 40-80% of Max Heartrate 99-134    Ratings of Perceived Exertion 11-13    Perceived Dyspnea 0-4      Progression   Progression Continue to progress workloads to maintain intensity without signs/symptoms of physical distress.      Resistance Training   Training Prescription Yes    Weight 3 lb    Reps 10-15           Discharge Exercise Prescription (Final Exercise Prescription Changes):  Exercise Prescription Changes - 10/28/19 1100      Response to Exercise   Blood Pressure (Admit) 126/60    Blood Pressure (Exercise) 128/70    Blood Pressure (Exit) 122/62    Heart Rate (Admit) 58 bpm    Heart Rate (Exercise) 81 bpm    Heart Rate (Exit) 63 bpm    Rating of Perceived Exertion (Exercise) 13    Symptoms none    Duration Progress to 30 minutes of  aerobic without signs/symptoms of physical distress     Intensity THRR unchanged      Progression   Progression Continue to progress workloads to maintain intensity without signs/symptoms of physical distress.    Average METs 2.81      Resistance Training   Training Prescription Yes    Weight 3 lb    Reps 10-15      Interval Training   Interval Training No      Treadmill   MPH 2.8    Grade 1.5    Minutes 15    METs 3.72      T5 Nustep   Level 3    Minutes 15    METs 1.9      Home Exercise Plan   Plans to continue exercise at Home (comment)   walking and staff videos   Frequency Add 2 additional days to program exercise sessions.    Initial Home Exercises Provided 09/04/19           Functional Capacity:  6 Minute Walk    Row Name 07/30/19 1305  6 Minute Walk   Phase Initial     Distance 1265 feet     Walk Time 6 minutes     # of Rest Breaks 0     MPH 2.4     METS 2.75     RPE 11     VO2 Peak 9.64     Symptoms Yes (comment)     Comments fatigue, r hip pain/dull ache 2-3/10     Resting HR 63 bpm     Resting BP 138/72     Resting Oxygen Saturation  99 %     Exercise Oxygen Saturation  during 6 min walk 96 %     Max Ex. HR 87 bpm     Max Ex. BP 144/74     2 Minute Post BP 128/70            Psychological, QOL, Others - Outcomes: PHQ 2/9: Depression screen [facility] 2/9 08/14/2019 07/30/2019  Decreased Interest 0 0  Down, Depressed, Hopeless 0 0  PHQ - 2 Score 0 0  Altered sleeping 0 1  Tired, decreased energy 0 3  Change in appetite 0 0  Feeling bad or failure about yourself  0 0  Trouble concentrating 0 1  Moving slowly or fidgety/restless 0 0  Suicidal thoughts 0 0  PHQ-9 Score 0 5  Difficult doing work/chores - Somewhat difficult    Quality of Life:  Quality of Life - 07/30/19 1313      Quality of Life   Select Quality of Life      Quality of Life Scores   Health/Function Pre 24.46 %    Socioeconomic Pre 27 %    Psych/Spiritual Pre 26.57 %    Family Pre 26.4 %    GLOBAL Pre 25.74 %                Nutrition & Weight - Outcomes:  Pre Biometrics - 07/30/19 1313      Pre Biometrics   Height 5' 0.75" (1.543 m)    Weight 146 lb 14.4 oz (66.6 kg)    BMI (Calculated) 27.99    Single Leg Stand 13.6 seconds            Nutrition:  Nutrition Therapy & Goals - 08/17/19 1024      Nutrition Therapy   Diet Low Na, heart healthy, diabetes friendly    Protein (specify units) 55-60g    Fiber 25 grams    Whole Grain Foods 3 servings    Saturated Fats 12 max. grams    Fruits and Vegetables 5 servings/day    Sodium 1.5 grams      Personal Nutrition Goals   Nutrition Goal ST: add in fats to meals, add cannellini beans to avocado toast to add protein, indentify high Na sources in diet LT: Wants to get back to gardening and other activities she enjoys    Comments T1DM 47 Years in July. A1c 6.9. B: coffee (stevia)(oatmilk, almond), cereal (oatmeal and cheerios - oatmilk) L: Salad  and chicken D: salad (kits - vinegarette) and chicken or fish or couscous or bulgur wheat, beans. Hunger levels normal throughout the day due to lower appetite. Pt uses olive oil. Discussed heart healthy eating, balance, variety, and diabetes.      Intervention Plan   Intervention Prescribe, educate and counsel regarding individualized specific dietary modifications aiming towards targeted core components such as weight, hypertension, lipid management, diabetes, heart failure and other comorbidities.;Nutrition handout(s) given to patient.  Expected Outcomes Short Term Goal: Understand basic principles of dietary content, such as calories, fat, sodium, cholesterol and nutrients.;Short Term Goal: A plan has been developed with personal nutrition goals set during dietitian appointment.;Long Term Goal: Adherence to prescribed nutrition plan.   Low sodium handout and flavor handout in addition to general guidelines.          Nutrition Discharge:  Nutrition Assessments - 07/30/19 1314      MEDFICTS  Scores   Pre Score 13           Education Questionnaire Score:  Knowledge Questionnaire Score - 07/30/19 1314      Knowledge Questionnaire Score   Pre Score 23/26 Education Focus: exercise           Goals reviewed with patient; copy given to patient.

## 2019-11-18 NOTE — Progress Notes (Signed)
Cardiac Individual Treatment Plan  Patient Details  Name: Cheyenne Lopez MRN: [id number] Date of Birth: 01/11/1951 Referring Provider:     Cardiac Rehab from 07/30/2019 in [facility] Cardiac and Pulmonary Rehab  Referring Provider Neoma Laming MD      Initial Encounter Date:    Cardiac Rehab from 07/30/2019 in [facility] Cardiac and Pulmonary Rehab  Date 07/30/19      Visit Diagnosis: NSTEMI (non-ST elevated myocardial infarction) [facility])  Patient's Home Medications on Admission:  Current Outpatient Medications:    ascorbic acid (VITAMIN C) 100 MG tablet, Take 100 mg by mouth daily., Disp: , Rfl:    aspirin 81 MG chewable tablet, Chew 1 tablet (81 mg total) by mouth daily., Disp: 30 tablet, Rfl: 0   atorvastatin (LIPITOR) 80 MG tablet, Take 1 tablet (80 mg total) by mouth daily., Disp: 90 tablet, Rfl: 0   Biotin 10 MG TABS, Take 10 mg by mouth daily., Disp: , Rfl:    chlorhexidine (PERIDEX) 0.12 % solution, 30 mLs by Mouth Rinse route See admin instructions. Rinse as needed for mouth sores., Disp: , Rfl:    Coenzyme Q10 (CO Q 10 PO), Take 1 capsule by mouth daily., Disp: , Rfl:    docusate sodium (COLACE) 100 MG capsule, Take 1 capsule (100 mg total) by mouth daily as needed., Disp: 30 capsule, Rfl: 2   insulin detemir (LEVEMIR) 100 UNIT/ML injection, Inject 18 Units into the skin See admin instructions. For use when not using the insulin pump., Disp: , Rfl:    Insulin Human (INSULIN PUMP) SOLN, Inject into the skin., Disp: , Rfl:    insulin lispro (HUMALOG) 100 UNIT/ML injection, Inject into the skin 3 (three) times daily before meals., Disp: , Rfl:    latanoprost (XALATAN) 0.005 % ophthalmic solution, 1 drop at bedtime., Disp: , Rfl:    Multiple Vitamin (MULTIVITAMIN) capsule, Take 1 capsule by mouth daily., Disp: , Rfl:    nitroGLYCERIN (NITROSTAT) 0.4 MG SL tablet, Place 1 tablet (0.4 mg total) under the tongue every 5 (five) minutes as needed for chest pain., Disp: 9 tablet, Rfl:  12   oxyCODONE-acetaminophen (PERCOCET) 5-325 MG tablet, Take 1 tablet by mouth every 8 (eight) hours as needed. (Patient not taking: Reported on 07/29/2019), Disp: 20 tablet, Rfl: 0   thyroid (ARMOUR) 65 MG tablet, Take 65 mg by mouth daily., Disp: , Rfl:    ticagrelor (BRILINTA) 90 MG TABS tablet, Take 1 tablet (90 mg total) by mouth 2 (two) times daily., Disp: 60 tablet, Rfl: 3   Travoprost, BAK Free, (TRAVATAN) 0.004 % SOLN ophthalmic solution, Place 1 drop into both eyes at bedtime., Disp: , Rfl:    traZODone (DESYREL) 50 MG tablet, Take 25 mg by mouth at bedtime as needed., Disp: , Rfl:    triamcinolone (NASACORT) 55 MCG/ACT AERO nasal inhaler, Place 1 spray into the nose daily as needed., Disp: , Rfl:    valsartan (DIOVAN) 40 MG tablet, Take 20 mg by mouth daily. , Disp: , Rfl: 11   zinc sulfate (ZINC-220) 220 (50 Zn) MG capsule, Take 220 mg by mouth daily., Disp: , Rfl:   Past Medical History: Past Medical History:  Diagnosis Date   Hypertension    Hypothyroid    Type 1 diabetes ([place])     Tobacco Use: Social History   Tobacco Use  Smoking Status Never Smoker  Smokeless Tobacco Never Used    Labs: Recent Review Scientist, physiological    Labs for ITP Cardiac and Pulmonary  Rehab Latest Ref Rng & Units 07/16/2019   Cholestrol 0 - 200 mg/dL 135   LDLCALC 0 - 99 mg/dL 79   HDL >40 mg/dL 45   Trlycerides <150 mg/dL 55   Hemoglobin A1c 4.8 - 5.6 % 6.9(H)       Exercise Target Goals: Exercise Program Goal: Individual exercise prescription set using results from initial 6 min walk test and THRR while considering  patients activity barriers and safety.   Exercise Prescription Goal: Initial exercise prescription builds to 30-45 minutes a day of aerobic activity, 2-3 days per week.  Home exercise guidelines will be given to patient during program as part of exercise prescription that the participant will acknowledge.   Education: Aerobic Exercise & Resistance Training: -  Gives group verbal and written instruction on the various components of exercise. Focuses on aerobic and resistive training programs and the benefits of this training and how to safely progress through these programs..   Education: Exercise & Equipment Safety: - Individual verbal instruction and demonstration of equipment use and safety with use of the equipment.   Cardiac Rehab from 07/29/2019 in [facility] Cardiac and Pulmonary Rehab  Date 07/29/19  Educator [facility]  Instruction Review Code 1- Verbalizes Understanding      Education: Exercise Physiology & General Exercise Guidelines: - Group verbal and written instruction with models to review the exercise physiology of the cardiovascular system and associated critical values. Provides general exercise guidelines with specific guidelines to those with heart or lung disease.    Education: Flexibility, Balance, Mind/Body Relaxation: Provides group verbal/written instruction on the benefits of flexibility and balance training, including mind/body exercise modes such as yoga, pilates and tai chi.  Demonstration and skill practice provided.   Activity Barriers & Risk Stratification:  Activity Barriers & Cardiac Risk Stratification - 07/30/19 1308      Activity Barriers & Cardiac Risk Stratification   Activity Barriers Shortness of Breath;Deconditioning;Muscular Weakness;Other (comment);Balance Concerns    Comments previous vertigo, meds sometimes made it difficult to walk    Cardiac Risk Stratification Moderate           6 Minute Walk:  6 Minute Walk    Row Name 07/30/19 1305         6 Minute Walk   Phase Initial     Distance 1265 feet     Walk Time 6 minutes     # of Rest Breaks 0     MPH 2.4     METS 2.75     RPE 11     VO2 Peak 9.64     Symptoms Yes (comment)     Comments fatigue, r hip pain/dull ache 2-3/10     Resting HR 63 bpm     Resting BP 138/72     Resting Oxygen Saturation  99 %     Exercise Oxygen Saturation  during 6  min walk 96 %     Max Ex. HR 87 bpm     Max Ex. BP 144/74     2 Minute Post BP 128/70            Oxygen Initial Assessment:   Oxygen Re-Evaluation:   Oxygen Discharge (Final Oxygen Re-Evaluation):   Initial Exercise Prescription:  Initial Exercise Prescription - 07/30/19 1300      Date of Initial Exercise RX and Referring Provider   Date 07/30/19    Referring Provider Neoma Laming MD      Treadmill   MPH 2.3  Grade 0    Minutes 15    METs 2.76      Elliptical   Level 1    Speed 2.3    Minutes 15    METs 2      REL-XR   Level 1    Speed 50    Minutes 15    METs 2      Prescription Details   Frequency (times per week) 3    Duration Progress to 30 minutes of continuous aerobic without signs/symptoms of physical distress      Intensity   THRR 40-80% of Max Heartrate 99-134    Ratings of Perceived Exertion 11-13    Perceived Dyspnea 0-4      Progression   Progression Continue to progress workloads to maintain intensity without signs/symptoms of physical distress.      Resistance Training   Training Prescription Yes    Weight 3 lb    Reps 10-15           Perform Capillary Blood Glucose checks as needed.  Exercise Prescription Changes:  Exercise Prescription Changes    Row Name 07/30/19 1300 08/18/19 1600 08/31/19 1600 09/04/19 1100 09/15/19 1400     Response to Exercise   Blood Pressure (Admit) 138/72 122/82 122/56 -- 100/62   Blood Pressure (Exercise) 144/74 162/58 150/70 -- 140/58   Blood Pressure (Exit) 128/70 118/58 112/60 -- 104/60   Heart Rate (Admit) 63 bpm 72 bpm 73 bpm -- 58 bpm   Heart Rate (Exercise) 87 bpm 100 bpm 106 bpm -- 113 bpm   Heart Rate (Exit) 63 bpm 83 bpm 82 bpm -- 73 bpm   Oxygen Saturation (Admit) 99 % -- -- -- --   Oxygen Saturation (Exercise) 96 % -- -- -- --   Rating of Perceived Exertion (Exercise) 11 12 12  -- 13   Symptoms fatigue, r hip pain dull ache 2-3/10 none none -- none   Comments walk test results --  -- -- --   Duration -- Continue with 30 min of aerobic exercise without signs/symptoms of physical distress. Continue with 30 min of aerobic exercise without signs/symptoms of physical distress. -- Continue with 30 min of aerobic exercise without signs/symptoms of physical distress.   Intensity -- THRR unchanged THRR unchanged -- THRR unchanged     Progression   Progression -- Continue to progress workloads to maintain intensity without signs/symptoms of physical distress. Continue to progress workloads to maintain intensity without signs/symptoms of physical distress. -- Continue to progress workloads to maintain intensity without signs/symptoms of physical distress.   Average METs -- 3.5 3.51 -- 3     Resistance Training   Training Prescription -- Yes Yes -- Yes   Weight -- 3 lb 3 lb -- 3 lb   Reps -- 10-15 10-15 -- 10-15     Interval Training   Interval Training -- -- No -- No     Treadmill   MPH -- 2.3 2.9 -- 2.7   Grade -- 0.5 0.5 -- 1.5   Minutes -- 15 15 -- 15   METs -- 2.92 3.42 -- 3.63     NuStep   Level -- -- -- -- 3   SPM -- -- -- -- 80   Minutes -- -- -- -- 15   METs -- -- -- -- 2.4     REL-XR   Level -- -- 1 -- --   Minutes -- -- 15 -- --   METs -- -- 5 -- --  T5 Nustep   Level -- 3 2 -- --   SPM -- 80 -- -- --   Minutes -- 15 15 -- --   METs -- 2 2.1 -- --     Home Exercise Plan   Plans to continue exercise at -- -- -- Home (comment)  walking and staff videos --   Frequency -- -- -- Add 2 additional days to program exercise sessions. --   Initial Home Exercises Provided -- -- -- 09/04/19 --   [place] Name 09/29/19 1400 10/28/19 1100           Response to Exercise   Blood Pressure (Admit) 102/58 126/60      Blood Pressure (Exercise) 126/64 128/70      Blood Pressure (Exit) 104/56 122/62      Heart Rate (Admit) 89 bpm 58 bpm      Heart Rate (Exercise) 94 bpm 81 bpm      Heart Rate (Exit) 75 bpm 63 bpm      Rating of Perceived Exertion (Exercise) 12 13       Symptoms none none      Duration Continue with 30 min of aerobic exercise without signs/symptoms of physical distress. Progress to 30 minutes of  aerobic without signs/symptoms of physical distress      Intensity THRR unchanged THRR unchanged        Progression   Progression Continue to progress workloads to maintain intensity without signs/symptoms of physical distress. Continue to progress workloads to maintain intensity without signs/symptoms of physical distress.      Average METs 3.04 2.81        Resistance Training   Training Prescription Yes Yes      Weight 3 lb 3 lb      Reps 10-15 10-15        Interval Training   Interval Training No No        Treadmill   MPH 2.9 2.8      Grade 2.5 1.5      Minutes 15 15      METs 4.22 3.72        REL-XR   Level 3 --      Minutes 15 --      METs 2.9 --        T5 Nustep   Level 3 3      Minutes 15 15      METs 2 1.9        Home Exercise Plan   Plans to continue exercise at Home (comment)  walking and staff videos Home (comment)  walking and staff videos      Frequency Add 2 additional days to program exercise sessions. Add 2 additional days to program exercise sessions.      Initial Home Exercises Provided 09/04/19 09/04/19             Exercise Comments:   Exercise Goals and Review:  Exercise Goals    Row Name 07/30/19 1312             Exercise Goals   Increase Physical Activity Yes       Intervention Provide advice, education, support and counseling about physical activity/exercise needs.;Develop an individualized exercise prescription for aerobic and resistive training based on initial evaluation findings, risk stratification, comorbidities and participant's personal goals.       Expected Outcomes Short Term: Attend rehab on a regular basis to increase amount of physical activity.;Long Term: Add in home exercise to make exercise  part of routine and to increase amount of physical activity.;Long Term: Exercising  regularly at least 3-5 days a week.       Increase Strength and Stamina Yes       Intervention Provide advice, education, support and counseling about physical activity/exercise needs.;Develop an individualized exercise prescription for aerobic and resistive training based on initial evaluation findings, risk stratification, comorbidities and participant's personal goals.       Expected Outcomes Short Term: Increase workloads from initial exercise prescription for resistance, speed, and METs.;Short Term: Perform resistance training exercises routinely during rehab and add in resistance training at home;Long Term: Improve cardiorespiratory fitness, muscular endurance and strength as measured by increased METs and functional capacity (6MWT)       Able to understand and use rate of perceived exertion (RPE) scale Yes       Intervention Provide education and explanation on how to use RPE scale       Expected Outcomes Short Term: Able to use RPE daily in rehab to express subjective intensity level;Long Term:  Able to use RPE to guide intensity level when exercising independently       Able to understand and use Dyspnea scale Yes       Intervention Provide education and explanation on how to use Dyspnea scale       Expected Outcomes Short Term: Able to use Dyspnea scale daily in rehab to express subjective sense of shortness of breath during exertion;Long Term: Able to use Dyspnea scale to guide intensity level when exercising independently       Knowledge and understanding of Target Heart Rate Range (THRR) Yes       Intervention Provide education and explanation of THRR including how the numbers were predicted and where they are located for reference       Expected Outcomes Short Term: Able to state/look up THRR;Long Term: Able to use THRR to govern intensity when exercising independently;Short Term: Able to use daily as guideline for intensity in rehab       Able to check pulse independently Yes        Intervention Provide education and demonstration on how to check pulse in carotid and radial arteries.;Review the importance of being able to check your own pulse for safety during independent exercise       Expected Outcomes Short Term: Able to explain why pulse checking is important during independent exercise;Long Term: Able to check pulse independently and accurately       Understanding of Exercise Prescription Yes       Intervention Provide education, explanation, and written materials on patient's individual exercise prescription       Expected Outcomes Short Term: Able to explain program exercise prescription;Long Term: Able to explain home exercise prescription to exercise independently              Exercise Goals Re-Evaluation :  Exercise Goals Re-Evaluation    Row Name 08/05/19 1023 08/18/19 1607 08/31/19 1603 09/04/19 1040 09/14/19 1025     Exercise Goal Re-Evaluation   Exercise Goals Review Increase Physical Activity;Able to understand and use rate of perceived exertion (RPE) scale;Knowledge and understanding of Target Heart Rate Range (THRR);Increase Strength and Stamina;Able to check pulse independently Increase Physical Activity;Increase Strength and Stamina;Able to understand and use rate of perceived exertion (RPE) scale;Able to understand and use Dyspnea scale;Knowledge and understanding of Target Heart Rate Range (THRR);Able to check pulse independently;Understanding of Exercise Prescription Increase Physical Activity;Increase Strength and Stamina;Understanding of Exercise Prescription Increase Physical  Activity;Increase Strength and Stamina;Understanding of Exercise Prescription Increase Physical Activity;Increase Strength and Stamina;Understanding of Exercise Prescription   Comments Reviewed RPE and dyspnea scales, THR and program prescription with pt today.  Pt voiced understanding and was given a copy of goals to take home. Sherida is doing well in rehab.  She has increased  incline on TM.  Staff willmonitor progress. Lyn continues to do well in rehab.  She is now up to 5 METs on the XR. We will continue to montior her progress. Jnya is doing well in rehab.  She is getting in some already for 25 min of walking.  We talked about increasing to 30 min.  Reviewed home exercise with pt today.  Pt plans to continue walking at home for exercise.  She is to increase her time to 30 min.  Reviewed THR, pulse, RPE, sign and symptoms, pulse oximetery and when to call 911 or MD.  Also discussed weather considerations and indoor options.  Pt voiced understanding. Ilse does walk at home - she gets a litle out of breath when walking uphill.   She will try to use staff videos when its too hot to walk outside.   Expected Outcomes Short: Use RPE daily to regulate intensity. Long: Follow program prescription in THR. Short:  attend consistently Long: increase overall stamina Short: Increase workloads Long: Continue to improve stamina. Short: Increase exercise time at home to 30 min.  Long; Continue to improve stamina. Short: continue to walk at home Long: improve stamina overall   Row Name 09/29/19 1421 10/26/19 1023           Exercise Goal Re-Evaluation   Exercise Goals Review Increase Physical Activity;Increase Strength and Stamina;Understanding of Exercise Prescription Increase Physical Activity;Increase Strength and Stamina;Understanding of Exercise Prescription      Comments Charmain has been out with pain since 09/18/19.  She is working with her doctor.  We will continue to monitor her progress. Alexie had some soreness after her first session back on 10/23/19.  We reviewed normal muscle soreness patterns and trying a stretching video at home to help alleviate soreness.      Expected Outcomes Short: Recover from pain  Long; Conitnue to improve stamina. Short: get back to regular attendance Long:  improve stamina and reduce muscle pain             Discharge Exercise Prescription (Final  Exercise Prescription Changes):  Exercise Prescription Changes - 10/28/19 1100      Response to Exercise   Blood Pressure (Admit) 126/60    Blood Pressure (Exercise) 128/70    Blood Pressure (Exit) 122/62    Heart Rate (Admit) 58 bpm    Heart Rate (Exercise) 81 bpm    Heart Rate (Exit) 63 bpm    Rating of Perceived Exertion (Exercise) 13    Symptoms none    Duration Progress to 30 minutes of  aerobic without signs/symptoms of physical distress    Intensity THRR unchanged      Progression   Progression Continue to progress workloads to maintain intensity without signs/symptoms of physical distress.    Average METs 2.81      Resistance Training   Training Prescription Yes    Weight 3 lb    Reps 10-15      Interval Training   Interval Training No      Treadmill   MPH 2.8    Grade 1.5    Minutes 15    METs 3.72  T5 Nustep   Level 3    Minutes 15    METs 1.9      Home Exercise Plan   Plans to continue exercise at Home (comment)   walking and staff videos   Frequency Add 2 additional days to program exercise sessions.    Initial Home Exercises Provided 09/04/19           Nutrition:  Target Goals: Understanding of nutrition guidelines, daily intake of sodium <1561m, cholesterol <2056m calories 30% from fat and 7% or less from saturated fats, daily to have 5 or more servings of fruits and vegetables.  Education: Controlling Sodium/Reading Food Labels -Group verbal and written material supporting the discussion of sodium use in heart healthy nutrition. Review and explanation with models, verbal and written materials for utilization of the food label.   Education: General Nutrition Guidelines/Fats and Fiber: -Group instruction provided by verbal, written material, models and posters to present the general guidelines for heart healthy nutrition. Gives an explanation and review of dietary fats and fiber.   Biometrics:  Pre Biometrics - 07/30/19 1313      Pre  Biometrics   Height 5' 0.75" (1.543 m)    Weight 146 lb 14.4 oz (66.6 kg)    BMI (Calculated) 27.99    Single Leg Stand 13.6 seconds            Nutrition Therapy Plan and Nutrition Goals:  Nutrition Therapy & Goals - 08/17/19 1024      Nutrition Therapy   Diet Low Na, heart healthy, diabetes friendly    Protein (specify units) 55-60g    Fiber 25 grams    Whole Grain Foods 3 servings    Saturated Fats 12 max. grams    Fruits and Vegetables 5 servings/day    Sodium 1.5 grams      Personal Nutrition Goals   Nutrition Goal ST: add in fats to meals, add cannellini beans to avocado toast to add protein, indentify high Na sources in diet LT: Wants to get back to gardening and other activities she enjoys    Comments T1DM 47 Years in July. A1c 6.9. B: coffee (stevia)(oatmilk, almond), cereal (oatmeal and cheerios - oatmilk) L: Salad  and chicken D: salad (kits - vinegarette) and chicken or fish or couscous or bulgur wheat, beans. Hunger levels normal throughout the day due to lower appetite. Pt uses olive oil. Discussed heart healthy eating, balance, variety, and diabetes.      Intervention Plan   Intervention Prescribe, educate and counsel regarding individualized specific dietary modifications aiming towards targeted core components such as weight, hypertension, lipid management, diabetes, heart failure and other comorbidities.;Nutrition handout(s) given to patient.    Expected Outcomes Short Term Goal: Understand basic principles of dietary content, such as calories, fat, sodium, cholesterol and nutrients.;Short Term Goal: A plan has been developed with personal nutrition goals set during dietitian appointment.;Long Term Goal: Adherence to prescribed nutrition plan.   Low sodium handout and flavor handout in addition to general guidelines.          Nutrition Assessments:  Nutrition Assessments - 07/30/19 1314      MEDFICTS Scores   Pre Score 13           MEDIFICTS Score Key:           ?70 Need to make dietary changes          40-70 Heart Healthy Diet         ? 40 Therapeutic Level Cholesterol Diet  Nutrition Goals Re-Evaluation:  Nutrition Goals Re-Evaluation    [place] Name 09/14/19 1023             Goals   Nutrition Goal Unique has added beans to toast for protein.  She has been watching sodium.  We discussed Mrs Deliah Boston or other no sodium options.       Expected Outcome Short: continue to watch sodium Long: maintain healthy diet to manage DM and heart health              Nutrition Goals Discharge (Final Nutrition Goals Re-Evaluation):  Nutrition Goals Re-Evaluation - 09/14/19 1023      Goals   Nutrition Goal Mariella has added beans to toast for protein.  She has been watching sodium.  We discussed Mrs Deliah Boston or other no sodium options.    Expected Outcome Short: continue to watch sodium Long: maintain healthy diet to manage DM and heart health           Psychosocial: Target Goals: Acknowledge presence or absence of significant depression and/or stress, maximize coping skills, provide positive support system. Participant is able to verbalize types and ability to use techniques and skills needed for reducing stress and depression.   Education: Depression - Provides group verbal and written instruction on the correlation between heart/lung disease and depressed mood, treatment options, and the stigmas associated with seeking treatment.   Education: Sleep Hygiene -Provides group verbal and written instruction about how sleep can affect your health.  Define sleep hygiene, discuss sleep cycles and impact of sleep habits. Review good sleep hygiene tips.     Education: Stress and Anxiety: - Provides group verbal and written instruction about the health risks of elevated stress and causes of high stress.  Discuss the correlation between heart/lung disease and anxiety and treatment options. Review healthy ways to manage with stress and anxiety.    Initial Review  & Psychosocial Screening:  Initial Psych Review & Screening - 07/29/19 1336      Initial Review   Current issues with None Identified      Family Dynamics   Good Support System? Yes    Comments She can look  to her husband, son and borther inlaw for support. She has a positive outlook on her health.      Barriers   Psychosocial barriers to participate in program There are no identifiable barriers or psychosocial needs.      Screening Interventions   Interventions Encouraged to exercise;Provide feedback about the scores to participant;To provide support and resources with identified psychosocial needs    Expected Outcomes Short Term goal: Utilizing psychosocial counselor, staff and physician to assist with identification of specific Stressors or current issues interfering with healing process. Setting desired goal for each stressor or current issue identified.;Short Term goal: Identification and review with participant of any Quality of Life or Depression concerns found by scoring the questionnaire.;Long Term Goal: Stressors or current issues are controlled or eliminated.;Long Term goal: The participant improves quality of Life and PHQ9 Scores as seen by post scores and/or verbalization of changes           Quality of Life Scores:   Quality of Life - 07/30/19 1313      Quality of Life   Select Quality of Life      Quality of Life Scores   Health/Function Pre 24.46 %    Socioeconomic Pre 27 %    Psych/Spiritual Pre 26.57 %    Family Pre 26.4 %  GLOBAL Pre 25.74 %          Scores of 19 and below usually indicate a poorer quality of life in these areas.  A difference of  2-3 points is a clinically meaningful difference.  A difference of 2-3 points in the total score of the Quality of Life Index has been associated with significant improvement in overall quality of life, self-image, physical symptoms, and general health in studies assessing change in quality of  life.  PHQ-9: Recent Review Flowsheet Data    Depression screen [facility] 2/9 08/14/2019 07/30/2019   Decreased Interest 0 0   Down, Depressed, Hopeless 0 0   PHQ - 2 Score 0 0   Altered sleeping 0 1   Tired, decreased energy 0 3   Change in appetite 0 0   Feeling bad or failure about yourself  0 0   Trouble concentrating 0 1   Moving slowly or fidgety/restless 0 0   Suicidal thoughts 0 0   PHQ-9 Score 0 5   Difficult doing work/chores - Somewhat difficult     Interpretation of Total Score  Total Score Depression Severity:  1-4 = Minimal depression, 5-9 = Mild depression, 10-14 = Moderate depression, 15-19 = Moderately severe depression, 20-27 = Severe depression   Psychosocial Evaluation and Intervention:  Psychosocial Evaluation - 07/29/19 1401      Psychosocial Evaluation & Interventions   Interventions Encouraged to exercise with the program and follow exercise prescription    Comments Patient has a positive outlook on her health and states no depression issues.    Expected Outcomes Short: Attend [facility] stress management education to decrease stress. Long: Maintain exercise Post HeartTrack to keep stress at a minimum.    Continue Psychosocial Services  Follow up required by staff           Psychosocial Re-Evaluation:  Psychosocial Re-Evaluation    Menan Name 09/04/19 1043 09/14/19 1021 10/26/19 1019         Psychosocial Re-Evaluation   Current issues with Current Stress Concerns;Current Sleep Concerns -- Current Stress Concerns;Current Sleep Concerns     Comments Luan is doing well, she does admit to having some down days, but overall is good.  She denies major stressors in life.  Her biggest concern is waking up at 4am daily.  She will start to play games or read to help decompress for a little bit.  Overall, she is able to get about 6 hours of sleep.  We talked about reviewing the sleep videos online.  They are also getting their house ready to move. Nilani is still waking up  at 3:30-4 am.  She doesnt feel rested.  She does read or play games and does get sleepy again in a couple hours but has to get up for the day. Lasha is feeling better mentally since she has answers about why she had muscle pain.  She is still waking up at 3:30 am and up til 5 am then sleeps again until 7.  She has spoken with her Dr and has Trazodone that she takes a half pill as directed.     Expected Outcomes Short: Watch sleep videos Long: Continue to stay positive. Short: watch videos Long: develop better sleep patterns Short:  follow up with Dr about sleep Long: develop better sleep patterns     Interventions Stress management education;Encouraged to attend Cardiac Rehabilitation for the exercise -- --     Continue Psychosocial Services  Follow up required by staff -- --  Psychosocial Discharge (Final Psychosocial Re-Evaluation):  Psychosocial Re-Evaluation - 10/26/19 1019      Psychosocial Re-Evaluation   Current issues with Current Stress Concerns;Current Sleep Concerns    Comments Lashone is feeling better mentally since she has answers about why she had muscle pain.  She is still waking up at 3:30 am and up til 5 am then sleeps again until 7.  She has spoken with her Dr and has Trazodone that she takes a half pill as directed.    Expected Outcomes Short:  follow up with Dr about sleep Long: develop better sleep patterns           Vocational Rehabilitation: Provide vocational rehab assistance to qualifying candidates.   Vocational Rehab Evaluation & Intervention:   Education: Education Goals: Education classes will be provided on a variety of topics geared toward better understanding of heart health and risk factor modification. Participant will state understanding/return demonstration of topics presented as noted by education test scores.  Learning Barriers/Preferences:  Learning Barriers/Preferences - 07/29/19 1338      Learning Barriers/Preferences   Learning  Barriers None    Learning Preferences None           General Cardiac Education Topics:  AED/CPR: - Group verbal and written instruction with the use of models to demonstrate the basic use of the AED with the basic ABC's of resuscitation.   Anatomy & Physiology of the Heart: - Group verbal and written instruction and models provide basic cardiac anatomy and physiology, with the coronary electrical and arterial systems. Review of Valvular disease and Heart Failure   Cardiac Procedures: - Group verbal and written instruction to review commonly prescribed medications for heart disease. Reviews the medication, class of the drug, and side effects. Includes the steps to properly store meds and maintain the prescription regimen. (beta blockers and nitrates)   Cardiac Medications I: - Group verbal and written instruction to review commonly prescribed medications for heart disease. Reviews the medication, class of the drug, and side effects. Includes the steps to properly store meds and maintain the prescription regimen.   Cardiac Medications II: -Group verbal and written instruction to review commonly prescribed medications for heart disease. Reviews the medication, class of the drug, and side effects. (all other drug classes)    Go Sex-Intimacy & Heart Disease, Get SMART - Goal Setting: - Group verbal and written instruction through game format to discuss heart disease and the return to sexual intimacy. Provides group verbal and written material to discuss and apply goal setting through the application of the S.M.A.R.T. Method.   Other Matters of the Heart: - Provides group verbal, written materials and models to describe Stable Angina and Peripheral Artery. Includes description of the disease process and treatment options available to the cardiac patient.   Infection Prevention: - Provides verbal and written material to individual with discussion of infection control including proper  hand washing and proper equipment cleaning during exercise session.   Cardiac Rehab from 07/29/2019 in [facility] Cardiac and Pulmonary Rehab  Date 07/29/19  Educator [facility]  Instruction Review Code 1- Verbalizes Understanding      Falls Prevention: - Provides verbal and written material to individual with discussion of falls prevention and safety.   Cardiac Rehab from 07/29/2019 in [facility] Cardiac and Pulmonary Rehab  Date 07/29/19  Educator [facility]  Instruction Review Code 1- Verbalizes Understanding      Other: -Provides group and verbal instruction on various topics (see comments)   Knowledge Questionnaire Score:  Knowledge Questionnaire Score - 07/30/19 1314      Knowledge Questionnaire Score   Pre Score 23/26 Education Focus: exercise           Core Components/Risk Factors/Patient Goals at Admission:  Personal Goals and Risk Factors at Admission - 07/30/19 1314      Core Components/Risk Factors/Patient Goals on Admission    Weight Management Yes;Weight Loss    Intervention Weight Management: Develop a combined nutrition and exercise program designed to reach desired caloric intake, while maintaining appropriate intake of nutrient and fiber, sodium and fats, and appropriate energy expenditure required for the weight goal.;Weight Management: Provide education and appropriate resources to help participant work on and attain dietary goals.    Admit Weight 146 lb 14.4 oz (66.6 kg)    Goal Weight: Short Term 141 lb (64 kg)    Goal Weight: Long Term 135 lb (61.2 kg)    Expected Outcomes Short Term: Continue to assess and modify interventions until short term weight is achieved;Weight Loss: Understanding of general recommendations for a balanced deficit meal plan, which promotes 1-2 lb weight loss per week and includes a negative energy balance of [phone] kcal/d;Understanding recommendations for meals to include 15-35% energy as protein, 25-35% energy from fat, 35-60% energy from carbohydrates, less  than 221m of dietary cholesterol, 20-35 gm of total fiber daily;Understanding of distribution of calorie intake throughout the day with the consumption of 4-5 meals/snacks    Diabetes Yes    Intervention Provide education about signs/symptoms and action to take for hypo/hyperglycemia.;Provide education about proper nutrition, including hydration, and aerobic/resistive exercise prescription along with prescribed medications to achieve blood glucose in normal ranges: Fasting glucose 65-99 mg/dL    Expected Outcomes Short Term: Participant verbalizes understanding of the signs/symptoms and immediate care of hyper/hypoglycemia, proper foot care and importance of medication, aerobic/resistive exercise and nutrition plan for blood glucose control.;Long Term: Attainment of HbA1C < 7%.    Hypertension Yes    Intervention Provide education on lifestyle modifcations including regular physical activity/exercise, weight management, moderate sodium restriction and increased consumption of fresh fruit, vegetables, and low fat dairy, alcohol moderation, and smoking cessation.;Monitor prescription use compliance.    Expected Outcomes Short Term: Continued assessment and intervention until BP is < 140/96mHG in hypertensive participants. < 130/8044mG in hypertensive participants with diabetes, heart failure or chronic kidney disease.;Long Term: Maintenance of blood pressure at goal levels.    Lipids Yes    Intervention Provide education and support for participant on nutrition & aerobic/resistive exercise along with prescribed medications to achieve LDL <65m72mDL >40mg52m Expected Outcomes Short Term: Participant states understanding of desired cholesterol values and is compliant with medications prescribed. Participant is following exercise prescription and nutrition guidelines.;Long Term: Cholesterol controlled with medications as prescribed, with individualized exercise RX and with personalized nutrition plan. Value  goals: LDL < 65mg,54m > 40 mg.           Education:Diabetes - Individual verbal and written instruction to review signs/symptoms of diabetes, desired ranges of glucose level fasting, after meals and with exercise. Acknowledge that pre and post exercise glucose checks will be done for 3 sessions at entry of program.   [facility]  Date 07/29/19  [facility]- Verbalizes Understanding      Education: Know Your Numbers and Risk Factors: -Group verbal and written instruction about important numbers in your health.  Discussion of what  are risk factors and how they play a role in the disease process.  Review of Cholesterol, Blood Pressure, Diabetes, and BMI and the role they play in your overall health.   Core Components/Risk Factors/Patient Goals Review:   Goals and Risk Factor Review    Row Name 09/04/19 1046 09/14/19 1019 10/26/19 1010         Core Components/Risk Factors/Patient Goals Review   Personal Goals Review Weight Management/Obesity;Hypertension;Diabetes;Lipids Weight Management/Obesity;Hypertension;Diabetes;Lipids Weight Management/Obesity;Hypertension;Diabetes;Lipids     Review Ayelet is doing well in rehab, she has already lost a couple of pounds.  Her blood sugars have been erratic and stress will trigger a reaction and we talked about how exercise can help.  Blood pressures have been good overall in class. She does not check it at home as things are scattered getting ready to move. [place] monitors her BG at home.  They still have been erratic - she sees Dr. in September.  Advised her to call if needed before then. Merrily says her BG has been more steady.  She has missed several weeks due to muscle pain and stopped taking statin.  She has spoken with her Dr about stopping it and sees cardiologist in September. 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  She was a little sore  after working out Friday - first time back.  She has a laser eye procedure Monday and will get clearance to return.     Expected Outcomes Short: Get sugars under better control. Long Continue to monitor risk factors. Short: follow up with Dr about sugars Long: manage risk factors Short:  continue to follow up with Dr about meds and symptoms  Long:  manage risk factors            Core Components/Risk Factors/Patient Goals at Discharge (Final Review):   Goals and Risk Factor Review - 10/26/19 1010      Core Components/Risk Factors/Patient Goals Review   Personal Goals Review Weight Management/Obesity;Hypertension;Diabetes;Lipids    Review Trynity says her BG has been more steady.  She has missed several weeks due to muscle pain and stopped taking statin.  She has spoken with her Dr about stopping it and sees cardiologist in September. 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  She was a little sore after working out Friday - first time back.  She has a laser eye procedure Monday and will get clearance to return.    Expected Outcomes Short:  continue to follow up with Dr about meds and symptoms  Long:  manage risk factors           ITP Comments:  ITP Comments    Row Name 07/29/19 1344 07/30/19 1304 08/05/19 1022 08/12/19 0744 09/09/19 0645   ITP Comments Virtual Visit Completed. Patient informed of EP and RD schedule. Visit diagnosis can be found in [facility] 07/14/2019 Completed 6MWT and gym orientation.  Initial ITP created and sent for review to Dr. Emily Filbert, Medical Director. First full day of exercise!  Patient was oriented to gym and equipment including functions, settings, policies, and procedures.  Patient's individual exercise prescription and treatment plan were reviewed.  All starting workloads were established based on the results of the 6 minute walk test done at initial orientation visit.  The plan for exercise progression was also introduced and progression  will be customized based on patient's performance and goals 30 Day review completed. ITP review done, changes made as directed,and approval shown by signature of program Medical  Director.  New to program 30 Day review completed. Medical Director ITP review done, changes made as directed, and signed approval by Medical Director.   [place] Name 09/23/19 0902 09/29/19 1421 10/07/19 1109 10/13/19 1327 11/04/19 0732   ITP Comments Patient called stating that she has been in pain (legs and lower back) since last week and is not comfortable coming to rehab this week. Encouraged to call doctor to discuss further evaluation Nikeshia called to say she wasn't here today due to continued pain and foot swelling.  She saw a chiropractor and a rheumatologist who tested for for myalgia.  Her CRP is elevated.  She also contacted her cardiologist about Lipitor as she had a reaction Wm called to say she wasn't here today due to continued pain and foot swelling.  She saw a chiropractor and a rheumatologist who tested for for myalgia.  Her CRP is elevated.  She also contacted her cardiologist about Lipitor as she had a reaction to Crestor with similar side effects.  She stopped taking it on her own.She will follow up once she hears from her Dr.to Crestor with similar side effects.  She stopped taking it on her own.She will follow up once she hears from her Dr. 38 Day review completed. Medical Director ITP review done, changes made as directed, and signed approval by Medical Director. Emonni has not attended since last review 30 Day review completed. Medical Director ITP review done, changes made as directed, and signed approval by Medical Director.   [place] Name 11/18/19 1013           ITP Comments Nikky is now wearing a boot and has a referral for PT.  She has not returned our call.  We will send her a letter to discharge her at this time.              Comments: Discharge ITP

## 2020-02-09 ENCOUNTER — Other Ambulatory Visit: Payer: Self-pay | Admitting: Family Medicine

## 2020-02-09 DIAGNOSIS — Z1231 Encounter for screening mammogram for malignant neoplasm of breast: Secondary | ICD-10-CM

## 2020-03-23 ENCOUNTER — Ambulatory Visit
Admission: RE | Admit: 2020-03-23 | Discharge: 2020-03-23 | Disposition: A | Discharge status: Home / Self Care | Payer: 59 | Source: Ambulatory Visit | Attending: Family Medicine | Admitting: Family Medicine

## 2020-03-23 ENCOUNTER — Other Ambulatory Visit: Payer: Self-pay

## 2020-03-23 DIAGNOSIS — Z1231 Encounter for screening mammogram for malignant neoplasm of breast: Secondary | ICD-10-CM | POA: Diagnosis present

## 2020-06-06 DIAGNOSIS — H40053 Ocular hypertension, bilateral: Secondary | ICD-10-CM | POA: Insufficient documentation

## 2020-08-30 ENCOUNTER — Emergency Department
Admission: EM | Admit: 2020-08-30 | Discharge: 2020-08-30 | Disposition: A | Discharge status: Home / Self Care | Payer: 59 | Attending: Emergency Medicine | Admitting: Emergency Medicine

## 2020-08-30 ENCOUNTER — Emergency Department: Payer: 59

## 2020-08-30 DIAGNOSIS — E039 Hypothyroidism, unspecified: Secondary | ICD-10-CM | POA: Diagnosis not present

## 2020-08-30 DIAGNOSIS — I10 Essential (primary) hypertension: Secondary | ICD-10-CM | POA: Insufficient documentation

## 2020-08-30 DIAGNOSIS — R110 Nausea: Secondary | ICD-10-CM | POA: Diagnosis not present

## 2020-08-30 DIAGNOSIS — R5381 Other malaise: Secondary | ICD-10-CM | POA: Diagnosis not present

## 2020-08-30 DIAGNOSIS — Y92002 Bathroom of unspecified non-institutional (private) residence single-family (private) house as the place of occurrence of the external cause: Secondary | ICD-10-CM | POA: Diagnosis not present

## 2020-08-30 DIAGNOSIS — W19XXXA Unspecified fall, initial encounter: Secondary | ICD-10-CM | POA: Diagnosis not present

## 2020-08-30 DIAGNOSIS — K08539 Fractured dental restorative material, unspecified: Secondary | ICD-10-CM

## 2020-08-30 DIAGNOSIS — E109 Type 1 diabetes mellitus without complications: Secondary | ICD-10-CM | POA: Insufficient documentation

## 2020-08-30 DIAGNOSIS — Z955 Presence of coronary angioplasty implant and graft: Secondary | ICD-10-CM | POA: Diagnosis not present

## 2020-08-30 DIAGNOSIS — R55 Syncope and collapse: Secondary | ICD-10-CM | POA: Diagnosis not present

## 2020-08-30 DIAGNOSIS — M25511 Pain in right shoulder: Secondary | ICD-10-CM | POA: Diagnosis not present

## 2020-08-30 DIAGNOSIS — Z7982 Long term (current) use of aspirin: Secondary | ICD-10-CM | POA: Insufficient documentation

## 2020-08-30 DIAGNOSIS — Z794 Long term (current) use of insulin: Secondary | ICD-10-CM | POA: Insufficient documentation

## 2020-08-30 DIAGNOSIS — Z79899 Other long term (current) drug therapy: Secondary | ICD-10-CM | POA: Insufficient documentation

## 2020-08-30 LAB — CBC WITH DIFFERENTIAL/PLATELET
Abs Immature Granulocytes: 0.07 10*3/uL (ref 0.00–0.07)
Basophils Absolute: 0 10*3/uL (ref 0.0–0.1)
Basophils Relative: 0 %
Eosinophils Absolute: 0 10*3/uL (ref 0.0–0.5)
Eosinophils Relative: 0 %
HCT: 31.7 % — ABNORMAL LOW (ref 36.0–46.0)
Hemoglobin: 10.6 g/dL — ABNORMAL LOW (ref 12.0–15.0)
Immature Granulocytes: 1 %
Lymphocytes Relative: 10 %
Lymphs Abs: 1 10*3/uL (ref 0.7–4.0)
MCH: 33.5 pg (ref 26.0–34.0)
MCHC: 33.4 g/dL (ref 30.0–36.0)
MCV: 100.3 fL — ABNORMAL HIGH (ref 80.0–100.0)
Monocytes Absolute: 0.5 10*3/uL (ref 0.1–1.0)
Monocytes Relative: 5 %
Neutro Abs: 8 10*3/uL — ABNORMAL HIGH (ref 1.7–7.7)
Neutrophils Relative %: 84 %
Platelets: 151 10*3/uL (ref 150–400)
RBC: 3.16 MIL/uL — ABNORMAL LOW (ref 3.87–5.11)
RDW: 12.8 % (ref 11.5–15.5)
WBC: 9.6 10*3/uL (ref 4.0–10.5)
nRBC: 0 % (ref 0.0–0.2)

## 2020-08-30 LAB — COMPREHENSIVE METABOLIC PANEL
ALT: 25 U/L (ref 0–44)
AST: 27 U/L (ref 15–41)
Albumin: 3.5 g/dL (ref 3.5–5.0)
Alkaline Phosphatase: 65 U/L (ref 38–126)
Anion gap: 5 (ref 5–15)
BUN: 16 mg/dL (ref 8–23)
CO2: 23 mmol/L (ref 22–32)
Calcium: 8 mg/dL — ABNORMAL LOW (ref 8.9–10.3)
Chloride: 105 mmol/L (ref 98–111)
Creatinine, Ser: 0.67 mg/dL (ref 0.44–1.00)
GFR, Estimated: >60 mL/min (ref >60)
Glucose, Bld: 205 mg/dL — ABNORMAL HIGH (ref 70–99)
Potassium: 3.7 mmol/L (ref 3.5–5.1)
Sodium: 133 mmol/L — ABNORMAL LOW (ref 135–145)
Total Bilirubin: 0.6 mg/dL (ref 0.3–1.2)
Total Protein: 5.9 g/dL — ABNORMAL LOW (ref 6.5–8.1)

## 2020-08-30 LAB — TROPONIN I (HIGH SENSITIVITY)
Troponin I (High Sensitivity): 2 ng/L (ref <18)
Troponin I (High Sensitivity): 3 ng/L (ref <18)

## 2020-08-30 LAB — LIPASE, BLOOD: Lipase: 26 U/L (ref 11–51)

## 2020-08-30 MED ORDER — ONDANSETRON HCL 4 MG/2ML IJ SOLN
4.0000 mg | Freq: Once | INTRAMUSCULAR | Status: AC
  Administered 2020-08-30: 4 mg via INTRAVENOUS
  Filled 2020-08-30: qty 2

## 2020-08-30 MED ORDER — ONDANSETRON 4 MG PO TBDP: 4.0000 mg | ORAL_TABLET | Freq: Three times a day (TID) | ORAL | 0 refills | Status: DC | PRN

## 2020-08-30 MED ORDER — SODIUM CHLORIDE 0.9 % IV BOLUS
1000.0000 mL | Freq: Once | INTRAVENOUS | Status: AC
  Administered 2020-08-30: 1000 mL via INTRAVENOUS

## 2020-08-30 NOTE — ED Triage Notes (Signed)
Patient states she had a syncopal episode tonight at home, witnessed by husband. Patient unsure if she hit her head, two front upper teeth chipped from event. Patient denies history of seizures, states she takes medicine for her BP. Patient is AxOx4, able to answer all questions coherently.

## 2020-08-30 NOTE — ED Provider Notes (Signed)
[facility] Emergency Department Provider Note  ____________________________________________  Time seen: Approximately 1:17 AM  I have reviewed the triage vital signs and the nursing notes.   HISTORY  Chief Complaint Loss of Consciousness    HPI Cheyenne Lopez is a 70 y.o. female with a history of hypertension hypothyroidism and type 1 diabetes with insulin pump who reports being in her usual state of health this evening, sitting on the couch when she started to have nausea and malaise.  She got up to walk to the bathroom, and then subsequently passed out and fell.  She chipped the veneers on 2 front upper teeth, but denies headache or neck pain or other apparent injuries.  Denies any chest pain or shortness of breath.  No recent exertional symptoms.  No abdominal pain vomiting or diarrhea.  She does still feel nauseated.  She also notes that she has been having right shoulder pain for the past 2 days.  This is not worsened by exertion.  It is worse by arm movement and raising the arm above her head.  It is continuous and sore, moderate intensity.  Denies any injury to the arm or falls.   Patient reports she had a normal dinner consisting of cauliflower rice and salmon.  Her husband ate the same food and is feeling fine.   Past Medical History:  Diagnosis Date  . Hypertension   . Hypothyroid   . Type 1 diabetes [facility])      Patient Active Problem List   Diagnosis Date Noted  . Acute lower UTI 07/16/2019  . Hypertension complicating diabetes ([place]) 07/16/2019  . Type 1 diabetes mellitus without complication ([place]) 53/29/9242  . NSTEMI (non-ST elevated myocardial infarction) ([place]) 07/15/2019  . Chest pain 07/14/2019     Past Surgical History:  Procedure Laterality Date  . BLADDER SURGERY    . CORONARY STENT INTERVENTION N/A 07/15/2019   Procedure: CORONARY STENT INTERVENTION;  Surgeon: Yolonda Kida, MD;  Location: [place] [facility];  Service:  Cardiovascular;  Laterality: N/A;  CFX   . LEFT HEART CATH AND CORONARY ANGIOGRAPHY N/A 07/15/2019   Procedure: LEFT HEART CATH AND CORONARY ANGIOGRAPHY;  Surgeon: Dionisio David, MD;  Location: [place] [facility];  Service: Cardiovascular;  Laterality: N/A;  . NO PAST SURGERIES       Prior to Admission medications   Medication Sig Start Date End Date Taking? Authorizing Provider  ondansetron (ZOFRAN ODT) 4 MG disintegrating tablet Take 1 tablet (4 mg total) by mouth every 8 (eight) hours as needed for nausea or vomiting. 08/30/20  Yes Carrie Mew, MD  ascorbic acid (VITAMIN C) 100 MG tablet Take 100 mg by mouth daily.    [provider]  aspirin 81 MG chewable tablet Chew 1 tablet (81 mg total) by mouth daily. 07/17/19   Shahmehdi, Valeria Batman, MD  atorvastatin (LIPITOR) 80 MG tablet Take 1 tablet (80 mg total) by mouth daily. 07/16/19   Adaline Sill, NP  Biotin 10 MG TABS Take 10 mg by mouth daily.    [provider]  chlorhexidine (PERIDEX) 0.12 % solution 30 mLs by Mouth Rinse route See admin instructions. Rinse as needed for mouth sores. 02/16/19   [provider]  Coenzyme Q10 (CO Q 10 PO) Take 1 capsule by mouth daily.    [provider]  insulin detemir (LEVEMIR) 100 UNIT/ML injection Inject 18 Units into the skin See admin instructions. For use when not using the insulin pump.  12/30/18   [provider]  Insulin Human (INSULIN PUMP) SOLN Inject into the skin.    [provider]  insulin lispro (HUMALOG) 100 UNIT/ML injection Inject into the skin 3 (three) times daily before meals.    [provider]  latanoprost (XALATAN) 0.005 % ophthalmic solution 1 drop at bedtime.    [provider]  Multiple Vitamin (MULTIVITAMIN) capsule Take 1 capsule by mouth daily.    [provider]  nitroGLYCERIN (NITROSTAT) 0.4 MG SL tablet Place 1 tablet (0.4 mg total) under the tongue every 5 (five) minutes as needed for  chest pain. 07/16/19   Shahmehdi, Valeria Batman, MD  oxyCODONE-acetaminophen (PERCOCET) 5-325 MG tablet Take 1 tablet by mouth every 8 (eight) hours as needed. Patient not taking: Reported on 07/29/2019 07/16/19   Deatra James, MD  thyroid (ARMOUR) 65 MG tablet Take 65 mg by mouth daily.    [provider]  ticagrelor (BRILINTA) 90 MG TABS tablet Take 1 tablet (90 mg total) by mouth 2 (two) times daily. 07/16/19   Shahmehdi, Valeria Batman, MD  Travoprost, BAK Free, (TRAVATAN) 0.004 % SOLN ophthalmic solution Place 1 drop into both eyes at bedtime. 06/14/19   [provider]  traZODone (DESYREL) 50 MG tablet Take 25 mg by mouth at bedtime as needed. 02/26/19   [provider]  triamcinolone (NASACORT) 55 MCG/ACT AERO nasal inhaler Place 1 spray into the nose daily as needed. 08/18/09   [provider]  valsartan (DIOVAN) 40 MG tablet Take 20 mg by mouth daily.  10/27/17   [provider]  zinc sulfate (ZINC-220) 220 (50 Zn) MG capsule Take 220 mg by mouth daily.    [provider]  losartan (COZAAR) 25 MG tablet Take 25 mg by mouth daily.  02/07/19  [provider]  ranitidine (ZANTAC) 300 MG tablet  08/22/17 02/07/19  [provider]     Allergies No known allergies and Rosuvastatin   Family History  Problem Relation Age of Onset  . Alzheimer's disease Mother   . Heart failure Father   . Breast cancer Neg Hx     Social History Social History   Tobacco Use  . Smoking status: Never Smoker  . Smokeless tobacco: Never Used  Vaping Use  . Vaping Use: Never used  Substance Use Topics  . Alcohol use: No  . Drug use: No    Review of Systems  Constitutional:   No fever or chills.  ENT:   No sore throat. No rhinorrhea. Cardiovascular:   No chest pain positive syncope. Respiratory:   No dyspnea or cough. Gastrointestinal:   Negative for abdominal pain, vomiting and diarrhea.  Musculoskeletal: Right shoulder pain All other  systems reviewed and are negative except as documented above in ROS and HPI.  ____________________________________________   PHYSICAL EXAM:  VITAL SIGNS: ED Triage Vitals [08/30/20 0034]  Enc Vitals Group     BP (!) 131/48     Pulse Rate (!) 53     Resp 14     Temp 98.5 F (36.9 C)     Temp Source Oral     SpO2 98 %     Weight 146 lb 13.2 oz (66.6 kg)     Height 5' 0.75" (1.543 m)     Head Circumference      Peak Flow      Pain Score      Pain Loc      Pain Edu?  Excl. in [place]?     Vital signs reviewed, nursing assessments reviewed.   Constitutional:   Alert and oriented. Non-toxic appearance. Eyes:   Conjunctivae are normal. EOMI. PERRL. ENT      Head:   Normocephalic and atraumatic.      Nose: Normal      Mouth/Throat:   Fracture of tooth #10 with avulsion of the veneer.  Tooth #11 veneer is chipped      Neck:   No meningismus. Full ROM.  No midline spinal tenderness Hematological/Lymphatic/Immunilogical:   No cervical lymphadenopathy. Cardiovascular:   RRR. Symmetric bilateral radial and DP pulses.  No murmurs. Cap refill less than 2 seconds. Respiratory:   Normal respiratory effort without tachypnea/retractions. Breath sounds are clear and equal bilaterally. No wheezes/rales/rhonchi. Gastrointestinal:   Soft and nontender. Non distended. There is no CVA tenderness.  No rebound, rigidity, or guarding. Genitourinary:   deferred Musculoskeletal:   Normal range of motion in all extremities.  There is pain with range of motion of the right shoulder.  No bony point tenderness.  No joint effusions.  No lower extremity tenderness.  No edema. Neurologic:   Normal speech and language.  Motor grossly intact. No acute focal neurologic deficits are appreciated.  Skin:    Skin is warm, dry and intact. No rash noted.  No petechiae, purpura, or bullae.  ____________________________________________    LABS (pertinent positives/negatives) (all labs ordered are listed, but  only abnormal results are displayed) Labs Reviewed  COMPREHENSIVE METABOLIC PANEL - Abnormal; Notable for the following components:      Result Value   Sodium 133 (*)    Glucose, Bld 205 (*)    Calcium 8.0 (*)    Total Protein 5.9 (*)    All other components within normal limits  CBC WITH DIFFERENTIAL/PLATELET - Abnormal; Notable for the following components:   RBC 3.16 (*)    Hemoglobin 10.6 (*)    HCT 31.7 (*)    MCV 100.3 (*)    Neutro Abs 8.0 (*)    All other components within normal limits  LIPASE, BLOOD  TROPONIN I (HIGH SENSITIVITY)  TROPONIN I (HIGH SENSITIVITY)   ____________________________________________   EKG  Interpreted by me  Date: 08/30/2020  Rate: 55  Rhythm: normal sinus rhythm  QRS Axis: normal  Intervals: normal  ST/T Wave abnormalities: normal  Conduction Disutrbances: none  Narrative Interpretation: unremarkable      ____________________________________________    RADIOLOGY  DG Shoulder Right Portable  Result Date: 08/30/2020 CLINICAL DATA:  Right shoulder pain.  Syncopal episode. EXAM: PORTABLE RIGHT SHOULDER COMPARISON:  X-ray right shoulder 09/16/2012. FINDINGS: There is no evidence of fracture or dislocation. There is no evidence of severe arthropathy or other focal bone abnormality. Calcific tendinosis. Soft tissues are unremarkable. IMPRESSION: 1. No acute displaced fracture or dislocation. 2. Calcific tendinitis. Electronically Signed   By: Iven Finn M.D.   On: 08/30/2020 02:03    ____________________________________________   PROCEDURES Procedures  ____________________________________________  DIFFERENTIAL DIAGNOSIS   Dehydration, electrolyte abnormality, viral illness, non-STEMI, anemia, vagal episode  CLINICAL IMPRESSION / ASSESSMENT AND PLAN / ED COURSE  Medications ordered in the ED: Medications  sodium chloride 0.9 % bolus 1,000 mL (0 mLs Intravenous Stopped 08/30/20 0216)  ondansetron (ZOFRAN) injection 4 mg (4  mg Intravenous Given 08/30/20 0108)    Pertinent labs & imaging results that were available during my care of the patient were reviewed by me and considered in my medical decision making (see chart for details).  Yudit Julann Mcgilvray was evaluated in Emergency Department on 08/30/2020 for the symptoms described in the history of present illness. She was evaluated in the context of the global COVID-19 pandemic, which necessitated consideration that the patient might be at risk for infection with the SARS-CoV-2 virus that causes COVID-19. Institutional protocols and algorithms that pertain to the evaluation of patients at risk for COVID-19 are in a state of rapid change based on information released by regulatory bodies including the CDC and federal and state organizations. These policies and algorithms were followed during the patient's care in the ED.   Patient presents after nausea, syncope after standing up most likely vasovagal versus orthostatic.  Right shoulder pain appears musculoskeletal and we will get an x-ray.   Considering the patient's symptoms, medical history, and physical examination today, I have low suspicion for ACS, PE, TAD, pneumothorax, carditis, mediastinitis, pneumonia, CHF, or sepsis.  Doubt stroke or ICH.  Will check labs, give IV fluids for hydration and Zofran for nausea relief.   ----------------------------------------- 3:12 AM on 08/30/2020 -----------------------------------------  Orthostatics normal.  Vital signs normal.  Tolerating oral intake, stable for discharge     ____________________________________________   FINAL CLINICAL IMPRESSION(S) / ED DIAGNOSES    Final diagnoses:  Syncope, unspecified syncope type     ED Discharge Orders         Ordered    ondansetron (ZOFRAN ODT) 4 MG disintegrating tablet  Every 8 hours PRN        08/30/20 0311          Portions of this note were generated with dragon dictation software. Dictation errors may  occur despite best attempts at proofreading.   Carrie Mew, MD 08/30/20 [phone]

## 2020-08-30 NOTE — Discharge Instructions (Signed)
Your exam and tests today are all reassuring.  Please take Zofran as needed for nausea and follow-up with your doctor this week for continued monitoring of your symptoms.

## 2020-08-30 NOTE — ED Notes (Signed)
Pt tolerating water.  

## 2020-09-21 ENCOUNTER — Other Ambulatory Visit: Payer: Self-pay | Admitting: Neurology

## 2020-09-21 DIAGNOSIS — R569 Unspecified convulsions: Secondary | ICD-10-CM

## 2020-10-03 ENCOUNTER — Other Ambulatory Visit: Payer: Self-pay

## 2020-10-03 ENCOUNTER — Ambulatory Visit
Admission: RE | Admit: 2020-10-03 | Discharge: 2020-10-03 | Disposition: A | Discharge status: Home / Self Care | Payer: 59 | Source: Ambulatory Visit | Attending: Neurology | Admitting: Neurology

## 2020-10-03 DIAGNOSIS — R569 Unspecified convulsions: Secondary | ICD-10-CM | POA: Insufficient documentation

## 2020-10-03 MED ORDER — GADOBUTROL 1 MMOL/ML IV SOLN
5.0000 mL | Freq: Once | INTRAVENOUS | Status: AC | PRN
  Administered 2020-10-03: 5 mL via INTRAVENOUS

## 2020-12-06 ENCOUNTER — Other Ambulatory Visit: Payer: Self-pay | Admitting: Student

## 2020-12-06 DIAGNOSIS — R55 Syncope and collapse: Secondary | ICD-10-CM

## 2020-12-06 DIAGNOSIS — R569 Unspecified convulsions: Secondary | ICD-10-CM

## 2020-12-21 ENCOUNTER — Ambulatory Visit: Payer: 59 | Attending: Student

## 2021-03-03 ENCOUNTER — Other Ambulatory Visit: Payer: Self-pay | Admitting: Family Medicine

## 2021-03-03 DIAGNOSIS — Z1231 Encounter for screening mammogram for malignant neoplasm of breast: Secondary | ICD-10-CM

## 2021-03-24 ENCOUNTER — Other Ambulatory Visit: Payer: Self-pay

## 2021-03-24 ENCOUNTER — Ambulatory Visit
Admission: RE | Admit: 2021-03-24 | Discharge: 2021-03-24 | Disposition: A | Discharge status: Home / Self Care | Payer: 59 | Source: Ambulatory Visit | Attending: Family Medicine | Admitting: Family Medicine

## 2021-03-24 DIAGNOSIS — Z1231 Encounter for screening mammogram for malignant neoplasm of breast: Secondary | ICD-10-CM | POA: Insufficient documentation

## 2021-08-03 IMAGING — MR MR HEAD WO/W CM
11 of 15 series · 35 of 48 positions shown · IV contrast (gadavist)
Comparison: No pertinent prior exams available for comparison.

CLINICAL DATA: Seizure-like activity. Additional history provided
by scanning technologist: Patient reports "fainting [place]/passing
out" a few weeks ago.

EXAM:
MRI HEAD WITHOUT AND WITH CONTRAST
TECHNIQUE: Multiplanar, multiecho pulse sequences of the brain and surrounding
structures were obtained without and with intravenous contrast.
CONTRAST:  5mL GADAVIST GADOBUTROL 1 MMOL/ML IV SOLN

[Series 5: ax dwi_tracew · axial · 3.0mm · 0.65mm/px · z∈[-89,+63]mm · 4 of 96 slices shown]
[im 1/96]
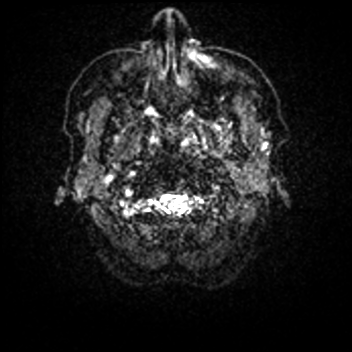
[im 32/96]
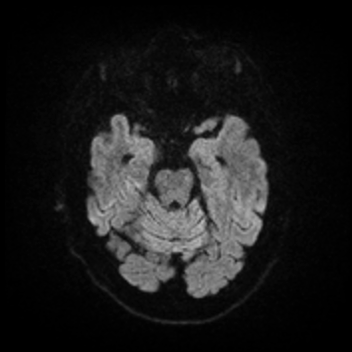
[im 64/96]
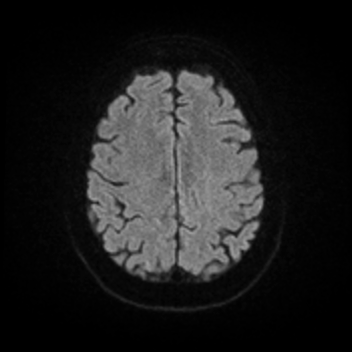
[im 96/96]
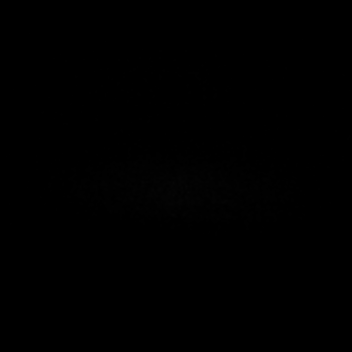

[Series 6: ax dwi_adc · axial · 3.0mm · 0.65mm/px · 1 of 43 slices shown]
[im 1/43]
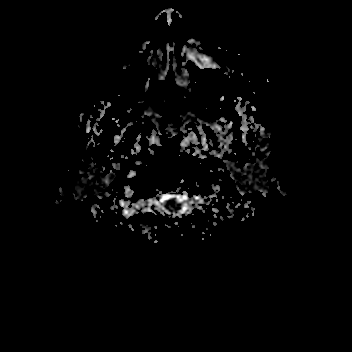

[Series 7: T1 · sagittal · 5.0mm · 0.62mm/px · 1 of 20 slices shown (1 of 2)]
[im 1/20]
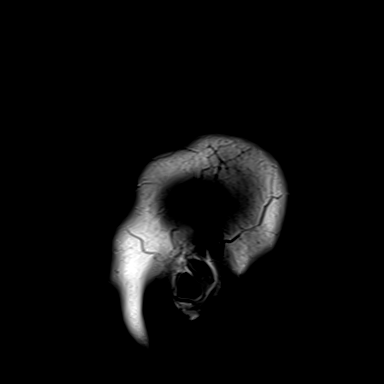

[Series 8: cor dwi_tracew · coronal · 5.0mm · 0.60mm/px · 3 of 76 slices shown]
[im 1/76]
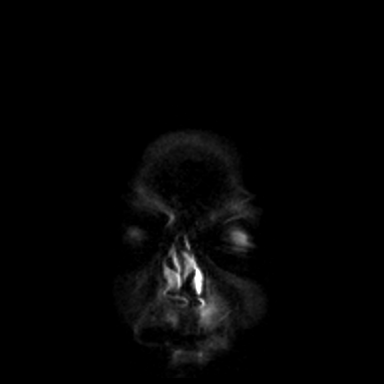
[im 26/76]
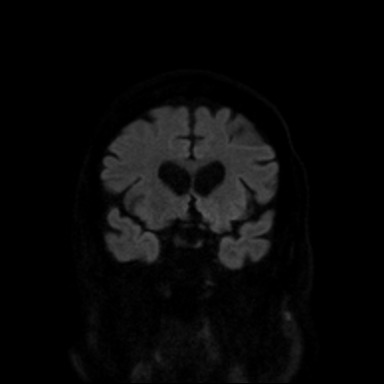
[im 51/76]
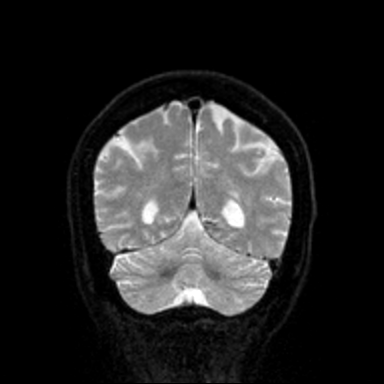

[Series 10: T2 · axial · 5.0mm · 0.53mm/px · 1 of 25 slices shown (1 of 2)]
[im 1/25]
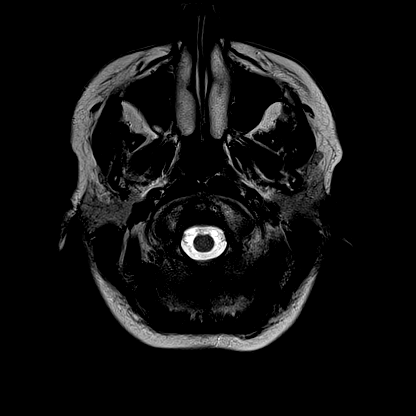

[Series 15: FLAIR · axial · 3.0mm · 0.53mm/px · z∈[-91,+68]mm · 3 of 55 slices shown (1 of 2)]
[im 1/55]
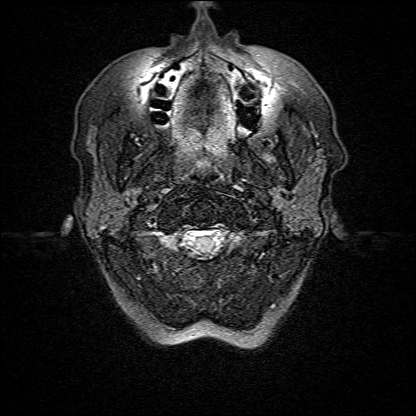
[im 28/55]
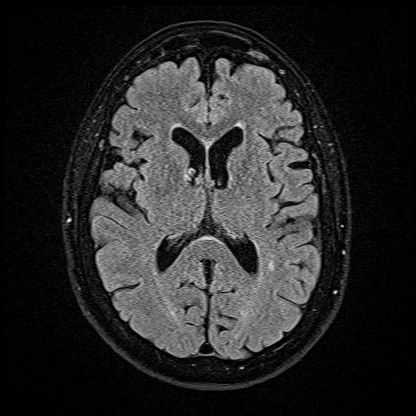
[im 55/55]
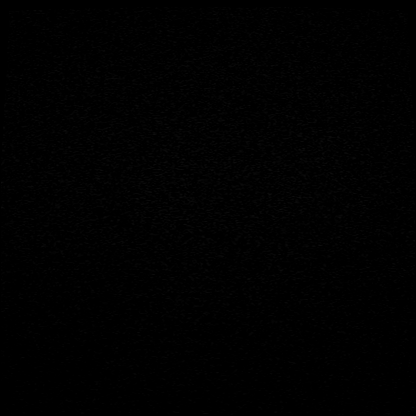

[Series 16: T1 · axial · 1.0mm · 0.98mm/px · z∈[-101,+71]mm · 8 of 175 slices shown (2 of 2)]
[im 1/175]
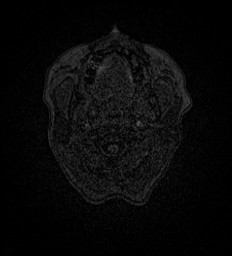
[im 22/175]
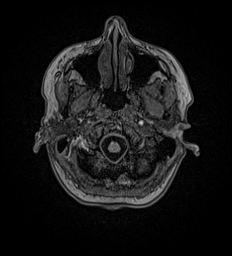
[im 44/175]
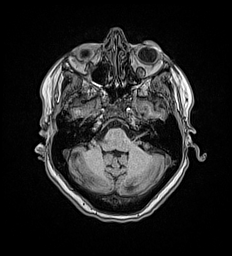
[im 66/175]
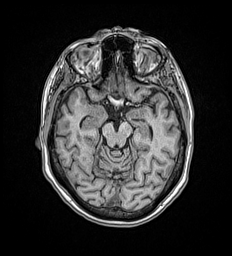
[im 109/175]
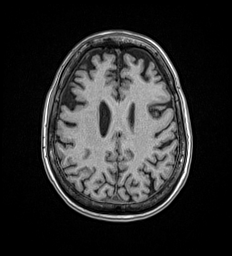
[im 131/175]
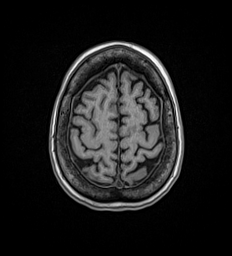
[im 153/175]
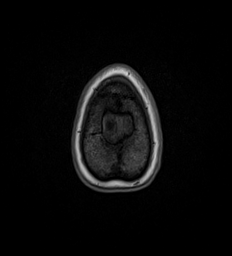
[im 175/175]
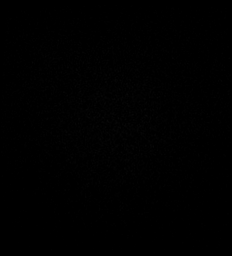

[Series 17: T2 · coronal · 3.0mm · 0.23mm/px · 2 of 35 slices shown (2 of 2)]
[im 1/35]
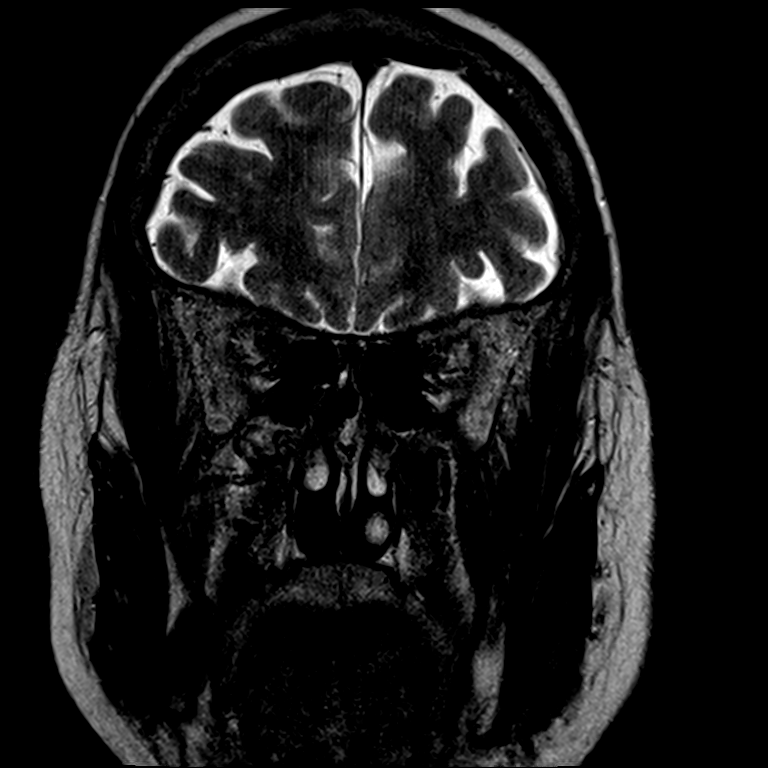
[im 35/35]
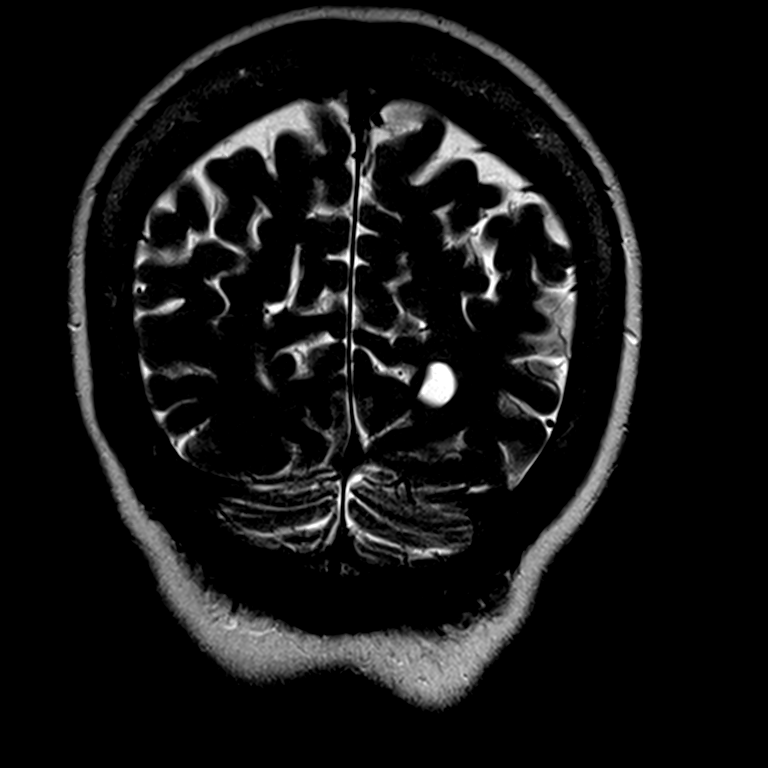

[Series 18: FLAIR · coronal · 3.0mm · 0.35mm/px · 2 of 35 slices shown (2 of 2)]
[im 1/35]
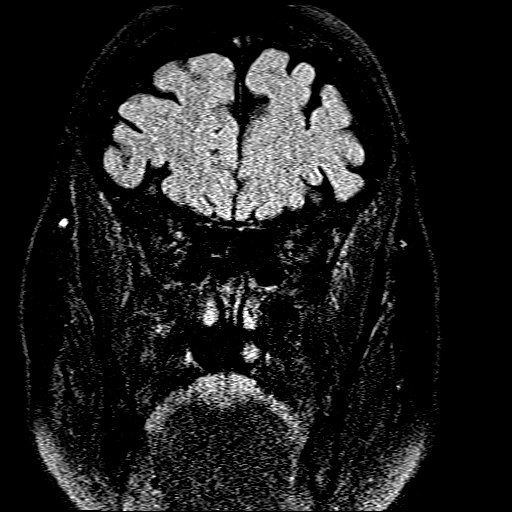
[im 35/35]
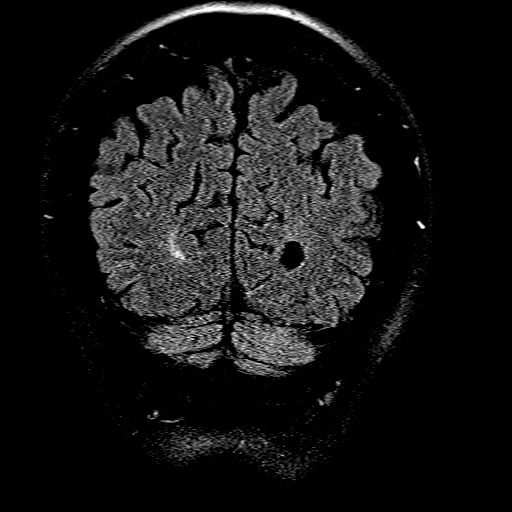

[Series 19: T1 post-contrast · axial · 1.0mm · 0.98mm/px · z∈[-101,+71]mm · 9 of 176 slices shown (1 of 2)]
[im 1/176]
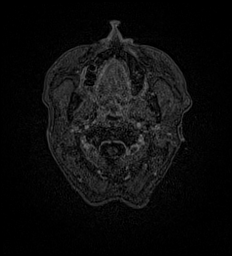
[im 22/176]
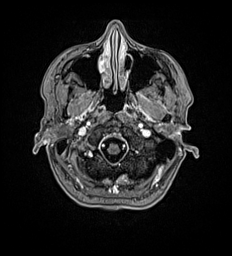
[im 44/176]
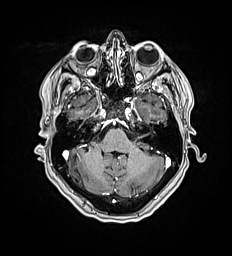
[im 66/176]
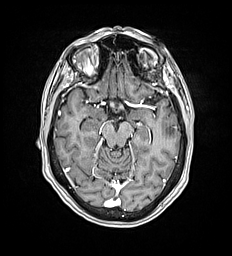
[im 88/176]
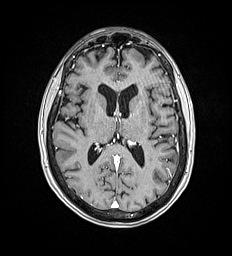
[im 110/176]
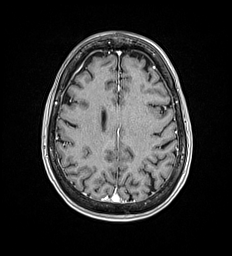
[im 132/176]
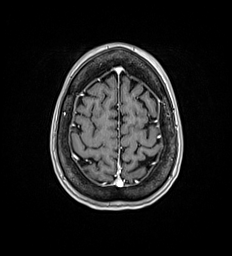
[im 154/176]
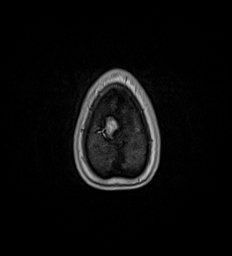
[im 176/176]
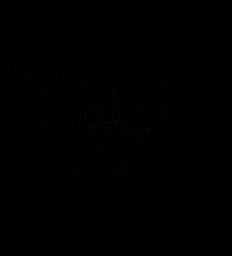

[Series 20: T1 post-contrast · coronal · 5.0mm · 0.57mm/px · 1 of 29 slices shown (2 of 2)]
[im 1/29]
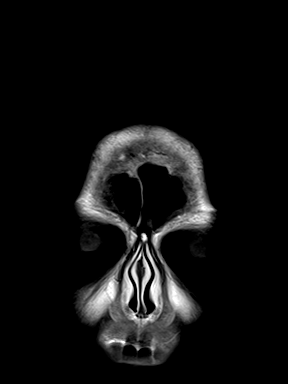

[35 of 48 positions shown; findings below may reference images not displayed]

FINDINGS: Brain:

Mild generalized cerebral and cerebellar atrophy.

Small chronic cortical/subcortical infarct within the left parietal
lobe (with involvement of the left postcentral gyrus (series 15,
images 38-40).

Mild multifocal T2/FLAIR hyperintensity elsewhere within the
cerebral white matter, nonspecific but compatible with chronic small
vessel ischemic disease.

Small chronic infarcts within the bilateral cerebral hemispheres
(for instance as seen on series 10, image 6).

The hippocampi are symmetric in size and signal.

There is no acute infarct.

No evidence of an intracranial mass.

No chronic intracranial blood products.

No extra-axial fluid collection.

No midline shift.

Partially empty sella turcica.

No abnormal intracranial enhancement.

Vascular: Expected proximal arterial flow voids.

Skull and upper cervical spine: No focal marrow lesion.

Sinuses/Orbits: Visualized orbits show no acute finding. Small right
maxillary sinus mucous retention cyst. Trace bilateral ethmoid sinus
mucosal thickening.
IMPRESSION: No evidence of acute intracranial abnormality.

Small chronic cortical/subcortical infarct within the left parietal
lobe (with involvement of the left postcentral gyrus).

Mild chronic small vessel ischemic changes within the cerebral white
matter.

Small chronic infarcts within the bilateral cerebellar hemispheres.

Mild generalized parenchymal atrophy.

## 2022-01-17 ENCOUNTER — Other Ambulatory Visit: Payer: Self-pay | Admitting: General Surgery

## 2022-01-18 ENCOUNTER — Other Ambulatory Visit: Payer: Self-pay | Admitting: Family Medicine

## 2022-01-18 DIAGNOSIS — Z780 Asymptomatic menopausal state: Secondary | ICD-10-CM

## 2022-01-18 DIAGNOSIS — Z1231 Encounter for screening mammogram for malignant neoplasm of breast: Secondary | ICD-10-CM

## 2022-02-17 ENCOUNTER — Other Ambulatory Visit: Payer: Self-pay | Admitting: Specialist

## 2022-02-17 NOTE — H&P (Signed)
PREOPERATIVE H&P  Chief Complaint: G56.01 Carpal tunnel syndrome, right upper limb  HPI: Cheyenne Lopez is a 71 y.o. female who presents for preoperative history and physical with a diagnosis of G56.01 Carpal tunnel syndrome, right upper limb. Symptoms are rated as moderate to severe, and have been worsening.  She has failed conservative treatment with medications, injections, and splinting.  This is significantly impairing activities of daily living.  She has elected for surgical management.   Past Medical History:  Diagnosis Date   Hypertension    Hypothyroid    Type 1 diabetes ([place])    Past Surgical History:  Procedure Laterality Date   BLADDER SURGERY     CORONARY STENT INTERVENTION N/A 07/15/2019   Procedure: CORONARY STENT INTERVENTION;  Surgeon: Yolonda Kida, MD;  Location: [place] [facility];  Service: Cardiovascular;  Laterality: N/A;  CFX    LEFT HEART CATH AND CORONARY ANGIOGRAPHY N/A 07/15/2019   Procedure: LEFT HEART CATH AND CORONARY ANGIOGRAPHY;  Surgeon: Dionisio David, MD;  Location: [place] [facility];  Service: Cardiovascular;  Laterality: N/A;   NO PAST SURGERIES     Social History   Socioeconomic History   Marital status: Married    Spouse name: Not on file   Number of children: Not on file   Years of education: Not on file   Highest education level: Not on file  Occupational History   Not on file  Tobacco Use   Smoking status: Never   Smokeless tobacco: Never  Vaping Use   Vaping Use: Never used  Substance and Sexual Activity   Alcohol use: No   Drug use: No   Sexual activity: Yes    Birth control/protection: None    Comment: Married  Other Topics Concern   Not on file  Social History Narrative   Not on file   Social Determinants of Health   Financial Resource Strain: Not on file  Food Insecurity: Not on file  Transportation Needs: Not on file  Physical Activity: Not on file  Stress: Not on file  Social Connections: Not on  file   Family History  Problem Relation Age of Onset   Alzheimer's disease Mother    Heart failure Father    Breast cancer Neg Hx    Allergies  Allergen Reactions   No Known Allergies    Rosuvastatin Other (See Comments)    Patient stated she could not walk when on this medication.   Prior to Admission medications   Medication Sig Start Date End Date Taking? Authorizing Provider  ascorbic acid (VITAMIN C) 100 MG tablet Take 100 mg by mouth daily.    [provider]  aspirin 81 MG chewable tablet Chew 1 tablet (81 mg total) by mouth daily. 07/17/19   Shahmehdi, Valeria Batman, MD  atorvastatin (LIPITOR) 80 MG tablet Take 1 tablet (80 mg total) by mouth daily. 07/16/19   Adaline Sill, NP  Biotin 10 MG TABS Take 10 mg by mouth daily.    [provider]  chlorhexidine (PERIDEX) 0.12 % solution 30 mLs by Mouth Rinse route See admin instructions. Rinse as needed for mouth sores. 02/16/19   [provider]  Coenzyme Q10 (CO Q 10 PO) Take 1 capsule by mouth daily.    [provider]  insulin detemir (LEVEMIR) 100 UNIT/ML injection Inject 18 Units into the skin See admin instructions. For use when not using the insulin pump. 12/30/18   [provider]  Insulin Human (INSULIN  PUMP) SOLN Inject into the skin.    [provider]  insulin lispro (HUMALOG) 100 UNIT/ML injection Inject into the skin 3 (three) times daily before meals.    [provider]  latanoprost (XALATAN) 0.005 % ophthalmic solution 1 drop at bedtime.    [provider]  Multiple Vitamin (MULTIVITAMIN) capsule Take 1 capsule by mouth daily.    [provider]  nitroGLYCERIN (NITROSTAT) 0.4 MG SL tablet Place 1 tablet (0.4 mg total) under the tongue every 5 (five) minutes as needed for chest pain. 07/16/19   Shahmehdi, Valeria Batman, MD  ondansetron (ZOFRAN ODT) 4 MG disintegrating tablet Take 1 tablet (4 mg total) by mouth every 8 (eight) hours as needed for nausea  or vomiting. 08/30/20   Carrie Mew, MD  oxyCODONE-acetaminophen (PERCOCET) 5-325 MG tablet Take 1 tablet by mouth every 8 (eight) hours as needed. Patient not taking: Reported on 07/29/2019 07/16/19   Deatra James, MD  thyroid (ARMOUR) 65 MG tablet Take 65 mg by mouth daily.    [provider]  ticagrelor (BRILINTA) 90 MG TABS tablet Take 1 tablet (90 mg total) by mouth 2 (two) times daily. 07/16/19   Shahmehdi, Valeria Batman, MD  Travoprost, BAK Free, (TRAVATAN) 0.004 % SOLN ophthalmic solution Place 1 drop into both eyes at bedtime. 06/14/19   [provider]  traZODone (DESYREL) 50 MG tablet Take 25 mg by mouth at bedtime as needed. 02/26/19   [provider]  triamcinolone (NASACORT) 55 MCG/ACT AERO nasal inhaler Place 1 spray into the nose daily as needed. 08/18/09   [provider]  valsartan (DIOVAN) 40 MG tablet Take 20 mg by mouth daily.  10/27/17   [provider]  zinc sulfate (ZINC-220) 220 (50 Zn) MG capsule Take 220 mg by mouth daily.    [provider]  losartan (COZAAR) 25 MG tablet Take 25 mg by mouth daily.  02/07/19  [provider]  ranitidine (ZANTAC) 300 MG tablet  08/22/17 02/07/19  [provider]     Positive ROS: All other systems have been reviewed and were otherwise negative with the exception of those mentioned in the HPI and as above.  Physical Exam: General: Alert, no acute distress Cardiovascular: No pedal edema. Heart is regular and without murmur.  Respiratory: No cyanosis, no use of accessory musculature. Lungs are clear. GI: No organomegaly, abdomen is soft and non-tender Skin: No lesions in the area of chief complaint Neurologic: Sensation intact distally Psychiatric: Patient is competent for consent with normal mood and affect Lymphatic: No axillary or cervical lymphadenopathy  MUSCULOSKELETAL: Positive median nerve compression on the right.  There is some thenar wasting.  Pinch is  weak.  Skin is intact.  Gross sensation is slightly decreased.  Assessment: G56.01 Carpal tunnel syndrome, right upper limb  Plan: Plan for Procedure(s): CARPAL TUNNEL RELEASE  The risks benefits and alternatives were discussed with the patient including but not limited to the risks of nonoperative treatment, versus surgical intervention including infection, bleeding, nerve injury,  blood clots, cardiopulmonary complications, morbidity, mortality, among others, and they were willing to proceed.   Park Breed, MD [phone]   02/17/2022 1:10 PM

## 2022-02-19 ENCOUNTER — Encounter
Admission: RE | Admit: 2022-02-19 | Discharge: 2022-02-19 | Disposition: A | Discharge status: Home / Self Care | Payer: 59 | Source: Ambulatory Visit | Attending: Specialist | Admitting: Specialist

## 2022-02-19 VITALS — Ht 61.0 in | Wt 122.6 lb

## 2022-02-19 DIAGNOSIS — Z01818 Encounter for other preprocedural examination: Secondary | ICD-10-CM

## 2022-02-19 DIAGNOSIS — I498 Other specified cardiac arrhythmias: Secondary | ICD-10-CM | POA: Insufficient documentation

## 2022-02-19 DIAGNOSIS — E109 Type 1 diabetes mellitus without complications: Secondary | ICD-10-CM | POA: Insufficient documentation

## 2022-02-19 DIAGNOSIS — I214 Non-ST elevation (NSTEMI) myocardial infarction: Secondary | ICD-10-CM | POA: Diagnosis not present

## 2022-02-19 DIAGNOSIS — I152 Hypertension secondary to endocrine disorders: Secondary | ICD-10-CM | POA: Diagnosis not present

## 2022-02-19 DIAGNOSIS — Z01810 Encounter for preprocedural cardiovascular examination: Secondary | ICD-10-CM | POA: Diagnosis present

## 2022-02-19 HISTORY — DX: Type 1 diabetes mellitus without complications: E10.9

## 2022-02-19 HISTORY — DX: Presence of insulin pump (external) (internal): Z96.41

## 2022-02-19 HISTORY — DX: Transient cerebral ischemic attack, unspecified: G45.9

## 2022-02-19 HISTORY — DX: Gastro-esophageal reflux disease without esophagitis: K21.9

## 2022-02-19 HISTORY — DX: Nausea with vomiting, unspecified: R11.2

## 2022-02-19 HISTORY — DX: Unspecified glaucoma: H40.9

## 2022-02-19 HISTORY — DX: Other complications of anesthesia, initial encounter: T88.59XA

## 2022-02-19 HISTORY — DX: Obstructive sleep apnea (adult) (pediatric): G47.33

## 2022-02-19 HISTORY — DX: Atherosclerotic heart disease of native coronary artery without angina pectoris: I25.10

## 2022-02-19 HISTORY — DX: Other specified postprocedural states: Z98.890

## 2022-02-19 NOTE — Pre-Procedure Instructions (Signed)
Called Cheyenne Lopez coordinator to let her know pt with insulin pump (Omnipod 5) having ctr on 03-01-22. Informed her pt is in the OR for 55 minutes. Pt instructed to decrease basal rate 20% at midnight night prior to procedure

## 2022-02-19 NOTE — Patient Instructions (Addendum)
Your procedure is scheduled on:03-01-22 Thursday Report to the Registration Desk on the 1st floor of the [facility].Then proceed to the 2nd floor Surgery Desk  To find out your arrival time, please call [phone] between 1PM - 3PM on:02-28-22 Wednesday If your arrival time is 6:00 am, do not arrive prior to that time as the [facility] entrance doors do not open until 6:00 am.  REMEMBER: Instructions that are not followed completely may result in serious medical risk, up to and including death; or upon the discretion of your surgeon and anesthesiologist your surgery may need to be rescheduled.  Do not eat food OR drink any liquids after midnight the night before surgery.  No gum chewing, lozengers or hard candies.  TAKE THESE MEDICATIONS THE MORNING OF SURGERY WITH A SIP OF WATER: -thyroid (ARMOUR)   Call Dr Sanjuan Dame office today (02-19-22) to see when you need to stop your 81 mg Aspirin  Decrease your Basal Rate 20% on your Insulin Pump after midnight the night prior to your surgery (02-28-22) Wednesday night  One week prior to surgery: Stop Anti-inflammatories (NSAIDS) such as Advil, Aleve, Ibuprofen, Motrin, Naproxen, Naprosyn and Aspirin based products such as Excedrin, Goodys Powder, BC Powder.You may however, continue to take Tylenol if needed for pain up until the day of surgery.  Stop ANY OVER THE COUNTER supplements/vitamins 7 days prior to surgery (Calcium-Magnesium, Vitamin B12-Folic Acid, Collagen and Multivitamin)  No Alcohol for 24 hours before or after surgery.  No Smoking including e-cigarettes for 24 hours prior to surgery.  No chewable tobacco products for at least 6 hours prior to surgery.  No nicotine patches on the day of surgery.  Do not use any "recreational" drugs for at least a week prior to your surgery.  Please be advised that the combination of cocaine and anesthesia may have negative outcomes, up to and including death. If you test positive for cocaine,  your surgery will be cancelled.  On the morning of surgery brush your teeth with toothpaste and water, you may rinse your mouth with mouthwash if you wish. Do not swallow any toothpaste or mouthwash.  Use CHG Soap as directed on instruction sheet.  Bring your C-pap machine with you to the hospital  Do not wear jewelry, make-up, hairpins, clips or nail polish.  Do not wear lotions, powders, or perfumes.   Do not shave body from the neck down 48 hours prior to surgery just in case you cut yourself which could leave a site for infection.  Also, freshly shaved skin may become irritated if using the CHG soap.  Contact lenses, hearing aids and dentures may not be worn into surgery.  Do not bring valuables to the hospital. [facility] is not responsible for any missing/lost belongings or valuables.   Notify your doctor if there is any change in your medical condition (cold, fever, infection).  Wear comfortable clothing (specific to your surgery type) to the hospital.  After surgery, you can help prevent lung complications by doing breathing exercises.  Take deep breaths and cough every 1-2 hours. Your doctor may order a device called an Incentive Spirometer to help you take deep breaths. When coughing or sneezing, hold a pillow firmly against your incision with both hands. This is called "splinting." Doing this helps protect your incision. It also decreases belly discomfort.  If you are being admitted to the hospital overnight, leave your suitcase in the car. After surgery it may be brought to your room.  If  you are being discharged the day of surgery, you will not be allowed to drive home. You will need a responsible adult (18 years or older) to drive you home and stay with you that night.   If you are taking public transportation, you will need to have a responsible adult (18 years or older) with you. Please confirm with your physician that it is acceptable to use public transportation.    Please call the [place] Dept. at [phone] if you have any questions about these instructions.  Surgery Visitation Policy:  Patients undergoing a surgery or procedure may have two family members or support persons with them as long as the person is not COVID-19 positive or experiencing its symptoms.   MASKING: Due to an increase in RSV rates and hospitalizations, starting Wednesday, Nov. 15, in patient care areas in which we serve newborns, infants and children, masks will be required for teammates and visitors.  Children ages 49 and under may not visit. This policy affects the following departments only:  [place] Postpartum area Mother Baby Unit Newborn nursery/Special care nursery  Other areas: Masks continue to be strongly recommended for [place] teammates, visitors and patients in all other areas. Visitation is not restricted outside of the units listed above.

## 2022-02-23 ENCOUNTER — Encounter: Payer: Self-pay | Admitting: Specialist

## 2022-02-23 NOTE — Progress Notes (Signed)
Perioperative Services  Pre-Admission/Anesthesia Testing Clinical Review  Date: 02/27/22  Patient Demographics:  Name: Elmira Olkowski DOB:   May 07, 1950 MRN:   [id number]  Planned Surgical Procedure(s):    Case: [id number] Date/Time: 03/01/22 1020   Procedure: CARPAL TUNNEL RELEASE (Right)   Anesthesia type: General   Pre-op diagnosis: G56.01 Carpal tunnel syndrome, right upper limb   Location: [facility] OR ROOM 09 / [place] ORS FOR ANESTHESIA GROUP   Surgeons: Earnestine Leys, MD   NOTE: Available PAT nursing documentation and vital signs have been reviewed. Clinical nursing staff has updated patient's PMH/PSHx, current medication list, and drug allergies/intolerances to ensure comprehensive history available to assist in medical decision making as it pertains to the aforementioned surgical procedure and anticipated anesthetic course. Extensive review of available clinical information performed. [place] PMH and PSHx updated with any diagnoses/procedures that  may have been inadvertently omitted during her intake with the pre-admission testing department's nursing staff.  Clinical Discussion:  Maricsa Sammons is a 71 y.o. female who is submitted for pre-surgical anesthesia review and clearance prior to her undergoing the above procedure. Patient has never been a smoker. Pertinent PMH includes: CAD, NSTEMI, diastolic dysfunction, CVA, aortic atherosclerosis, HTN, HLD, T1DM, hypothyroidism, GERD (no daily Tx), OSAH (requires nocturnal PAP therapy), DOE, carpal tunnel syndrome, glaucoma.  Patient is followed by cardiology Clayborn Bigness, MD). She was last seen in the cardiology clinic on 01/03/2022; notes reviewed.  At the time of her clinic visit, patient reporting episodes of exertional dyspnea with associated chest tightness.  She denies any PND, orthopnea, palpitations, significant peripheral edema, or vertiginous symptoms.  Patient with reports of episodes of syncope associated with "mini  strokes".  Patient has been seen in consult by neurology.  Syncopal episodes felt to be vasovagal in nature.  Patient with past medical history significant for cardiovascular diagnoses.  Patient suffered an NSTEMI on 07/14/2019.  Troponins were trended: 23 --> 145 --> 11,794, --> 12,794 ng/L.  Diagnostic LEFT heart catheterization was performed revealing 100% occlusion of the proximal RCA, 40% mid LAD, and 85% ostial to proximal LCx.  Subsequent PCI was performed.  Interventional notes that proximal RCA lesion was chronic; unable to cross wire.  2.5 x 22 mm resolute Onyx DES placed to the ostial to proximal LCx lesion yielding excellent angiographic result and TIMI-3 flow.  TTE performed on 07/15/2019 revealed a normal left ventricular systolic function with an EF of 60-65%.  There was mid and distal lateral wall hypokinesis. Left ventricular diastolic Doppler parameters consistent with abnormal relaxation (G1DD).  There was no significant valvular regurgitation.  All transvalvular gradients were noted to be normal with no evidence of valvular stenosis.  Patient with remote cerebral infarcts noted on MRI imaging of her brain performed on 10/03/2020.  Small chronic cortical/subcortical infarct within the LEFT parietal lobe (postcentral gyrus involved).  Additionally, there were small chronic infarcts within the BILATERAL cerebellar hemispheres noted.  Myocardial perfusion imaging study performed on 02/13/2021 revealed a normal left ventricular systolic function with an EF of 59%.  There was normal regional wall motion and wall thickening.  SPECT images demonstrated no evidence of stress-induced myocardial ischemia or arrhythmia.  Study determined to be normal and low risk.  Most recent TTE performed on 02/14/2021 revealed normal left ventricular systolic function with EF of >55%.  Right ventricular size and function normal.  There was trivial mitral and tricuspid valve regurgitation.  All transvalvular  gradients were normal with no evidence of valvular stenosis.  Blood pressure mildly  elevated 146/70 mmHg on currently prescribed ARB (valsartan) monotherapy.  Patient does not take any type of lipid-lowering therapies for her HLD diagnosis and further ASCVD prevention.  Patient has a supply of short acting nitrates (NTG) to use on a as needed basis for recurrent angina/anginal equivalent symptoms; denied recent use.  T2DM well-controlled on currently prescribed regimen; last HgbA1c was 6.7% when checked on 11/24/2021.  Patient has been retested since she has seen cardiology with even further reduction to 6.4% on 02/23/2022. OSAH (requires nocturnal PAP therapy) and is reportedly compliant with prescribed nocturnal PAP therapy. Patient maintains an active lifestyle and has a formal daily exercise regimen that includes walking and use of a treadmill. Functional capacity, as defined by DASI, is documented as being >/= 4 METS. No changes were made to her medication regimen. Patient to follow-up with outpatient cardiology in 6 months or sooner if needed.  Leani Koryn Charlot is scheduled for an elective RIGHT CARPAL TUNNEL RELEASE on 03/01/2022 with Dr. Earnestine Leys, MD.  Given patient's past medical history significant for cardiovascular diagnoses, presurgical cardiac clearance was sought by the PAT team. Per cardiology, "this patient is optimized for surgery and may proceed with the planned procedural course with a LOW risk of significant perioperative cardiovascular complications".  In review of her medication reconciliation, it is noted that patient is currently on prescribed daily antiplatelet therapy. She has been instructed on recommendations for holding her daily low-dose ASA for 5 days prior to her procedure with plans to restart as soon as postoperative bleeding risk felt to be minimized by her attending surgeon. The patient has been instructed that her last dose of her ASA will be on 02/23/2022.  Patient  reports previous perioperative complications with anesthesia in the past. Patient has a PMH (+) for PONV. Symptoms and history of PONV will be discussed with patient by anesthesia team on the day of her procedure. Interventions will be ordered as deemed necessary based on patient's individual care needs as determined by anesthesiologist. In review of the available records, it is noted that patient underwent a MAC anesthetic course at [facility] (ASA III) in 06/2020 without documented complications.      02/19/2022    1:00 PM 08/30/2020    3:30 AM 08/30/2020    2:30 AM  Vitals with BMI  Height _0     Weight 122 lbs 9 oz    BMI 32.35    Systolic  573 220  Diastolic  50 63  Pulse  54 60    Providers/Specialists:   NOTE: Primary physician provider listed below. Patient may have been seen by APP or partner within same practice.   PROVIDER ROLE / SPECIALTY LAST Mena Goes, MD Orthopedics (Surgeon) 02/17/2022  Valera Castle, MD Primary Care Provider 01/17/2022  Katrine Coho, MD Cardiology 01/03/2022  Lindaann Slough, MD Endocrinology 11/24/2021  Jennings Books, MD Neurology 11/23/2021   Allergies:  Rosuvastatin  Current Home Medications:   No current facility-administered medications for this encounter.    aspirin 81 MG chewable tablet   Calcium-Magnesium-Vitamin D (CALCIUM MAGNESIUM PO)   chlorhexidine (PERIDEX) 0.12 % solution   Cobalamin Combinations (VITAMIN B12-FOLIC ACID PO)   COLLAGEN PO   Insulin Human (INSULIN PUMP) SOLN   insulin lispro (HUMALOG) 100 UNIT/ML injection   latanoprost (XALATAN) 0.005 % ophthalmic solution   Multiple Vitamin (MULTIVITAMIN) capsule   nitroGLYCERIN (NITROSTAT) 0.4 MG SL tablet   thyroid (ARMOUR) 15 MG tablet   traZODone (DESYREL) 50  MG tablet   triamcinolone (NASACORT) 55 MCG/ACT AERO nasal inhaler   valsartan (DIOVAN) 40 MG tablet   History:   Past Medical History:  Diagnosis Date   Adenomatous  colon polyp    Aortic atherosclerosis (HCC)    Bilateral ocular hypertension    Carpal tunnel syndrome    Complication of anesthesia    a.) PONV   Coronary artery disease 07/15/2019   a.) LHC/PCI 07/15/2019: 100% pRCA (chronic; unable to cross wire), 40% mLAD, 85% o-pLCx (2.5 x 22 mm Resolute Onyx DES)   CVA (cerebral vascular accident) ([place])    a.) MRI brain 10/03/2020: small chronic cortical/subcortical infarct within LEFT parietal lobe (postcentral gyrus involved); small chronic infarcts within BILATERAL cerebellar hemispheres   Diastolic dysfunction 71/08/2692   a.) TTE 07/15/2019 (setting of NSTEMI): EF 60-65%, mid and distal lateral wall HK, G1DD; b.) TTE 02/13/2021: EF >55%, triv MR/TR   DM (diabetes mellitus), type 1 (HCC)    DOE (dyspnea on exertion)    GERD (gastroesophageal reflux disease)    Glaucoma    Hypertension    Hypothyroidism    Insulin pump in place    a.) Omnipod 5   NSTEMI (non-ST elevated myocardial infarction) ([place]) 07/14/2019   a.) troponins were trended: 23 --> 145 --> 11,794 --> 12,794 ng/L; b.) LHC/PCI 07/15/2019: 100% pRCA (chronic; unable to cross wire), 40% mLAD, 85% o-pLCx (2.5 x 22 mm Resolute Onyx DES)   Obstructive sleep apnea on CPAP    PONV (postoperative nausea and vomiting)    Refusal of blood transfusions as patient is Jehovah's Witness    Past Surgical History:  Procedure Laterality Date   ANAL FISSURE REPAIR     BLADDER SURGERY     COLONOSCOPY     CORONARY STENT INTERVENTION N/A 07/15/2019   Procedure: CORONARY STENT INTERVENTION;  Surgeon: Yolonda Kida, MD;  Location: [facility];  Service: Cardiovascular;  Laterality: N/A;  CFX    LEFT HEART CATH AND CORONARY ANGIOGRAPHY N/A 07/15/2019   Procedure: LEFT HEART CATH AND CORONARY ANGIOGRAPHY;  Surgeon: Dionisio David, MD;  Location: [facility];  Service: Cardiovascular;  Laterality: N/A;   Family History  Problem Relation Age of Onset   Alzheimer's disease  Mother    Heart failure Father    Breast cancer Neg Hx    Social History   Tobacco Use   Smoking status: Never   Smokeless tobacco: Never  Vaping Use   Vaping Use: Never used  Substance Use Topics   Alcohol use: Yes    Comment: wine with dinner   Drug use: No    Pertinent Clinical Results:  LABS: Labs reviewed: Acceptable for surgery.    Ref Range & Units 01/17/2022  WBC (White Blood Cell Count) 3.2 - 9.8 x10^9/L 4.6  Hemoglobin 11.7 - 15.5 g/dL 12.5  Hematocrit 35.0 - 45.0 % 38.6  Platelets 150 - 450 x10^9/L 193  MCV (Mean Corpuscular Volume) 80 - 98 fL 103 High   MCH (Mean Corpuscular Hemoglobin) 26.5 - 34.0 pg 33.2  MCHC (Mean Corpuscular Hemoglobin Concentration) 31.0 - 36.0 % 32.4  RBC (Red Blood Cell Count) 3.77 - 5.16 x10^12/L 3.76 Low   RDW-CV (Red Cell Distribution Width) 11.5 - 14.5 % 12.3  NRBC (Nucleated Red Blood Cell Count) 0 x10^9/L 0.00  NRBC % (Nucleated Red Blood Cell %) % 0.0  MPV (Mean Platelet Volume) 7.2 - 11.7 fL 11.2  Resulting Agency  [facility]  Specimen Collected:  01/17/22 10:51   Performed by: [place] Last Resulted: 01/17/22 16:03  Received From: [facility]  Result Received: 01/18/22 10:17    Ref Range & Units 01/17/2022  Sodium 135 - 145 mmol/L 142  Potassium 3.5 - 5.0 mmol/L 4.3  Chloride 98 - 108 mmol/L 107  Carbon Dioxide (CO2) 21 - 30 mmol/L 26  Urea Nitrogen (BUN) 7 - 20 mg/dL 12  Creatinine 0.4 - 1.0 mg/dL 0.8  Glucose 70 - 140 mg/dL 105  Calcium 8.7 - 10.2 mg/dL 9.2  AST (Aspartate Aminotransferase) 15 - 41 U/L 21  ALT (Alanine Aminotransferase) 10 - 39 U/L 22  Bilirubin, Total 0.4 - 1.5 mg/dL 0.7  Alk Phos (Alkaline Phosphatase) 24 - 110 U/L 77  Albumin 3.5 - 4.8 g/dL 3.9  Protein, Total 6.2 - 8.1 g/dL 6.9  Anion Gap 3 - 12 mmol/L 9  BUN/CREA Ratio 6 - 27 15  Glomerular Filtration Rate (eGFR) mL/min/1.73sq m 79  Resulting Agency  [facility]  Specimen Collected: 01/17/22 10:51   Performed by: [place] Last Resulted: 01/17/22 19:31  Received From: [facility]  Result Received: 01/18/22 10:17    Ref Range & Units 11/24/2021  POC Hemoglobin A1C (Glycated) 4.3 - 6.0 % 6.7 High   Average Blood Glucose mg/dL 146  Resulting Agency  [facility]    Date: 02/19/2022 Time ECG obtained: 1328 PM Rate: 57 bpm Rhythm:  Sinus bradycardia with sinus arrhythmia Axis (leads I and aVF): Normal Intervals: PR 158 ms. QRS 78 ms. QTc 430 ms. ST segment and T wave changes: No evidence of acute ST segment elevation or depression Comparison: Similar to previous tracing obtained on 08/30/2020   IMAGING / PROCEDURES: MYOCARDIAL PERFUSION IMAGING STUDY (LEXISCAN) performed on 02/13/2021 Normal left ventricular systolic function with a normal LVEF of 59% Normal myocardial thickening and wall motion Left ventricular cavity size normal SPECT images demonstrate homogenous tracer distribution throughout the myocardium No evidence of stress-induced myocardial ischemia or arrhythmia Normal low risk study  TRANSTHORACIC ECHOCARDIOGRAM performed on 02/13/2021 Normal left ventricular systolic function with an EF of >55% Normal left ventricular diastolic Doppler parameters Normal right ventricular systolic function Trivial MR/TR Normal transvalvular gradients; no valvular stenosis No pericardial effusion  MR BRAIN W WO CONTRAST performed on 10/03/2020 No evidence of acute intracranial abnormality. Small chronic cortical/subcortical infarct within the left parietal lobe (with involvement of the left postcentral gyrus). Mild chronic small vessel ischemic changes within the cerebral white matter. Small chronic infarcts within the bilateral cerebellar hemispheres. Mild generalized parenchymal atrophy.  LEFT HEART CATHETERIZATION AND CORONARY ANGIOGRAPHY performed on  07/15/2019 Normal left ventricular systolic function with an EF of 60% Mild inferior hypokinesis Multivessel CAD 40% mid LAD 85% ostial to proximal LCx 100% proximal RCA Unsuccessful attempt to cross wire at CTO of chronic RCA Successful PCI 2.5 x 22 mm resolute Onyx DES to LCx    Impression and Plan:  Yetta Marceaux has been referred for pre-anesthesia review and clearance prior to her undergoing the planned anesthetic and procedural courses. Available labs, pertinent testing, and imaging results were personally reviewed by me. This patient has been appropriately cleared by cardiology with an overall LOW risk of significant perioperative cardiovascular complications.  Based on clinical review performed today (02/27/22), barring any significant acute changes in the patient's overall condition, it is anticipated that she will be able to proceed with the planned surgical intervention. Any acute  changes in clinical condition may necessitate her procedure being postponed and/or cancelled. Patient will meet with anesthesia team (MD and/or CRNA) on the day of her procedure for preoperative evaluation/assessment. Questions regarding anesthetic course will be fielded at that time.   Pre-surgical instructions were reviewed with the patient during her PAT appointment and questions were fielded by PAT clinical staff. Patient was advised that if any questions or concerns arise prior to her procedure then she should return a call to PAT and/or her surgeon's office to discuss.  Honor Loh, MSN, APRN, FNP-C, CEN [facility]  Peri-operative Services Nurse Practitioner Phone: [phone] Fax: [phone] 02/27/22 9:04 AM  NOTE: This note has been prepared using Dragon dictation software. Despite my best ability to proofread, there is always the potential that unintentional transcriptional errors may still occur from this process.

## 2022-02-28 MED ORDER — GABAPENTIN 300 MG PO CAPS: 300.0000 mg | ORAL_CAPSULE | ORAL | Status: AC

## 2022-02-28 MED ORDER — MELOXICAM 7.5 MG PO TABS: 15.0000 mg | ORAL_TABLET | Freq: Every day | ORAL | Status: DC

## 2022-02-28 MED ORDER — CHLORHEXIDINE GLUCONATE 0.12 % MT SOLN: 15.0000 mL | Freq: Once | OROMUCOSAL | Status: AC

## 2022-02-28 MED ORDER — CEFAZOLIN SODIUM-DEXTROSE 2-4 GM/100ML-% IV SOLN
2.0000 g | INTRAVENOUS | Status: AC
  Administered 2022-03-01: 2 g via INTRAVENOUS

## 2022-02-28 MED ORDER — FAMOTIDINE 20 MG PO TABS: 20.0000 mg | ORAL_TABLET | Freq: Once | ORAL | Status: AC

## 2022-02-28 MED ORDER — CHLORHEXIDINE GLUCONATE CLOTH 2 % EX PADS: 6.0000 | MEDICATED_PAD | Freq: Once | CUTANEOUS | Status: DC

## 2022-02-28 MED ORDER — ORAL CARE MOUTH RINSE: 15.0000 mL | Freq: Once | OROMUCOSAL | Status: AC

## 2022-03-01 ENCOUNTER — Ambulatory Visit: Payer: 59 | Admitting: Urgent Care

## 2022-03-01 ENCOUNTER — Other Ambulatory Visit: Payer: Self-pay

## 2022-03-01 ENCOUNTER — Encounter
Admission: RE | Disposition: A | Discharge status: Home / Self Care | Payer: Self-pay | Source: Home / Self Care | Attending: Specialist

## 2022-03-01 ENCOUNTER — Encounter: Payer: Self-pay | Admitting: Specialist

## 2022-03-01 ENCOUNTER — Ambulatory Visit
Admission: RE | Admit: 2022-03-01 | Discharge: 2022-03-01 | Disposition: A | Discharge status: Home / Self Care | Payer: 59 | Attending: Specialist | Admitting: Specialist

## 2022-03-01 DIAGNOSIS — Z01818 Encounter for other preprocedural examination: Secondary | ICD-10-CM

## 2022-03-01 DIAGNOSIS — G5601 Carpal tunnel syndrome, right upper limb: Secondary | ICD-10-CM | POA: Insufficient documentation

## 2022-03-01 DIAGNOSIS — Z794 Long term (current) use of insulin: Secondary | ICD-10-CM | POA: Insufficient documentation

## 2022-03-01 DIAGNOSIS — I10 Essential (primary) hypertension: Secondary | ICD-10-CM | POA: Diagnosis not present

## 2022-03-01 DIAGNOSIS — I252 Old myocardial infarction: Secondary | ICD-10-CM | POA: Diagnosis not present

## 2022-03-01 DIAGNOSIS — E039 Hypothyroidism, unspecified: Secondary | ICD-10-CM | POA: Insufficient documentation

## 2022-03-01 DIAGNOSIS — Z86010 Personal history of colonic polyps: Secondary | ICD-10-CM | POA: Diagnosis not present

## 2022-03-01 DIAGNOSIS — K219 Gastro-esophageal reflux disease without esophagitis: Secondary | ICD-10-CM | POA: Insufficient documentation

## 2022-03-01 DIAGNOSIS — Z9641 Presence of insulin pump (external) (internal): Secondary | ICD-10-CM | POA: Insufficient documentation

## 2022-03-01 DIAGNOSIS — Z955 Presence of coronary angioplasty implant and graft: Secondary | ICD-10-CM | POA: Insufficient documentation

## 2022-03-01 DIAGNOSIS — E109 Type 1 diabetes mellitus without complications: Secondary | ICD-10-CM | POA: Insufficient documentation

## 2022-03-01 DIAGNOSIS — Z8673 Personal history of transient ischemic attack (TIA), and cerebral infarction without residual deficits: Secondary | ICD-10-CM | POA: Diagnosis not present

## 2022-03-01 DIAGNOSIS — G4730 Sleep apnea, unspecified: Secondary | ICD-10-CM | POA: Diagnosis not present

## 2022-03-01 DIAGNOSIS — I2510 Atherosclerotic heart disease of native coronary artery without angina pectoris: Secondary | ICD-10-CM | POA: Insufficient documentation

## 2022-03-01 HISTORY — DX: Ocular hypertension, bilateral: H40.053

## 2022-03-01 HISTORY — DX: Carpal tunnel syndrome, unspecified upper limb: G56.00

## 2022-03-01 HISTORY — DX: Atherosclerosis of aorta: I70.0

## 2022-03-01 HISTORY — DX: Reserved for inherently not codable concepts without codable children: IMO0001

## 2022-03-01 HISTORY — DX: Cerebral infarction, unspecified: I63.9

## 2022-03-01 HISTORY — PX: CARPAL TUNNEL RELEASE: SHX101

## 2022-03-01 HISTORY — DX: Hypothyroidism, unspecified: E03.9

## 2022-03-01 HISTORY — DX: Benign neoplasm of colon, unspecified: D12.6

## 2022-03-01 HISTORY — DX: Other forms of dyspnea: R06.09

## 2022-03-01 HISTORY — DX: Procedure and treatment not carried out because of patient's decision for reasons of belief and group pressure: Z53.1

## 2022-03-01 LAB — GLUCOSE, CAPILLARY
Glucose-Capillary: 263 mg/dL — ABNORMAL HIGH (ref 70–99)
Glucose-Capillary: 212 mg/dL — ABNORMAL HIGH (ref 70–99)

## 2022-03-01 SURGERY — CARPAL TUNNEL RELEASE
Anesthesia: General | Laterality: Right

## 2022-03-01 MED ORDER — MIDAZOLAM HCL 2 MG/2ML IJ SOLN
INTRAMUSCULAR | Status: AC
  Filled 2022-03-01: qty 2

## 2022-03-01 MED ORDER — ACETAMINOPHEN 10 MG/ML IV SOLN
INTRAVENOUS | Status: AC
  Filled 2022-03-01: qty 100

## 2022-03-01 MED ORDER — SODIUM CHLORIDE 0.9 % IV SOLN: INTRAVENOUS | Status: DC

## 2022-03-01 MED ORDER — BUPIVACAINE HCL 0.5 % IJ SOLN
INTRAMUSCULAR | Status: DC | PRN
  Administered 2022-03-01: 17 mL

## 2022-03-01 MED ORDER — GABAPENTIN 400 MG PO CAPS: 400.0000 mg | ORAL_CAPSULE | Freq: Three times a day (TID) | ORAL | 3 refills | Status: DC

## 2022-03-01 MED ORDER — MELOXICAM 7.5 MG PO TABS
ORAL_TABLET | ORAL | Status: AC
  Administered 2022-03-01: 15 mg via ORAL
  Filled 2022-03-01: qty 2

## 2022-03-01 MED ORDER — EPHEDRINE SULFATE (PRESSORS) 50 MG/ML IJ SOLN
INTRAMUSCULAR | Status: DC | PRN
  Administered 2022-03-01: 10 mg via INTRAVENOUS

## 2022-03-01 MED ORDER — GLYCOPYRROLATE 0.2 MG/ML IJ SOLN
INTRAMUSCULAR | Status: AC
  Filled 2022-03-01: qty 1

## 2022-03-01 MED ORDER — EPHEDRINE 5 MG/ML INJ
INTRAVENOUS | Status: AC
  Filled 2022-03-01: qty 5

## 2022-03-01 MED ORDER — KETOROLAC TROMETHAMINE 30 MG/ML IJ SOLN
INTRAMUSCULAR | Status: AC
  Filled 2022-03-01: qty 1

## 2022-03-01 MED ORDER — LIDOCAINE HCL (CARDIAC) PF 100 MG/5ML IV SOSY
PREFILLED_SYRINGE | INTRAVENOUS | Status: DC | PRN
  Administered 2022-03-01: 60 mg via INTRAVENOUS

## 2022-03-01 MED ORDER — MIDAZOLAM HCL 2 MG/2ML IJ SOLN
INTRAMUSCULAR | Status: DC | PRN
  Administered 2022-03-01: 2 mg via INTRAVENOUS

## 2022-03-01 MED ORDER — NEOMYCIN-POLYMYXIN B GU 40-200000 IR SOLN
Status: AC
  Filled 2022-03-01: qty 20

## 2022-03-01 MED ORDER — BUPIVACAINE HCL (PF) 0.5 % IJ SOLN
INTRAMUSCULAR | Status: AC
  Filled 2022-03-01: qty 30

## 2022-03-01 MED ORDER — FAMOTIDINE 20 MG PO TABS
ORAL_TABLET | ORAL | Status: AC
  Administered 2022-03-01: 20 mg via ORAL
  Filled 2022-03-01: qty 1

## 2022-03-01 MED ORDER — KETOROLAC TROMETHAMINE 30 MG/ML IJ SOLN
INTRAMUSCULAR | Status: DC | PRN
  Administered 2022-03-01: 15 mg via INTRAVENOUS

## 2022-03-01 MED ORDER — SODIUM CHLORIDE 0.9 % IR SOLN
Status: DC | PRN
  Administered 2022-03-01: 502 mL

## 2022-03-01 MED ORDER — PROPOFOL 10 MG/ML IV BOLUS
INTRAVENOUS | Status: AC
  Filled 2022-03-01: qty 20

## 2022-03-01 MED ORDER — HYDROCODONE-ACETAMINOPHEN 5-325 MG PO TABS: 1.0000 | ORAL_TABLET | Freq: Four times a day (QID) | ORAL | 0 refills | Status: DC | PRN

## 2022-03-01 MED ORDER — PHENYLEPHRINE 80 MCG/ML (10ML) SYRINGE FOR IV PUSH (FOR BLOOD PRESSURE SUPPORT)
PREFILLED_SYRINGE | INTRAVENOUS | Status: AC
  Filled 2022-03-01: qty 10

## 2022-03-01 MED ORDER — DEXAMETHASONE SODIUM PHOSPHATE 10 MG/ML IJ SOLN
INTRAMUSCULAR | Status: AC
  Filled 2022-03-01: qty 1

## 2022-03-01 MED ORDER — OXYCODONE HCL 5 MG PO TABS: 5.0000 mg | ORAL_TABLET | Freq: Once | ORAL | Status: DC | PRN

## 2022-03-01 MED ORDER — MELOXICAM 15 MG PO TABS: 15.0000 mg | ORAL_TABLET | Freq: Every day | ORAL | 3 refills | Status: DC

## 2022-03-01 MED ORDER — CHLORHEXIDINE GLUCONATE 0.12 % MT SOLN
OROMUCOSAL | Status: AC
  Administered 2022-03-01: 15 mL via OROMUCOSAL
  Filled 2022-03-01: qty 15

## 2022-03-01 MED ORDER — ONDANSETRON HCL 4 MG/2ML IJ SOLN
INTRAMUSCULAR | Status: AC
  Filled 2022-03-01: qty 2

## 2022-03-01 MED ORDER — PROPOFOL 10 MG/ML IV BOLUS
INTRAVENOUS | Status: DC | PRN
  Administered 2022-03-01: 120 mg via INTRAVENOUS

## 2022-03-01 MED ORDER — FENTANYL CITRATE (PF) 100 MCG/2ML IJ SOLN: 25.0000 ug | INTRAMUSCULAR | Status: DC | PRN

## 2022-03-01 MED ORDER — FENTANYL CITRATE (PF) 100 MCG/2ML IJ SOLN
INTRAMUSCULAR | Status: AC
  Filled 2022-03-01: qty 2

## 2022-03-01 MED ORDER — CEFAZOLIN SODIUM-DEXTROSE 2-4 GM/100ML-% IV SOLN
INTRAVENOUS | Status: AC
  Filled 2022-03-01: qty 100

## 2022-03-01 MED ORDER — GABAPENTIN 300 MG PO CAPS
ORAL_CAPSULE | ORAL | Status: AC
  Administered 2022-03-01: 300 mg via ORAL
  Filled 2022-03-01: qty 1

## 2022-03-01 MED ORDER — OXYCODONE HCL 5 MG/5ML PO SOLN: 5.0000 mg | Freq: Once | ORAL | Status: DC | PRN

## 2022-03-01 MED ORDER — ACETAMINOPHEN 10 MG/ML IV SOLN
INTRAVENOUS | Status: DC | PRN
  Administered 2022-03-01: 1000 mg via INTRAVENOUS

## 2022-03-01 MED ORDER — PROPOFOL 1000 MG/100ML IV EMUL
INTRAVENOUS | Status: AC
  Filled 2022-03-01: qty 100

## 2022-03-01 MED ORDER — ONDANSETRON HCL 4 MG/2ML IJ SOLN
INTRAMUSCULAR | Status: DC | PRN
  Administered 2022-03-01: 4 mg via INTRAVENOUS

## 2022-03-01 MED ORDER — LACTATED RINGERS IV SOLN: INTRAVENOUS | Status: DC | PRN

## 2022-03-01 MED ORDER — FENTANYL CITRATE (PF) 100 MCG/2ML IJ SOLN
INTRAMUSCULAR | Status: DC | PRN
  Administered 2022-03-01 (×4): 25 ug via INTRAVENOUS

## 2022-03-01 MED ORDER — LIDOCAINE HCL (PF) 2 % IJ SOLN
INTRAMUSCULAR | Status: AC
  Filled 2022-03-01: qty 5

## 2022-03-01 MED ORDER — GLYCOPYRROLATE 0.2 MG/ML IJ SOLN
INTRAMUSCULAR | Status: DC | PRN
  Administered 2022-03-01: .2 mg via INTRAVENOUS

## 2022-03-01 SURGICAL SUPPLY — 29 items
BLADE SURG MINI STRL (BLADE) ×1 IMPLANT
BNDG ESMARK 4X12 TAN STRL LF (GAUZE/BANDAGES/DRESSINGS) ×1 IMPLANT
CHLORAPREP W/TINT 26 (MISCELLANEOUS) ×1 IMPLANT
DRSG GAUZE FLUFF 36X18 (GAUZE/BANDAGES/DRESSINGS) IMPLANT
DRSG XEROFORM 1X8 (GAUZE/BANDAGES/DRESSINGS) IMPLANT
ELECT REM PT RETURN 9FT ADLT (ELECTROSURGICAL) ×1
ELECTRODE REM PT RTRN 9FT ADLT (ELECTROSURGICAL) ×1 IMPLANT
GAUZE SPONGE 4X4 12PLY STRL (GAUZE/BANDAGES/DRESSINGS) IMPLANT
GLOVE BIO SURGEON STRL SZ8 (GLOVE) ×1 IMPLANT
GOWN STRL REUS W/ TWL LRG LVL3 (GOWN DISPOSABLE) ×1 IMPLANT
GOWN STRL REUS W/TWL LRG LVL3 (GOWN DISPOSABLE) ×1
GOWN STRL REUS W/TWL LRG LVL4 (GOWN DISPOSABLE) ×1 IMPLANT
KIT TURNOVER KIT A (KITS) ×1 IMPLANT
MANIFOLD NEPTUNE II (INSTRUMENTS) ×1 IMPLANT
NS IRRIG 500ML POUR BTL (IV SOLUTION) ×1 IMPLANT
PACK EXTREMITY ARMC (MISCELLANEOUS) ×1 IMPLANT
PAD CAST 4YDX4 CTTN HI CHSV (CAST SUPPLIES) IMPLANT
PAD PREP 24X41 OB/GYN DISP (PERSONAL CARE ITEMS) ×1 IMPLANT
PADDING CAST COTTON 4X4 STRL (CAST SUPPLIES) ×1
SPLINT CAST 1 STEP 3X12 (MISCELLANEOUS) IMPLANT
STOCKINETTE 48X4 2 PLY STRL (GAUZE/BANDAGES/DRESSINGS) ×1 IMPLANT
STOCKINETTE BIAS CUT 4 980044 (GAUZE/BANDAGES/DRESSINGS) ×1 IMPLANT
STOCKINETTE STRL 4IN 9604848 (GAUZE/BANDAGES/DRESSINGS) ×1 IMPLANT
SUT ETHILON 4-0 (SUTURE) ×1
SUT ETHILON 4-0 FS2 18XMFL BLK (SUTURE) ×1
SUT ETHILON 5-0 FS-2 18 BLK (SUTURE) ×1 IMPLANT
SUTURE ETHLN 4-0 FS2 18XMF BLK (SUTURE) ×1 IMPLANT
TRAP FLUID SMOKE EVACUATOR (MISCELLANEOUS) ×1 IMPLANT
WATER STERILE IRR 500ML POUR (IV SOLUTION) ×1 IMPLANT

## 2022-03-01 NOTE — Transfer of Care (Signed)
Immediate Anesthesia Transfer of Care Note  Patient: Cheyenne Lopez  Procedure(s) Performed: CARPAL TUNNEL RELEASE (Right)  Patient Location: PACU  Anesthesia Type:General  Level of Consciousness: awake  Airway & Oxygen Therapy: Patient Spontanous Breathing and Patient connected to face mask oxygen  Post-op Assessment: Report given to RN and Post -op Vital signs reviewed and stable  Post vital signs: Reviewed and stable  Last Vitals:  Vitals Value Taken Time  BP 110/51 03/01/22 1025  Temp 97.0   Pulse 80 03/01/22 1030  Resp 17 03/01/22 1030  SpO2 94 % 03/01/22 1030  Vitals shown include unvalidated device data.  Last Pain:  Vitals:   03/01/22 0845  TempSrc: Temporal  PainSc: 0-No pain         Complications: No notable events documented.

## 2022-03-01 NOTE — Anesthesia Preprocedure Evaluation (Addendum)
Anesthesia Evaluation  Patient identified by MRN, date of birth, ID band Patient awake    Reviewed: Allergy & Precautions, NPO status , Patient's Chart, lab work & pertinent test results  History of Anesthesia Complications (+) PONV and history of anesthetic complications  Airway Mallampati: III  TM Distance: >3 FB Neck ROM: full    Dental  (+) Teeth Intact   Pulmonary sleep apnea and Continuous Positive Airway Pressure Ventilation    Pulmonary exam normal        Cardiovascular Exercise Tolerance: Good hypertension, On Medications + CAD, + Past MI, + Cardiac Stents and + DOE  Normal cardiovascular exam  Cleared by cardiology   Neuro/Psych  Neuromuscular disease CVA, No Residual Symptoms  negative psych ROS   GI/Hepatic Neg liver ROS,GERD  ,,  Endo/Other  diabetesHypothyroidism    Renal/GU      Musculoskeletal   Abdominal   Peds  Hematology negative hematology ROS (+)   Anesthesia Other Findings Past Medical History: No date: Adenomatous colon polyp No date: Aortic atherosclerosis (HCC) No date: Bilateral ocular hypertension No date: Carpal tunnel syndrome No date: Complication of anesthesia     Comment:  a.) PONV 07/15/2019: Coronary artery disease     Comment:  a.) LHC/PCI 07/15/2019: 100% pRCA (chronic; unable to               cross wire), 40% mLAD, 85% o-pLCx (2.5 x 22 mm Resolute               Onyx DES) No date: CVA (cerebral vascular accident) ([place])     Comment:  a.) MRI brain 10/03/2020: small chronic               cortical/subcortical infarct within LEFT parietal lobe               (postcentral gyrus involved); small chronic infarcts               within BILATERAL cerebellar hemispheres 82/50/5397: Diastolic dysfunction     Comment:  a.) TTE 07/15/2019 (setting of NSTEMI): EF 60-65%, mid               and distal lateral wall HK, G1DD; b.) TTE 02/13/2021: EF               >55%, triv MR/TR No date:  DM (diabetes mellitus), type 1 ([place]) No date: DOE (dyspnea on exertion) No date: GERD (gastroesophageal reflux disease) No date: Glaucoma No date: Hypertension No date: Hypothyroidism No date: Insulin pump in place     Comment:  a.) Omnipod 5 07/14/2019: NSTEMI (non-ST elevated myocardial infarction) [facility])     Comment:  a.) troponins were trended: 23 --> 145 --> 11,794 -->               12,794 ng/L; b.) LHC/PCI 07/15/2019: 100% pRCA (chronic;               unable to cross wire), 40% mLAD, 85% o-pLCx (2.5 x 22 mm               Resolute Onyx DES) No date: Obstructive sleep apnea on CPAP No date: PONV (postoperative nausea and vomiting) No date: Refusal of blood transfusions as patient is Jehovah's Witness  Past Surgical History: No date: ANAL FISSURE REPAIR No date: BLADDER SURGERY No date: COLONOSCOPY 07/15/2019: CORONARY STENT INTERVENTION; N/A     Comment:  Procedure: CORONARY STENT INTERVENTION;  Surgeon:  Callwood, Dwayne D, MD;  Location: [place] [facility];               Service: Cardiovascular;  Laterality: N/A;  CFX  07/15/2019: LEFT HEART CATH AND CORONARY ANGIOGRAPHY; N/A     Comment:  Procedure: LEFT HEART CATH AND CORONARY ANGIOGRAPHY;                Surgeon: Dionisio David, MD;  Location: [place] [facility]              [facility];  Service: Cardiovascular;  Laterality: N/A;     Reproductive/Obstetrics negative OB ROS                             Anesthesia Physical Anesthesia Plan  ASA: 3  Anesthesia Plan: General LMA   Post-op Pain Management: Minimal or no pain anticipated, Toradol IV (intra-op)* and Ofirmev IV (intra-op)*   Induction: Intravenous  PONV Risk Score and Plan: Dexamethasone, Ondansetron, Midazolam and Treatment may vary due to age or medical condition  Airway Management Planned: LMA  Additional Equipment:   Intra-op Plan:   Post-operative Plan: Extubation in OR  Informed Consent: I have reviewed the  patients History and Physical, chart, labs and discussed the procedure including the risks, benefits and alternatives for the proposed anesthesia with the patient or authorized representative who has indicated his/her understanding and acceptance.     Dental Advisory Given  Plan Discussed with: Anesthesiologist, CRNA and Surgeon  Anesthesia Plan Comments: (Patient consented for risks of anesthesia including but not limited to:  - adverse reactions to medications - damage to eyes, teeth, lips or other oral mucosa - nerve damage due to positioning  - sore throat or hoarseness - Damage to heart, brain, nerves, lungs, other parts of body or loss of life  Patient voiced understanding.)        Anesthesia Quick Evaluation

## 2022-03-01 NOTE — Anesthesia Postprocedure Evaluation (Signed)
Anesthesia Post Note  Patient: Cheyenne Lopez  Procedure(s) Performed: CARPAL TUNNEL RELEASE (Right)  Patient location during evaluation: PACU Anesthesia Type: General Level of consciousness: awake and alert Pain management: pain level controlled Vital Signs Assessment: post-procedure vital signs reviewed and stable Respiratory status: spontaneous breathing, nonlabored ventilation, respiratory function stable and patient connected to nasal cannula oxygen Cardiovascular status: blood pressure returned to baseline and stable Postop Assessment: no apparent nausea or vomiting Anesthetic complications: no   No notable events documented.   Last Vitals:  Vitals:   03/01/22 1030 03/01/22 1045  BP: (!) 117/52 (!) 118/51  Pulse: 80 71  Resp: 17 16  Temp:  (!) 36.2 C  SpO2: 98% 97%    Last Pain:  Vitals:   03/01/22 1045  TempSrc:   PainSc: 0-No pain                 Ilene Qua

## 2022-03-01 NOTE — Discharge Instructions (Signed)
AMBULATORY SURGERY  ?DISCHARGE INSTRUCTIONS ? ? ?The drugs that you were given will stay in your system until tomorrow so for the next 24 hours you should not: ? ?Drive an automobile ?Make any legal decisions ?Drink any alcoholic beverage ? ? ?You may resume regular meals tomorrow.  Today it is better to start with liquids and gradually work up to solid foods. ? ?You may eat anything you prefer, but it is better to start with liquids, then soup and crackers, and gradually work up to solid foods. ? ? ?Please notify your doctor immediately if you have any unusual bleeding, trouble breathing, redness and pain at the surgery site, drainage, fever, or pain not relieved by medication. ? ? ? ?Additional Instructions: ? ? ? ?Please contact your physician with any problems or Same Day Surgery at [phone], Monday through Friday 6 am to 4 pm, or [place] [facility] number at [phone].  ?

## 2022-03-01 NOTE — Op Note (Signed)
03/01/2022  10:21 AM  PATIENT:  Cheyenne Lopez    PRE-OPERATIVE DIAGNOSIS: RIGHT CARPAL TUNNEL SYNDROME POST-OPERATIVE DIAGNOSIS: RIGHT CARPAL TUNNEL SYNDROME  PROCEDURE:  RIGHT CARPAL TUNNEL RELEASE  SURGEON: Park Breed, MD    ANESTHESIA:   General  TOURNIQUET TIME: 44   MIN  PREOPERATIVE INDICATIONS:  Cheyenne Lopez is a  72 y.o. female with a diagnosis of right carpal tunnel syndrome who failed conservative measures and elected for surgical management.    The risks benefits and alternatives were discussed with the patient preoperatively including but not limited to the risks of infection, bleeding, nerve injury, incomplete relief of symptoms, pillar pain, cardiopulmonary complications, the need for revision surgery, among others, and the patient was willing to proceed.  OPERATIVE FINDINGS: Thickened volar ligament and nerve compression.  OPERATIVE PROCEDURE: The patient is brought to the operating room placed in the supine position. General anesthesia was administered. The right upper extremity was prepped and draped in usual sterile fashion. Time out was performed. The arm was elevated and exsanguinated and the tourniquet was inflated. Incision was made in line with the radial border of the ring finger. The carpal tunnel transverse fascia was identified, cleaned, and incised sharply. The common sensory branches were visualized along with the superficial palmar arch and protected.  The median nerve was protected below  A Kelly clamp was  placed underneath the transverse carpal ligament, protecting the nerve. I released the ligament completely, and then released the proximal distal volar forearm fascia. The nerve was identified, and visualized, and protected throughout the case. The motor branch was intact upon inspection.  No masses or abnormalities were identified in ulnar bursa.  The wounds were irrigated copiously, and the skin closed with nylon. The wound was injected with  1/2% marcaine followed by a sterile dressing and  volar splint .  Tourniquet was deflated with good return of blood flow to all fingers. Sponge and needle counts were correct.  The patient tolerated this well, with no complications. The patient was awakened and taken to recovery in good condition.

## 2022-03-01 NOTE — H&P (Signed)
THE PATIENT WAS SEEN PRIOR TO SURGERY TODAY.  HISTORY, ALLERGIES, HOME MEDICATIONS AND OPERATIVE PROCEDURE WERE REVIEWED. RISKS AND BENEFITS OF SURGERY DISCUSSED WITH PATIENT AGAIN.  NO CHANGES FROM INITIAL HISTORY AND PHYSICAL NOTED.    

## 2022-03-01 NOTE — Anesthesia Procedure Notes (Addendum)
Procedure Name: LMA Insertion Date/Time: 03/01/2022 9:39 AM  Performed by: Loni Dolly, CRNAPre-anesthesia Checklist: Patient identified, Emergency Drugs available, Suction available and Patient being monitored Patient Re-evaluated:Patient Re-evaluated prior to induction Oxygen Delivery Method: Circle system utilized Preoxygenation: Pre-oxygenation with 100% oxygen Induction Type: IV induction Ventilation: Mask ventilation without difficulty LMA: LMA inserted LMA Size: 4.0 Tube type: Oral Number of attempts: 1 Airway Equipment and Method: Stylet and Oral airway Placement Confirmation: ETT inserted through vocal cords under direct vision, positive ETCO2 and breath sounds checked- equal and bilateral Tube secured with: Tape Dental Injury: Teeth and Oropharynx as per pre-operative assessment

## 2022-03-01 NOTE — OR Nursing (Signed)
Dr. Danne Baxter from anesthesia notified of bloos sugar of 263. Patient reports her insulin pump is on and infusing at 24 hour active mode. Dr. Danne Baxter reports she will have the patient put her settings back to normal after surgery.

## 2022-03-02 ENCOUNTER — Encounter: Payer: Self-pay | Admitting: Specialist

## 2022-03-16 DIAGNOSIS — G5601 Carpal tunnel syndrome, right upper limb: Secondary | ICD-10-CM | POA: Insufficient documentation

## 2022-03-28 ENCOUNTER — Other Ambulatory Visit: Payer: 59

## 2022-04-26 ENCOUNTER — Ambulatory Visit
Admission: RE | Admit: 2022-04-26 | Discharge: 2022-04-26 | Disposition: A | Discharge status: Home / Self Care | Payer: Medicare Other | Source: Ambulatory Visit | Attending: Family Medicine | Admitting: Family Medicine

## 2022-04-26 DIAGNOSIS — Z780 Asymptomatic menopausal state: Secondary | ICD-10-CM | POA: Insufficient documentation

## 2022-04-26 DIAGNOSIS — Z1231 Encounter for screening mammogram for malignant neoplasm of breast: Secondary | ICD-10-CM | POA: Insufficient documentation

## 2022-04-27 ENCOUNTER — Encounter: Payer: Self-pay | Admitting: Family Medicine

## 2022-05-07 ENCOUNTER — Other Ambulatory Visit: Payer: Self-pay | Admitting: Family Medicine

## 2022-05-07 DIAGNOSIS — R928 Other abnormal and inconclusive findings on diagnostic imaging of breast: Secondary | ICD-10-CM

## 2022-05-09 ENCOUNTER — Ambulatory Visit
Admission: RE | Admit: 2022-05-09 | Discharge: 2022-05-09 | Disposition: A | Discharge status: Home / Self Care | Payer: Medicare Other | Source: Ambulatory Visit | Attending: Family Medicine | Admitting: Family Medicine

## 2022-05-09 DIAGNOSIS — R928 Other abnormal and inconclusive findings on diagnostic imaging of breast: Secondary | ICD-10-CM | POA: Insufficient documentation

## 2022-11-12 DIAGNOSIS — Z789 Other specified health status: Secondary | ICD-10-CM | POA: Insufficient documentation

## 2023-04-16 ENCOUNTER — Other Ambulatory Visit: Payer: Self-pay | Admitting: Family Medicine

## 2023-04-16 DIAGNOSIS — Z1231 Encounter for screening mammogram for malignant neoplasm of breast: Secondary | ICD-10-CM

## 2023-05-01 ENCOUNTER — Ambulatory Visit
Admission: RE | Admit: 2023-05-01 | Discharge: 2023-05-01 | Disposition: A | Discharge status: Home / Self Care | Payer: Medicare Other | Source: Ambulatory Visit | Attending: Family Medicine | Admitting: Family Medicine

## 2023-05-01 DIAGNOSIS — Z1231 Encounter for screening mammogram for malignant neoplasm of breast: Secondary | ICD-10-CM | POA: Insufficient documentation

## 2023-07-23 ENCOUNTER — Ambulatory Visit: Admission: EM | Admit: 2023-07-23 | Discharge: 2023-07-23 | Disposition: A | Discharge status: Home / Self Care

## 2023-07-23 ENCOUNTER — Encounter: Payer: Self-pay | Admitting: Emergency Medicine

## 2023-07-23 DIAGNOSIS — M79601 Pain in right arm: Secondary | ICD-10-CM | POA: Diagnosis not present

## 2023-07-23 MED ORDER — DEXAMETHASONE SODIUM PHOSPHATE 10 MG/ML IJ SOLN
10.0000 mg | Freq: Once | INTRAMUSCULAR | Status: AC
  Administered 2023-07-23: 10 mg via INTRAMUSCULAR

## 2023-07-23 NOTE — ED Provider Notes (Signed)
 [facility]    CSN: [id number] Arrival date & time: 07/23/23  0931      History   Chief Complaint Chief Complaint  Patient presents with   Arm Pain    HPI Cheyenne Lopez is a 73 y.o. female.   HPI  73 year old female with past medical history significant for carpal tunnel syndrome, diabetes, dyspnea exertion, CAD status post NSTEMI, hypothyroidism, GERD, OSA, diastolic dysfunction, CVA, and aortic atherosclerosis presents for evaluation of pain in her right arm and hand that goes from her elbow to the tips of her fingers.  She is keeping her hand at 90 degrees across her abdomen as she reports that any attempt to move it causes sharp increase in pain.  She has tried ibuprofen, meloxicam , and hydrocodone  without relief of symptoms.  She denies any injury or precipitating events.  She did have something similar happen on the left side but it only involves her wrist and hand.  She was seen at orthopedics and was offered steroids.  Past Medical History:  Diagnosis Date   Adenomatous colon polyp    Aortic atherosclerosis (HCC)    Bilateral ocular hypertension    Carpal tunnel syndrome    Complication of anesthesia    a.) PONV   Coronary artery disease 07/15/2019   a.) LHC/PCI 07/15/2019: 100% pRCA (chronic; unable to cross wire), 40% mLAD, 85% o-pLCx (2.5 x 22 mm Resolute Onyx DES)   CVA (cerebral vascular accident) (HCC)    a.) MRI brain 10/03/2020: small chronic cortical/subcortical infarct within LEFT parietal lobe (postcentral gyrus involved); small chronic infarcts within BILATERAL cerebellar hemispheres   Diastolic dysfunction 07/15/2019   a.) TTE 07/15/2019 (setting of NSTEMI): EF 60-65%, mid and distal lateral wall HK, G1DD; b.) TTE 02/13/2021: EF >55%, triv MR/TR   DM (diabetes mellitus), type 1 (HCC)    DOE (dyspnea on exertion)    GERD (gastroesophageal reflux disease)    Glaucoma    Hypertension    Hypothyroidism    Insulin  pump in place    a.)  Omnipod 5   NSTEMI (non-ST elevated myocardial infarction) (HCC) 07/14/2019   a.) troponins were trended: 23 --> 145 --> 11,794 --> 12,794 ng/L; b.) LHC/PCI 07/15/2019: 100% pRCA (chronic; unable to cross wire), 40% mLAD, 85% o-pLCx (2.5 x 22 mm Resolute Onyx DES)   Obstructive sleep apnea on CPAP    PONV (postoperative nausea and vomiting)    Refusal of blood transfusions as patient is Jehovah's Witness     Patient Active Problem List   Diagnosis Date Noted   Acute lower UTI 07/16/2019   Hypertension complicating diabetes (HCC) 07/16/2019   Type 1 diabetes mellitus without complication (HCC) 07/16/2019   NSTEMI (non-ST elevated myocardial infarction) (HCC) 07/15/2019   Chest pain 07/14/2019    Past Surgical History:  Procedure Laterality Date   ANAL FISSURE REPAIR     BLADDER SURGERY     CARPAL TUNNEL RELEASE Right 03/01/2022   Procedure: CARPAL TUNNEL RELEASE;  Surgeon: Marlynn Singer, MD;  Location: [facility];  Service: Orthopedics;  Laterality: Right;   COLONOSCOPY     CORONARY STENT INTERVENTION N/A 07/15/2019   Procedure: CORONARY STENT INTERVENTION;  Surgeon: Antonette Batters, MD;  Location: [facility];  Service: Cardiovascular;  Laterality: N/A;  CFX    LEFT HEART CATH AND CORONARY ANGIOGRAPHY N/A 07/15/2019   Procedure: LEFT HEART CATH AND CORONARY ANGIOGRAPHY;  Surgeon: Cherrie Cornwall, MD;  Location: [facility];  Service: Cardiovascular;  Laterality: N/A;    OB History   No obstetric history on file.      Home Medications    Prior to Admission medications   Medication Sig Start Date End Date Taking? Authorizing Provider  GVOKE HYPOPEN 2-PACK 1 MG/0.2ML SOAJ Inject 1 mg into the skin. 01/25/23  Yes [provider]  levothyroxine (SYNTHROID) 75 MCG tablet Take 75 mcg by mouth. 06/12/23 09/04/24 Yes [provider]  liothyronine (CYTOMEL) 5 MCG tablet Take 5 mcg by mouth. 06/12/23 09/04/24 Yes [provider]  Travoprost,  BAK Free, (TRAVATAN) 0.004 % SOLN ophthalmic solution Apply 1 drop to eye. 12/04/21  Yes [provider]  aspirin  81 MG chewable tablet Chew 1 tablet (81 mg total) by mouth daily. 07/17/19   ShahmehdiConstantino Demark, MD  Calcium -Magnesium-Vitamin D (CALCIUM  MAGNESIUM PO) Take 3 tablets by mouth daily at 6 (six) AM.    [provider]  chlorhexidine  (PERIDEX ) 0.12 % solution 30 mLs by Mouth Rinse route See admin instructions. Rinse as needed for mouth sores. 02/16/19   [provider]  Cobalamin Combinations (VITAMIN B12-FOLIC ACID PO) Take 1 Dose by mouth daily at 6 (six) AM.    [provider]  COLLAGEN PO Take 1 Scoop by mouth daily at 6 (six) AM.    [provider]  gabapentin  (NEURONTIN ) 400 MG capsule Take 1 capsule (400 mg total) by mouth 3 (three) times daily. 03/01/22   Marlynn Singer, MD  Glucagon (BAQSIMI ONE PACK) 3 MG/DOSE POWD     [provider]  HYDROcodone -acetaminophen  (NORCO) 5-325 MG tablet Take 1-2 tablets by mouth every 6 (six) hours as needed. 03/01/22   Marlynn Singer, MD  Insulin  Human (INSULIN  PUMP) SOLN Inject into the skin. Omnipod    [provider]  insulin  lispro (HUMALOG) 100 UNIT/ML injection Inject 50 Units into the skin See admin instructions. Inject up to 50 units daily via insulin  pump    [provider]  latanoprost  (XALATAN ) 0.005 % ophthalmic solution Place 1 drop into both eyes at bedtime.    [provider]  meloxicam  (MOBIC ) 15 MG tablet Take 1 tablet (15 mg total) by mouth daily. 03/01/22   Marlynn Singer, MD  Multiple Vitamin (MULTIVITAMIN) capsule Take 1 capsule by mouth daily.    [provider]  nitroGLYCERIN  (NITROSTAT ) 0.4 MG SL tablet Place 1 tablet (0.4 mg total) under the tongue every 5 (five) minutes as needed for chest pain. 07/16/19   Shahmehdi, Seyed A, MD  traZODone (DESYREL) 50 MG tablet Take 25 mg by mouth at bedtime as needed. 02/26/19   [provider]   triamcinolone (NASACORT) 55 MCG/ACT AERO nasal inhaler Place 1 spray into the nose daily as needed. 08/18/09   [provider]  valsartan (DIOVAN) 40 MG tablet Take 20 mg by mouth every morning. 10/27/17   [provider]  losartan (COZAAR) 25 MG tablet Take 25 mg by mouth daily.  02/07/19  [provider]  ranitidine (ZANTAC) 300 MG tablet  08/22/17 02/07/19  [provider]    Family History Family History  Problem Relation Age of Onset   Alzheimer's disease Mother    Heart failure Father    Breast cancer Neg Hx     Social History Social History   Tobacco Use   Smoking status: Never   Smokeless tobacco: Never  Vaping Use   Vaping status: Never Used  Substance Use Topics   Alcohol use: Yes    Comment: wine with  dinner   Drug use: No     Allergies   Ezetimibe and Rosuvastatin   Review of Systems Review of Systems  Musculoskeletal:  Positive for arthralgias and myalgias. Negative for joint swelling.  Skin:  Negative for color change.  Neurological:  Positive for weakness. Negative for numbness.     Physical Exam Triage Vital Signs ED Triage Vitals  Encounter Vitals Group     BP      Systolic BP Percentile      Diastolic BP Percentile      Pulse      Resp      Temp      Temp src      SpO2      Weight      Height      Head Circumference      Peak Flow      Pain Score      Pain Loc      Pain Education      Exclude from Growth Chart    No data found.  Updated Vital Signs BP (!) 164/84 (BP Location: Left Arm)   Pulse 75   Temp 99.2 F (37.3 C) (Oral)   Resp 16   SpO2 98%   Visual Acuity Right Eye Distance:   Left Eye Distance:   Bilateral Distance:    Right Eye Near:   Left Eye Near:    Bilateral Near:     Physical Exam Vitals and nursing note reviewed.  Constitutional:      Appearance: Normal appearance. She is not ill-appearing.  Musculoskeletal:        General: Tenderness present. No swelling,  deformity or signs of injury.  Skin:    General: Skin is warm and dry.     Capillary Refill: Capillary refill takes less than 2 seconds.     Findings: No bruising or erythema.  Neurological:     General: No focal deficit present.     Mental Status: She is alert and oriented to person, place, and time.      UC Treatments / Results  Labs (all labs ordered are listed, but only abnormal results are displayed) Labs Reviewed - No data to display  EKG   Radiology No results found.  Procedures Procedures (including critical care time)  Medications Ordered in UC Medications  dexamethasone  (DECADRON ) injection 10 mg (has no administration in time range)    Initial Impression / Assessment and Plan / UC Course  I have reviewed the triage vital signs and the nursing notes.  Pertinent labs & imaging results that were available during my care of the patient were reviewed by me and considered in my medical decision making (see chart for details).   Patient is a nontoxic-appearing 73 year old female presenting for evaluation of right arm and hand pain as outlined in the HPI above.  She does have decreased grip strength in her right hand secondary to pain.  She is reluctant to perform any range of motion of her wrist but she will pronate and supinate her forearm without difficulty.  She has pain with palpation of the PIP joints proximal on all fingers and hand into the forearm.  She is more tender on her medial aspect of her forearm than the lateral.  No pain with palpation of the medial or lateral epicondyle of the humerus.  I suspect the patient has some form of tendinopathy and she is reluctant to take steroids because she is diabetic.  I offered her an  injection of Decadron  and advised her that this would raise her sugar but not nearly to the degree that a prednisone taper would.  The shot should last for 3 days and if she still having pain she should follow-up with orthopedics as she may need  dedicated physical therapy or she may need pain interventions that we are unable to provide here clinic such as TENS units or biofeedback.   Final Clinical Impressions(s) / UC Diagnoses   Final diagnoses:  Right arm pain     Discharge Instructions      You may continue to take over-the-counter Tylenol  to help supplement the pain relief from the steroid injection.  I would recommend applying moist heat to your forearm to help improve blood flow to the muscle groups and help facilitate removal of lactic acid and other metabolic byproducts of inflammation.  Increase your oral fluid intake so that you help improve your hydration to make it easier for your body to remove these metabolic waste products.  I have given you some home physical therapy exercises to perform to see if this helps with your pain.  If your symptoms do not improve, or your pain has not improved in the next 3 days, I recommend you follow-up with orthopedics as you may need dedicated physical therapy or you may need other pain relief measures such as biofeedback or TENS unit.     ED Prescriptions   None    PDMP not reviewed this encounter.   Kent Pear, NP 07/23/23 1049

## 2023-07-23 NOTE — ED Triage Notes (Signed)
 Pt presents with right arm pain from her elbow to her wrist x 1 week. Pt has tried heat/ice, meloxicam , ibuprofen and hydrocodone  with no relief.

## 2023-07-23 NOTE — Discharge Instructions (Addendum)
 You may continue to take over-the-counter Tylenol  to help supplement the pain relief from the steroid injection.  I would recommend applying moist heat to your forearm to help improve blood flow to the muscle groups and help facilitate removal of lactic acid and other metabolic byproducts of inflammation.  Increase your oral fluid intake so that you help improve your hydration to make it easier for your body to remove these metabolic waste products.  I have given you some home physical therapy exercises to perform to see if this helps with your pain.  If your symptoms do not improve, or your pain has not improved in the next 3 days, I recommend you follow-up with orthopedics as you may need dedicated physical therapy or you may need other pain relief measures such as biofeedback or TENS unit.

## 2023-11-14 NOTE — Progress Notes (Signed)
 Established Patient Visit   Chief Complaint: Chief Complaint  Patient presents with  . Follow-up    1 month- sob with exertion   Date of Service: 11/27/2023 Date of Birth: 07-Oct-1950 PCP: Eliverto Bette Hover, MD  History of Present Illness: Ms. Cheyenne Lopez is a 73 y.o.female patient who presents for a 1 month follow up. PMH significant for bradycardia, type 1 diabetes, hyperlipidemia, SOB, CAD, NSTEMI, hx of CVA, OSA.  Today, pt presents with a continued chest burning sensation under exertion. This sensation started in July but has not gotten worse. She has SOB before the sensation begins to start. Patient states that she is doing well. Recommend using nitroglycerin  as needed for chest pains. Recomend LHC for further assessment.    Visit Summaries: 10/02/2023 Patient was seen by me for a follow up. Notes that she started feeling a burning sensation in her chest 2 months ago. Will provide a re-fill of nitroglycerin . If the burn persists, may have to consider treatment for possible heart block. Past Medical and Surgical History  Past Medical History Past Medical History:  Diagnosis Date  . Advance directive on file 09/29/2014  . Allergic state seasonal  . Combined forms of age-related cataract of both eyes 06/28/2015  . Coronary artery disease 2021   a stent was put in  . Diabetes mellitus (CMS/HHS-HCC)    on insulin  pump  . Diabetes mellitus type I (CMS/HHS-HCC)   . GERD (gastroesophageal reflux disease)   . Glaucoma   . Hx of adenomatous colonic polyps   . Hypertension complicating diabetes (CMS/HHS-HCC) 07/16/2019   Patient placed on HBP medication for kidney's due to diabetes  . Hypothyroidism   . Lateral epicondylitis 05/01/2016  . PONV (postoperative nausea and vomiting)   . Refusal of blood transfusions as patient is Jehovah's Witness 09/29/2014  . Sleep apnea     Past Surgical History She has a past surgical history that includes Anal fissurectomy (2004); Colonoscopy (2004);  Colonoscopy (2011); D&C (2003); Colonoscopy (08/15/2009); egd (08/15/2009); Tonsillectomy; colonoscopy w/biopsy (N/A, 10/13/2014); urethropexy anterior mmk/burch abdominal (2007); Cardiac catheterization; Coronary artery bypass graft; and colonoscopy w/biopsy (N/A, 07/20/2020).   Medications and Allergies  Current Medications  Current Outpatient Medications  Medication Sig Dispense Refill  . acetone, urine, test strip Use as directed to check ketones during elevated BG reading 20 each 6  . aspirin  81 MG chewable tablet Take 81 mg by mouth once daily    . blood-glucose sensor (DEXCOM G7 SENSOR) Devi Use 1 each every 10 (ten) days 9 each 3  . glucagon (GVOKE HYPOPEN 2-PACK) 1 mg/0.2 mL auto-injector Inject 0.2 mLs (1 mg total) subcutaneously as directed for Low blood sugar 0.4 mL 11  . insulin  LISPRO (HUMALOG U-100 INSULIN ) injection (concentration 100 units/mL) INJECT UP TO 50 UNITS DAILY VIA INSULIN  PUMP 50 mL 3  . insulin  syringe-needle U-100 (BD INSULIN  SYRINGE ULTRA-FINE) 0.3 mL 31 gauge x 5/16 syringe Use 4 times daily as directed 100 each 3  . LANTUS SOLOSTAR U-100 INSULIN  pen injector (concentration 100 units/mL) Inject 10 u once daily for off pump plan 15 mL 11  . levothyroxine (SYNTHROID) 75 MCG tablet Take 1 tablet (75 mcg total) by mouth once daily for 90 days 90 tablet 3  . liothyronine (CYTOMEL) 5 MCG tablet Take 1 tablet (5 mcg total) by mouth once daily for 90 days 90 tablet 3  . multivitamin tablet Take 1 tablet by mouth once daily    . nitroGLYcerin  (NITROSTAT ) 0.4 MG SL tablet Place 1  tablet under the tongue every 5 (five) minutes as needed    . nitroGLYcerin  (NITROSTAT ) 0.4 MG SL tablet Place 1 tablet (0.4 mg total) under the tongue every 5 (five) minutes as needed for Chest pain May take up to 3 doses. 25 tablet 3  . OMNIPOD 5 G6-G7 PODS, GEN 5, Crtg Inject 1 Device subcutaneously every other day 45 each 3  . pen needle, diabetic (BD ULTRA-FINE NANO PEN NEEDLE) 32 gauge x 5/32  Ndle Use daily as directed for off pump plan 100 each 11  . red yeast rice 600 mg Cap Take 2 capsules by mouth    . travoprost (TRAVATAN Z) 0.004 % Ophth ophthalmic solution Place 1 drop into both eyes at bedtime 7.5 mL 3  . traZODone (DESYREL) 50 MG tablet Take 0.5 tablets (25 mg total) by mouth at bedtime as needed for Sleep 90 tablet 1  . valsartan (DIOVAN) 40 MG tablet Take 0.5 tablets (20 mg total) by mouth once daily 45 tablet 3   No current facility-administered medications for this visit.    Allergies: Zetia [ezetimibe], Novolog  [insulin  aspart], Crestor [rosuvastatin], and Lipitor  [atorvastatin ]  Social and Family History  Social History  reports that she has never smoked. She has never used smokeless tobacco. She reports that she does not currently use alcohol. She reports that she does not use drugs.  Family History Family History  Problem Relation Name Age of Onset  . Lupus Mother Delia   . Thyroid  disease Mother Delia   . Alzheimer's disease Mother Delia   . Glaucoma Mother Delia   . Stroke Mother Delia   . Hyperthyroidism Mother Delia   . Rheum arthritis Mother Delia   . Neuropathy Father Veronia   . Glaucoma Father Veronia   . Hyperlipidemia (Elevated cholesterol) Father Veronia   . Colon polyps Father Veronia   . Diabetes Father Veronia   . High blood pressure (Hypertension) Father Veronia   . High blood pressure (Hypertension) Brother    . Hyperlipidemia (Elevated cholesterol) Son    . High blood pressure (Hypertension) Son    . Colon cancer Paternal Aunt    . Alzheimer's disease Maternal Grandmother Ana   . Glaucoma Maternal Grandmother Ana   . Thyroid  disease Maternal Grandmother Ana   . Stroke Maternal Grandmother Ana   . Hypothyroidism Maternal Grandmother Ana   . Colon cancer Other Pat cousin   . Esophageal cancer Neg Hx    . Stomach cancer Neg Hx    . Celiac disease Neg Hx    . Anesthesia problems Neg Hx    . Malignant hypertension Neg Hx    .  Pseudochol deficiency Neg Hx    . Macular degeneration Neg Hx    . Malignant hyperthermia Neg Hx    . PONV Neg Hx      Review of Systems   Pertinent positives and negatives are mentioned above in HPI and all other systems are negative.  Physical Examination   Vitals:BP (!) 142/70 (BP Location: Left upper arm, Patient Position: Sitting, BP Cuff Size: Adult)   Pulse 62   Resp 14   Ht 152.4 cm (5')   Wt 57.9 kg (127 lb 9.6 oz)   SpO2 99%   BMI 24.92 kg/m  Ht:152.4 cm (5') Wt:57.9 kg (127 lb 9.6 oz) ADJ:Anib surface area is 1.57 meters squared. Body mass index is 24.92 kg/m.  HEENT: Pupils equally reactive to light and accomodation  Neck: Supple without thyromegaly, carotid pulses 2+ Lungs:  clear to auscultation bilaterally; no wheezes, rales, rhonchi Heart: Regular rate and rhythm.  No gallops, murmurs or rub Abdomen: soft nontender, nondistended, with normal bowel sounds Extremities: no cyanosis, clubbing, or edema Peripheral Pulses: 2+ in all extremities, 2+ femoral pulses bilaterally Neurologic: Alert and oriented X3; speech intact; face symmetrical; moves all extremities well  Cardiovascular Studies:    Echocardiogram 2D complete: 04/04/2023 CONCLUSION  FT VENTRICULAR SYSTOLIC FUNCTION WITH NO LVH ESTIMATED EF: >55% ELEVATED LA PRESSURES WITH DIASTOLIC DYSFUNCTION (GRADE 2) NORMAL RIGHT VENTRICULAR SYSTOLIC FUNCTION VALVULAR REGURGITATION: No AR, TRIVIAL MR, No PR, TRIVIAL TR NO VALVULAR STENOSIS PHYSICIAN IMPRESSIONS  LA AREA 16cm^2  NM Myocardial Perfusion SPECT multiple (stress and rest): 04/04/2023 FINDINGS: Regional wall motion:  reveals normal myocardial thickening and wall motion. The overall quality of the study is fair.   Artifacts noted: yes lung uptake Left ventricular cavity: normal.   Perfusion Analysis:  SPECT images demonstrate homogeneous tracer distribution throughout the myocardium. Defect type:  Normal   IMPRESSION: Normal myocardial  perfusion scan no evidence of stress-induced myocardial ischemia ejection fraction of 60% with normal wall motion there is evidence of lung uptake and poor overall myocardial uptake of tracer but there is no clear evidence of ischemia   02/13/2021 FINDINGS: Regional wall motion:  reveals normal myocardial thickening and wall motion. The overall quality of the study is good.   Artifacts noted: no Left ventricular cavity: normal.   Perfusion Analysis:  SPECT images demonstrate homogeneous tracer distribution throughout the myocardium. Defect type:  Normal   IMPRESSION: Normal myocardial perfusion scan no evidence of stress-induced myocardial ischemia ejection fraction 59% conclusion negative scan   Cardiac Catheterization:  07/15/2019 Conclusion  Successful PCI and stent to mid circumflex  Reducing the lesion from 99 down to 0%  2.5 x 22 mm resolute Onyx DES deployed at 13 atm  Unable to cross chronic total proximal right  Minx deployed  Angiomax  ran at reduced rate for an additional 2 hours  Cardiac care transferred back to Dr. Deretha    Holter: Holter: 09/20/2020 72-hour Holter Indication syncope Hookup date June 9 through September 04, 2020 Patient wore the Holter for about 3 days   Total beats 207,829 Minimum rate 50 maximum 131 average 71 Mostly sinus rhythm Rare PACs Occasional PVCs No runs No pauses No high-grade block No ST segment changes No diary submitted   Conclusion Relatively benign Holter Occasional PVCs consider low-dose beta-blockade therapy to help with symptom management No obvious indication for syncope on his Holter  Cardiac CT Scan:  Cardiac MRI:   Assessment   73 y.o. female with  1. Chest pain, unspecified type   2. Coronary artery disease involving native coronary artery of native heart without angina pectoris   3. Non-ST elevation myocardial infarction (NSTEMI) (CMS/HHS-HCC)   4. Presence of drug coated stent in left circumflex coronary  artery   5. History of CVA (cerebrovascular accident)   6. Hyperlipidemia, unspecified hyperlipidemia type   7. Statin intolerance   8. Bradycardia   9. OSA (obstructive sleep apnea)   10. Equivalent angina ()   11. SOB (shortness of breath)   12. Type 1 diabetes mellitus with hyperglycemia (CMS/HHS-HCC)   13. Pre-op testing    Plan   Chest pains, take nitroglycerin  as needed for chest pains, recommend LHC for further assessment  CAD, hx of NSTEMI, s/p PCI and stent, continue repatha or praluent, aspirin , valsartan, nitroglycerin  as needed for chest pains Hyperlipidemia, continue repatha or praluent therapy for lipid  management, follow up with Dr. Buren as needed Bradycardia, today's HY was 62 bpm, stable, no indication for PPM  OSA, continue CPAP use, recommend weight loss Angina, continue nitroglycerin  as needed for chest pains SOB, continue valsartan, consider inhalers, diuretics, reasonably stable Diabetes, continue lantus, humalog, follow up with PCP as needed    Return in about 1 month (around 12/27/2023).  This note is partially written by Leita Ellen, in the presence of and acting as the scribe of Dr. Cara Lovelace.      Leita Ellen  I have reviewed, edited and added to the note to reflect my best personal medical judgment.  Attestation Statement:   I personally performed the service. (TP)  DWAYNE JONETTA LOVELACE, MD  [facility] [place], [place] Ph:  [phone] Fax:  [phone] This note was generated in part with voice recognition software, Dragon.  I apologize for any typographical errors that were not detected and corrected from this process.  They are unintentional.

## 2023-12-04 ENCOUNTER — Encounter
Admission: RE | Disposition: A | Discharge status: Home / Self Care | Payer: Self-pay | Source: Home / Self Care | Attending: Internal Medicine

## 2023-12-04 ENCOUNTER — Ambulatory Visit
Admission: RE | Admit: 2023-12-04 | Discharge: 2023-12-04 | Disposition: A | Discharge status: Home / Self Care | Attending: Internal Medicine | Admitting: Internal Medicine

## 2023-12-04 ENCOUNTER — Other Ambulatory Visit: Payer: Self-pay

## 2023-12-04 ENCOUNTER — Encounter: Payer: Self-pay | Admitting: Internal Medicine

## 2023-12-04 DIAGNOSIS — Z8673 Personal history of transient ischemic attack (TIA), and cerebral infarction without residual deficits: Secondary | ICD-10-CM | POA: Diagnosis not present

## 2023-12-04 DIAGNOSIS — I2582 Chronic total occlusion of coronary artery: Secondary | ICD-10-CM | POA: Insufficient documentation

## 2023-12-04 DIAGNOSIS — G4733 Obstructive sleep apnea (adult) (pediatric): Secondary | ICD-10-CM | POA: Insufficient documentation

## 2023-12-04 DIAGNOSIS — Z79899 Other long term (current) drug therapy: Secondary | ICD-10-CM | POA: Insufficient documentation

## 2023-12-04 DIAGNOSIS — Z955 Presence of coronary angioplasty implant and graft: Secondary | ICD-10-CM | POA: Diagnosis not present

## 2023-12-04 DIAGNOSIS — I2510 Atherosclerotic heart disease of native coronary artery without angina pectoris: Secondary | ICD-10-CM | POA: Diagnosis not present

## 2023-12-04 DIAGNOSIS — R0602 Shortness of breath: Secondary | ICD-10-CM | POA: Diagnosis not present

## 2023-12-04 DIAGNOSIS — E785 Hyperlipidemia, unspecified: Secondary | ICD-10-CM | POA: Insufficient documentation

## 2023-12-04 DIAGNOSIS — R079 Chest pain, unspecified: Secondary | ICD-10-CM | POA: Diagnosis present

## 2023-12-04 DIAGNOSIS — E1065 Type 1 diabetes mellitus with hyperglycemia: Secondary | ICD-10-CM | POA: Insufficient documentation

## 2023-12-04 DIAGNOSIS — Z794 Long term (current) use of insulin: Secondary | ICD-10-CM | POA: Insufficient documentation

## 2023-12-04 HISTORY — PX: LEFT HEART CATH AND CORONARY ANGIOGRAPHY: CATH118249

## 2023-12-04 HISTORY — PX: CORONARY PRESSURE/FFR STUDY: CATH118243

## 2023-12-04 LAB — POCT ACTIVATED CLOTTING TIME: Activated Clotting Time: 205 s

## 2023-12-04 LAB — GLUCOSE, CAPILLARY
Glucose-Capillary: 168 mg/dL — ABNORMAL HIGH (ref 70–99)
Glucose-Capillary: 127 mg/dL — ABNORMAL HIGH (ref 70–99)

## 2023-12-04 SURGERY — LEFT HEART CATH AND CORONARY ANGIOGRAPHY
Anesthesia: Moderate Sedation

## 2023-12-04 MED ORDER — SODIUM CHLORIDE 0.9% FLUSH: 3.0000 mL | Freq: Two times a day (BID) | INTRAVENOUS | Status: DC

## 2023-12-04 MED ORDER — MIDAZOLAM HCL 2 MG/2ML IJ SOLN
INTRAMUSCULAR | Status: DC | PRN
  Administered 2023-12-04 (×2): 1 mg via INTRAVENOUS

## 2023-12-04 MED ORDER — MIDAZOLAM HCL 2 MG/2ML IJ SOLN
INTRAMUSCULAR | Status: AC
  Filled 2023-12-04: qty 2

## 2023-12-04 MED ORDER — HYDRALAZINE HCL 20 MG/ML IJ SOLN: 10.0000 mg | INTRAMUSCULAR | Status: DC | PRN

## 2023-12-04 MED ORDER — SODIUM CHLORIDE 0.9 % IV SOLN
250.0000 mL | INTRAVENOUS | Status: DC | PRN
  Administered 2023-12-04: 250 mL via INTRAVENOUS

## 2023-12-04 MED ORDER — ASPIRIN 81 MG PO CHEW
CHEWABLE_TABLET | ORAL | Status: AC
  Filled 2023-12-04: qty 1

## 2023-12-04 MED ORDER — LIDOCAINE HCL 1 % IJ SOLN
INTRAMUSCULAR | Status: AC
  Filled 2023-12-04: qty 20

## 2023-12-04 MED ORDER — ACETAMINOPHEN 325 MG PO TABS: 650.0000 mg | ORAL_TABLET | ORAL | Status: DC | PRN

## 2023-12-04 MED ORDER — ONDANSETRON HCL 4 MG/2ML IJ SOLN: 4.0000 mg | Freq: Four times a day (QID) | INTRAMUSCULAR | Status: DC | PRN

## 2023-12-04 MED ORDER — FENTANYL CITRATE (PF) 100 MCG/2ML IJ SOLN
INTRAMUSCULAR | Status: DC | PRN
  Administered 2023-12-04 (×2): 25 ug via INTRAVENOUS

## 2023-12-04 MED ORDER — VERAPAMIL HCL 2.5 MG/ML IV SOLN
INTRAVENOUS | Status: AC
  Filled 2023-12-04: qty 2

## 2023-12-04 MED ORDER — LIDOCAINE HCL (PF) 1 % IJ SOLN
INTRAMUSCULAR | Status: DC | PRN
  Administered 2023-12-04: 2 mL
  Administered 2023-12-04: 10 mL

## 2023-12-04 MED ORDER — HEPARIN SODIUM (PORCINE) 1000 UNIT/ML IJ SOLN
INTRAMUSCULAR | Status: DC | PRN
  Administered 2023-12-04: 2000 [IU] via INTRAVENOUS
  Administered 2023-12-04: 3000 [IU] via INTRAVENOUS

## 2023-12-04 MED ORDER — HEPARIN (PORCINE) IN NACL 1000-0.9 UT/500ML-% IV SOLN
INTRAVENOUS | Status: DC | PRN
  Administered 2023-12-04: 1000 mL

## 2023-12-04 MED ORDER — ASPIRIN 81 MG PO CHEW
81.0000 mg | CHEWABLE_TABLET | ORAL | Status: AC
Start: 2023-12-05 — End: 2023-12-04
  Administered 2023-12-04: 81 mg via ORAL

## 2023-12-04 MED ORDER — SODIUM CHLORIDE 0.9% FLUSH: 3.0000 mL | INTRAVENOUS | Status: DC | PRN

## 2023-12-04 MED ORDER — SODIUM CHLORIDE 0.9 % IV SOLN: 250.0000 mL | INTRAVENOUS | Status: DC | PRN

## 2023-12-04 MED ORDER — ISOSORBIDE MONONITRATE ER 30 MG PO TB24: 15.0000 mg | ORAL_TABLET | Freq: Every day | ORAL | 1 refills | Status: AC

## 2023-12-04 MED ORDER — HEPARIN (PORCINE) IN NACL 1000-0.9 UT/500ML-% IV SOLN
INTRAVENOUS | Status: AC
  Filled 2023-12-04: qty 1000

## 2023-12-04 MED ORDER — FREE WATER: 500.0000 mL | Freq: Once | Status: DC

## 2023-12-04 MED ORDER — FREE WATER
500.0000 mL | Freq: Once | Status: AC
Start: 2023-12-04 — End: 2023-12-04
  Administered 2023-12-04: 500 mL via ORAL

## 2023-12-04 MED ORDER — FENTANYL CITRATE (PF) 100 MCG/2ML IJ SOLN
INTRAMUSCULAR | Status: AC
  Filled 2023-12-04: qty 2

## 2023-12-04 MED ORDER — HEPARIN SODIUM (PORCINE) 1000 UNIT/ML IJ SOLN
INTRAMUSCULAR | Status: AC
  Filled 2023-12-04: qty 10

## 2023-12-04 MED ORDER — IOHEXOL 300 MG/ML  SOLN
INTRAMUSCULAR | Status: DC | PRN
  Administered 2023-12-04: 110 mL

## 2023-12-04 SURGICAL SUPPLY — 19 items
CATH INFINITI 5FR MULTPACK ANG (CATHETERS) IMPLANT
CATH VISTA GUIDE 6FR XB3 MULPK (CATHETERS) IMPLANT
DEVICE CLOSURE MYNXGRIP 5F (Vascular Products) IMPLANT
DEVICE CLOSURE MYNXGRIP 6/7F (Vascular Products) IMPLANT
DRAPE BRACHIAL (DRAPES) IMPLANT
GLIDESHEATH SLEND SS 6F .021 (SHEATH) IMPLANT
GUIDEWIRE INQWIRE 1.5J.035X260 (WIRE) IMPLANT
GUIDEWIRE PRESSURE X 175 (WIRE) IMPLANT
KIT ESSENTIALS PG (KITS) IMPLANT
NDL PERC 18GX7CM (NEEDLE) IMPLANT
NEEDLE PERC 18GX7CM (NEEDLE) ×2 IMPLANT
PACK CARDIAC CATH (CUSTOM PROCEDURE TRAY) ×2 IMPLANT
PAD SORBX EP SHIELD 16.5X12 (MISCELLANEOUS) IMPLANT
SET ATX-X65L (MISCELLANEOUS) IMPLANT
SHEATH AVANTI 5FR X 11CM (SHEATH) IMPLANT
SHEATH AVANTI 6FR X 11CM (SHEATH) IMPLANT
STATION PROTECTION PRESSURIZED (MISCELLANEOUS) IMPLANT
TUBING CIL FLEX 10 FLL-RA (TUBING) IMPLANT
WIRE EMERALD 3MM-J .035X150CM (WIRE) IMPLANT

## 2023-12-10 ENCOUNTER — Encounter: Payer: Self-pay | Admitting: Internal Medicine

## 2023-12-10 LAB — CARDIAC CATHETERIZATION: Cath EF Quantitative: 55 %

## 2024-01-08 NOTE — Progress Notes (Signed)
 Primary care physician: Eliverto Bette Hover, MD.  Reason for consultation: Severe CAD.  History of Present Illness Cheyenne Lopez is a 73 year old female with coronary artery disease who presents with exertional dyspnea for interventional cardiology consultation. She is accompanied by her son. She was referred by Dr. Florencio for evaluation of coronary artery disease.  She experiences shortness of breath when walking uphill, necessitating frequent stops to rest. This symptom has persisted since her myocardial infarction.  She recently underwent a cardiac catheterization, which revealed a significant blockage in one of her arteries.  She has a history of a heart attack in 2021, during which an attempt to place stents was unsuccessful due to the complexity of the blockage.  She manages her condition with medications, but no recent changes have been made to her regimen. She was prescribed isosorbide  but did not take it due to concerns about potential headaches.  She has a long-standing history of diabetes, having been insulin -dependent for over fifty years. Her diabetes is described as 'brittle,' indicating difficulty in maintaining stable blood glucose levels.  She is a Scientist, Product/process Development, which is relevant to her medical care preferences.  Review of systems Twelve point review of systems was completed and was non-contributory except as noted above in the history of present illness.  Patient Active Problem List  Diagnosis  . Type 1 diabetes mellitus (CMS/HHS-HCC)  . Hypothyroidism  . Insulin  pump status  . Abdominal bloating  . Constipation  . Hx of adenomatous colonic polyps  . OSA on CPAP  . Refusal of blood transfusions as patient is Jehovah's Witness  . Advance directive on file  . Combined forms of age-related cataract of both eyes  . Lateral epicondylitis  . Shoulder pain  . Numbness and tingling in left hand  . Numbness and tingling of right arm  . Pain of right arm  .  Neuropathic pain of both legs  . Weakness of both hands  . Numbness and tingling in both hands  . Non-ST elevation myocardial infarction (NSTEMI) (CMS/HHS-HCC)  . Coronary artery disease involving native coronary artery of native heart without angina pectoris  . Acute lower UTI  . Chest pain  . Elevated blood-pressure reading without diagnosis of hypertension  . Anatomical narrow angle of both eyes  . Age-related nuclear cataract of both eyes  . Open angle with borderline findings and low glaucoma risk in both eyes  . Type 1 diabetes mellitus without retinopathy (CMS/HHS-HCC)  . Bilateral ocular hypertension  . Conjunctival hyperemia of both eyes  . Carpal tunnel syndrome of right wrist  . Statin intolerance   Current Outpatient Medications  Medication Sig Dispense Refill  . acetone, urine, test strip Use as directed to check ketones during elevated BG reading 20 each 6  . aspirin  81 MG chewable tablet Take 81 mg by mouth once daily    . blood-glucose sensor (DEXCOM G7 SENSOR) Devi Use 1 each every 10 (ten) days 9 each 3  . calcium -mag oxide-vitamin D3 185 mg-50 mg- 2.5 mcg Cap Take 1 tablet by mouth once daily    . glucagon (GVOKE HYPOPEN 2-PACK) 1 mg/0.2 mL auto-injector Inject 0.2 mLs (1 mg total) subcutaneously as directed for Low blood sugar 0.4 mL 11  . ibuprofen (MOTRIN) 200 MG tablet Take 400 mg by mouth every 6 (six) hours as needed    . insulin  LISPRO (HUMALOG U-100 INSULIN ) injection (concentration 100 units/mL) INJECT UP TO 50 UNITS DAILY VIA INSULIN  PUMP 50 mL 3  .  insulin  syringe-needle U-100 (BD INSULIN  SYRINGE ULTRA-FINE) 0.3 mL 31 gauge x 5/16 syringe Use 4 times daily as directed 100 each 3  . LANTUS SOLOSTAR U-100 INSULIN  pen injector (concentration 100 units/mL) Inject 10 u once daily for off pump plan 15 mL 11  . levothyroxine (SYNTHROID) 75 MCG tablet Take 1 tablet (75 mcg total) by mouth once daily for 90 days 90 tablet 3  . liothyronine (CYTOMEL) 5 MCG tablet  Take 1 tablet (5 mcg total) by mouth once daily for 90 days 90 tablet 3  . multivitamin tablet Take 1 tablet by mouth once daily    . nitroGLYcerin  (NITROSTAT ) 0.4 MG SL tablet Place 1 tablet (0.4 mg total) under the tongue every 5 (five) minutes as needed for Chest pain May take up to 3 doses. 25 tablet 3  . NON FORMULARY Take 1 tablet by mouth once daily L-THEANINE    . OMNIPOD 5 G6-G7 PODS, GEN 5, Crtg Inject 1 Device subcutaneously every other day 45 each 3  . pen needle, diabetic (BD ULTRA-FINE NANO PEN NEEDLE) 32 gauge x 5/32 Ndle Use daily as directed for off pump plan 100 each 11  . red yeast rice 600 mg Cap Take 2 capsules by mouth    . travoprost (TRAVATAN Z) 0.004 % Ophth ophthalmic solution Place 1 drop into both eyes at bedtime 7.5 mL 3  . traZODone (DESYREL) 50 MG tablet Take 0.5 tablets (25 mg total) by mouth at bedtime as needed for Sleep 90 tablet 1  . valsartan (DIOVAN) 40 MG tablet Take 0.5 tablets (20 mg total) by mouth once daily 45 tablet 3   No current facility-administered medications for this visit.   Allergies  Allergen Reactions  . Zetia [Ezetimibe] Other (See Comments)    Pruritus   . Novolog  [Insulin  Aspart] Other (See Comments)    Novolog  causes hyperglycemia  . Crestor [Rosuvastatin] Other (See Comments)    Patient stated she could not walk when on this medication.  . Lipitor  [Atorvastatin ] Muscle Pain   Past Medical History:  Diagnosis Date  . Advance directive on file 09/29/2014  . Allergic state seasonal  . Combined forms of age-related cataract of both eyes 06/28/2015  . Coronary artery disease 2021   a stent was put in  . Diabetes mellitus (CMS/HHS-HCC)    on insulin  pump  . Diabetes mellitus type I (CMS/HHS-HCC)   . GERD (gastroesophageal reflux disease)   . Glaucoma   . Hx of adenomatous colonic polyps   . Hypertension complicating diabetes (CMS/HHS-HCC) 07/16/2019   Patient placed on HBP medication for kidney's due to diabetes  .  Hypothyroidism   . Lateral epicondylitis 05/01/2016  . PONV (postoperative nausea and vomiting)   . Refusal of blood transfusions as patient is Jehovah's Witness 09/29/2014  . Sleep apnea    Past Surgical History:  Procedure Laterality Date  . D&C  2003  . ANAL FISSURECTOMY  2004  . COLONOSCOPY  2004   Large adenomatous polyps required resection, multliple annual colonoscopies  . URETHROPEXY ANTERIOR MMK/BURCH ABDOMINAL  2007  . COLONOSCOPY  2011   5 year recommended  . COLONOSCOPY  08/15/2009   3 mm rectal polyp.  Internal hemorrhoids.  Normal TI  . EGD  08/15/2009   Grade A esophagitis, mild patchy antral erythema, single duodenal polyp  . COLONOSCOPY W/BIOPSY N/A 10/13/2014   Procedure: SURVEILLANCE COLONOSCOPY;  Surgeon: Charlie Franky Debarah Mickey., MD;  Location: [facility];  Service: Gastroenterology;  Laterality: N/A;  .  COLONOSCOPY W/BIOPSY N/A 07/20/2020   Procedure: COLONOSCOPY, FLEXIBLE; WITH BIOPSY, SINGLE OR MULTIPLE;  Surgeon: Debarah Charlie Franky Mickey., MD;  Location: [facility];  Service: Gastroenterology;  Laterality: N/A;  . CARDIAC CATHETERIZATION    . CORONARY ARTERY BYPASS GRAFT    . TONSILLECTOMY     Social history She reports that she has never smoked. She has never used smokeless tobacco. She reports that she does not currently use alcohol. She reports that she does not use drugs.  Family History  Problem Relation Name Age of Onset  . Lupus Mother Delia   . Thyroid  disease Mother Delia   . Alzheimer's disease Mother Delia   . Glaucoma Mother Delia   . Stroke Mother Delia   . Hyperthyroidism Mother Delia   . Rheum arthritis Mother Delia   . Neuropathy Father Veronia   . Glaucoma Father Veronia   . Hyperlipidemia (Elevated cholesterol) Father Veronia   . Colon polyps Father Veronia   . Diabetes Father Veronia   . High blood pressure (Hypertension) Father Veronia   . High blood pressure (Hypertension) Brother    . Hyperlipidemia (Elevated  cholesterol) Son    . High blood pressure (Hypertension) Son    . Colon cancer Paternal Aunt    . Alzheimer's disease Maternal Grandmother Ana   . Glaucoma Maternal Grandmother Ana   . Thyroid  disease Maternal Grandmother Ana   . Stroke Maternal Grandmother Ana   . Hypothyroidism Maternal Grandmother Ana   . Colon cancer Other Pat cousin   . Esophageal cancer Neg Hx    . Stomach cancer Neg Hx    . Celiac disease Neg Hx    . Anesthesia problems Neg Hx    . Malignant hypertension Neg Hx    . Pseudochol deficiency Neg Hx    . Macular degeneration Neg Hx    . Malignant hyperthermia Neg Hx    . PONV Neg Hx      PHYSICAL EXAMINATION:  BP 116/64   Pulse 60   Ht 152.4 cm (5')   Wt 58.1 kg (128 lb)   SpO2 99%   BMI 25.00 kg/m   General Appearance:  Alert, cooperative, no distress, pleasant  HEENT:  Sclera nonicteric  Neck: Carotids 2+ bilaterally, no carotid bruits, No JVD, no HJR  No thyromegaly noted  Lungs:   Clear to auscultation bilaterally, respirations unlabored  Heart:  Regular rhythm Normal S1 and S2  No murmurs No rubs/gallops Non-displaced PMI  Abdomen:   Soft, non-tender, bowel sounds active, no bruit  Extremities: Extremities normal, no cyanosis No LE edema  Pulses: PTs 1+ symmetric bilaterally  Skin: No lower extremity rashes or ulcers  Neurologic: Alert, interactive, and appropriate, grossly moving all 4 extremities   LABS:   Last 3 CBC results: Lab Results  Component Value Date   WBC 3.7 (L) 11/27/2023   WBC 4.3 04/03/2023   WBC 4.6 01/17/2022   Lab Results  Component Value Date   HGB 12.4 11/27/2023   HGB 11.7 04/03/2023   HGB 12.5 01/17/2022   Lab Results  Component Value Date   HCT 37.7 11/27/2023   HCT 35.1 04/03/2023   HCT 38.6 01/17/2022   Lab Results  Component Value Date   MCV 99.2 11/27/2023   MCV 101 (H) 04/03/2023   MCV 103 (H) 01/17/2022   Lab Results  Component Value Date   PLT 181 11/27/2023   PLT 183 04/03/2023   PLT  193 01/17/2022   Lab Results  Component Value Date   CREATININE 0.7 11/27/2023   BUN 11 11/27/2023   NA 142 11/27/2023   K 3.9 11/27/2023   CL 106 11/27/2023   CO2 30.6 11/27/2023   Lab Results  Component Value Date   HGBA1C 6.9 (H) 11/01/2023   Lab Results  Component Value Date   CHOLTOTAL 158 11/01/2023   CHOLTOTAL 159 03/12/2023   Lab Results  Component Value Date   HDL 72 11/01/2023   HDL 64 03/12/2023   HDL 65 01/17/2022   Lab Results  Component Value Date   LDLCALC 75 11/01/2023   LDLCALC 80 03/12/2023   Lab Results  Component Value Date   TRIG 55 11/01/2023   TRIG 76 03/12/2023   Lab Results  Component Value Date   ALT 22 11/27/2023   AST 21 11/27/2023   ALKPHOS 82 11/27/2023   Assessment & Plan 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 Previous stent placement attempts were unsuccessful. The blockage is short and fed by collaterals, making it favorable for intervention. Discussed PCI with a high likelihood of success (>90%) and minimal risk. Explained procedure details, including access points, contrast dye use, minimal bleeding risk, and diabetes impact on stent restenosis. Emphasized potential symptom and quality of life improvement. Agreed to proceed with intervention. - Schedule PCI at [facility] with Dr. Katina. - Use wrist access initially, with potential groin access if needed. - Ensure she is informed about contrast dye use and minimal bleeding risk. - Coordinate with [facility] scheduling staff for procedure date.  Type 1 diabetes mellitus, long-standing, insulin -dependent Long-standing insulin -dependent diabetes mellitus, described as brittle. Discussed diabetes impact on healing and potential for stent restenosis due to scar tissue formation.  These notes generated with voice recognition software.  I apologize for  typographical errors.  Murray FURY Katina, MD, [facility], FSC[facility]

## 2024-02-04 NOTE — Progress Notes (Signed)
 GYN Clinic Note   CC: annual exam and bulge in vagina   HPI: Cheyenne Lopez [age] y.o. [phone] with a hx of hypothyroidism, T1DM, OSA, CAD, osteopenia, HTN who presents for an annual exam and to discuss a bulge in the vagina.  Last GYN visit: [facility] 2006  Per chart review, last saw [facility] urogyn on 04/01/18. No prolapse noted. They referred to pelvic floor PT, prescribed oxybutynin, and vaginal estradiol.   Reports vaginal bump 2x in the past year. Feels like a ball right there. This time, pushed it back and it has not been a problem since.  Breast Health Screening Breast concerns or complaints: no History of previous breast biopsy or other breast surgery: no Personal history of breast cancer: no    Genitourinary Screening  Urinary complaints or concerns: +urgency, rare urge incontinence. Denies stress incontinence, dysuria, hematuria.   Menopause symptoms: +vaginal dryness and dyspareunia. Reports rare intercourse due to pain. When has intercourse reports spotting right after. Denies bleeding otherwise.   PCP: Cheyenne FORBES HOUGH, MD   ROS: All other systems reviewed and negative   PMHx: Past Medical History:  Diagnosis Date  . Advance directive on file 09/29/2014  . Allergic state seasonal  . Combined forms of age-related cataract of both eyes 06/28/2015  . Coronary artery disease 2021   a stent was put in  . Diabetes mellitus (CMS/HHS-HCC)    on insulin  pump  . Diabetes mellitus type I (CMS/HHS-HCC)   . GERD (gastroesophageal reflux disease)   . Glaucoma   . History of myocardial infarction 2021   Stent  . Hx of adenomatous colonic polyps   . Hypertension complicating diabetes (CMS/HHS-HCC) 07/16/2019   Patient placed on HBP medication for kidney's due to diabetes  . Hypothyroidism   . Lateral epicondylitis 05/01/2016  . PONV (postoperative nausea and vomiting)   . Refusal of blood transfusions as patient is Jehov[employer]'s Witness 09/29/2014  . Sleep apnea       PSHx: Past  Surgical History:  Procedure Laterality Date  . D&C  2003  . ANAL FISSURECTOMY  2004  . COLONOSCOPY  2004   Large adenomatous polyps required resection, multliple annual colonoscopies  . URETHROPEXY ANTERIOR MMK/BURCH ABDOMINAL  2007  . COLONOSCOPY  2011   5 year recommended  . COLONOSCOPY  08/15/2009   3 mm rectal polyp.  Internal hemorrhoids.  Normal TI  . EGD  08/15/2009   Grade A esophagitis, mild patchy antral erythema, single duodenal polyp  . COLONOSCOPY W/BIOPSY N/A 10/13/2014   Procedure: SURVEILLANCE COLONOSCOPY;  Surgeon: Charlie Franky Debarah Mickey., MD;  Location: [facility];  Service: Gastroenterology;  Laterality: N/A;  . COLONOSCOPY W/BIOPSY N/A 07/20/2020   Procedure: COLONOSCOPY, FLEXIBLE; WITH BIOPSY, SINGLE OR MULTIPLE;  Surgeon: Debarah Charlie Franky Mickey., MD;  Location: [facility];  Service: Gastroenterology;  Laterality: N/A;  . CARDIAC CATHETERIZATION    . CORONARY ARTERY BYPASS GRAFT    . TONSILLECTOMY       OBHx: OB History  Gravida Para Term Preterm AB Living  3 3 3   3   SAB IAB Ectopic Molar Multiple Live Births           # Outcome Date GA Lbr Len/2nd Weight Sex Type Anes PTL Lv  3 Term           2 Term           1 Term             Obstetric Comments  spontaneous vaginal delivery x3, largest 7lbs7oz     GYN Hx: - LMP: No LMP recorded. Patient is postmenopausal.  - Menopause: age 66, never been on HRT, denies PMB - Pap hx: Denies any history of abnormals, last 04/01/18 NILM with neg HRHPV. Never had any excisional procedures - STI hx: Denies any history of STIs  - Sexual preference: Sexually active with husband - IPV screening: Feels safe at home/ in relationship - Dyspareunia or sexual concerns:yes - Abdominal surgeries: midurethral cadaver sling 2007 - GYN procedures: D&c     FHx: Denies FHx of ovarian, breast, uterine, cervical, and colon cancer   Meds: Current Outpatient Medications on File Prior to Visit  Medication Sig  Dispense Refill  . acetone, urine, test strip Use as directed to check ketones during elevated BG reading 20 each 6  . aspirin  81 MG chewable tablet Take 81 mg by mouth once daily    . blood-glucose sensor (DEXCOM G7 SENSOR) Devi Use 1 each every 10 (ten) days 9 each 3  . calcium -mag oxide-vitamin D3 185 mg-50 mg- 2.5 mcg Cap Take 1 tablet by mouth once daily    . glucagon (GVOKE HYPOPEN 2-PACK) 1 mg/0.2 mL auto-injector Inject 0.2 mLs (1 mg total) subcutaneously as directed for Low blood sugar 0.4 mL 11  . ibuprofen (MOTRIN) 200 MG tablet Take 400 mg by mouth every 6 (six) hours as needed    . insulin  LISPRO (HUMALOG U-100 INSULIN ) injection (concentration 100 units/mL) INJECT UP TO 50 UNITS DAILY VIA INSULIN  PUMP 50 mL 3  . insulin  syringe-needle U-100 (BD INSULIN  SYRINGE ULTRA-FINE) 0.3 mL 31 gauge x 5/16 syringe Use 4 times daily as directed 100 each 3  . LANTUS SOLOSTAR U-100 INSULIN  pen injector (concentration 100 units/mL) Inject 10 u once daily for off pump plan 15 mL 11  . multivitamin tablet Take 1 tablet by mouth once daily    . nitroGLYcerin  (NITROSTAT ) 0.4 MG SL tablet Place 1 tablet (0.4 mg total) under the tongue every 5 (five) minutes as needed for Chest pain May take up to 3 doses. 25 tablet 3  . NON FORMULARY Take 1 tablet by mouth once daily L-THEANINE    . OMNIPOD 5 G6-G7 PODS, GEN 5, Crtg Inject 1 Device subcutaneously every other day 45 each 3  . pen needle, diabetic (BD ULTRA-FINE NANO PEN NEEDLE) 32 gauge x 5/32 Ndle Use daily as directed for off pump plan 100 each 11  . red yeast rice 600 mg Cap Take 2 capsules by mouth    . travoprost (TRAVATAN Z) 0.004 % Ophth ophthalmic solution Place 1 drop into both eyes at bedtime 7.5 mL 3  . traZODone (DESYREL) 50 MG tablet Take 0.5 tablets (25 mg total) by mouth at bedtime as needed for Sleep 90 tablet 1  . valsartan (DIOVAN) 40 MG tablet Take 0.5 tablets (20 mg total) by mouth once daily 45 tablet 3  . levothyroxine (SYNTHROID)  75 MCG tablet Take 1 tablet (75 mcg total) by mouth once daily for 90 days 90 tablet 3  . liothyronine (CYTOMEL) 5 MCG tablet Take 1 tablet (5 mcg total) by mouth once daily for 90 days 90 tablet 3   No current facility-administered medications on file prior to visit.     Allergies: Allergies  Allergen Reactions  . Zetia [Ezetimibe] Other (See Comments)    Pruritus   . Novolog  [Insulin  Aspart] Other (See Comments)    Novolog  causes hyperglycemia  . Crestor [Rosuvastatin] Other (See Comments)  Patient stated she could not walk when on this medication.  . Lipitor  [Atorvastatin ] Muscle Pain     SocHx: Social History   Tobacco Use  . Smoking status: Never  . Smokeless tobacco: Never  Vaping Use  . Vaping status: Never Used  Substance Use Topics  . Alcohol use: Not Currently    Alcohol/week: 4.0 standard drinks of alcohol    Types: 4 Glasses of wine per week    Comment: occasion glass of wine  . Drug use: No      OBJECTIVE: BP (!) 149/76 (BP Location: Left upper arm, Patient Position: Sitting, BP Cuff Size: Large Adult)   Pulse 59   Ht 152.4 cm (5')   Wt 58.5 kg (129 lb)   BMI 25.19 kg/m    Gen: NAD HEENT: Cheyenne Lopez/AT Breasts: breasts appear normal in both sitting and supine positions, no suspicious masses, no skin or nipple changes or axillary nodes.  Heart: Regular rate Lungs: Normal work of breathing Abdomen: soft, nontender, nondistended Ext: No BLE edema Pelvic exam: Cheyenne Burkes, LPN present as chaperone Normal external female genitalia without lesions;  Normal bartholins/skenes glands, normal urethral meatus;  Vaginal mucosa pale pink and dry with decreased rugae, no discharge or blood in the vault;  Cervix is normal without lesions, polyp visible coming from os Bimanual exam:     Unable to palpate uterus     No adnexal masses or tenderness  Empty supine cough test: neg No flatal or fecal incontinence  POPQ   Aa -3 Ba -3 C -8  PB 3 GH 2 TVL 9  Ap -3  Bp -3 D -8   Cervical Polyp Procedure Note:  The cervix was prepped with povidone iodine x3.  The cervical polyp was grasped with ringed forceps and twisting motion used to dislodge the polyp at the base. Specimen sent to pathology. The speculum was removed.    Chaperone present for pelvic exam.  ASSESSMENT/PLAN:  Cheyenne Lopez 73 y.o. [phone] with a hx of hypothyroidism, T1DM, OSA, CAD, osteopenia, HTN who presents for an annual exam and to discuss a bulge in the vagina.   #Annual visit - Breast exam wnl - Pelvic exam with atrophy and cervical polyp - Depression screening: negative - SDOH screening: negative  #Vaginal bulge - No prolapse on exam today  #Cervical polyp - Removed and sent to pathology - Discussed that the majority of polyps are benign but rarely they are malignant (1-5% malignancy depending on risk factors)  #Postcoital postmenopausal bleeding - Discussed various possible causes including cervical polyp, cervical or vaginal hyperplasia or malignancy, vaginal atrophy, and uterine pathology (polyp, fibroid, hyperplasia, malignancy) - Recommended pelvic u/s and EMB. Patient reports previous ultrasounds have been very painful due to dryness and concerned about pain so does not want to proceed at this point. - Discussed importance of further evaluation and after shared decision making, decided to proceed with a 3 month course of vaginal estrogen for atrophy and then return for u/s. Discussed if continued bleeding or u/s concerning, would recommend EMB at that time and patient amenable. Desires premedication with valium- rx sent.   #Genitourinary syndrome of menopause (GUSM) - Pelvic exam showed atrophy - Discussed use of vaginal moisturizers - Discussed use of lubricants with intercourse - Provided handouts on vaginal dryness and GUSM - Recommended treatment with vaginal estrogen. Discussed that systemic absorption is variable but thought to be minimal. Studies have not shown  an increased risk of stroke, VTE, CAD, breast cancer, or endometrial  cancer with vaginal estrogen. - Rx vaginal estradiol 1 g q2/week.   #Urge incontinence - Hx of urethral sling - Last saw urogyn 2020 and was referred to pelvic floor PT but did not go - Interested in trying pelvic floor PT - Provided handouts on overactive bladder and pelvic floor exercises - Discussed that if still bothersome despite PT, would recommend returning to urogyn  #Low bone mass - 04/26/22 DEXA showed low bone mass - Discussed recommendation for 800 IU vitamin D and 1200 mg calcium  - Discussed importance of adequate protein intake (goal 100g) and exercise (combination of cardio including walking regularly and strength training)  #Health maintenance: - Cervical cancer screening: Pap collected today, discussed if normal  can discontinue paps as patient is >65 with adequate previous screening and not at increased risk of cervical cancer  - Breast cancer screening: UTD, birads 1 on 05/01/23, next due Feb 2025, mammogram ordered February - Colon cancer screening: UTD, last 07/20/20, next due April 2027 - Defer remainder to PCP  RTC 3 months for u/s and possible EMB    DEMETRA DICIE DINSMORE, MD

## 2024-02-14 ENCOUNTER — Emergency Department
Admission: EM | Admit: 2024-02-14 | Discharge: 2024-02-15 | Disposition: A | Discharge status: Home / Self Care | Attending: Emergency Medicine | Admitting: Emergency Medicine

## 2024-02-14 ENCOUNTER — Other Ambulatory Visit: Payer: Self-pay

## 2024-02-14 DIAGNOSIS — I97618 Postprocedural hemorrhage and hematoma of a circulatory system organ or structure following other circulatory system procedure: Secondary | ICD-10-CM | POA: Insufficient documentation

## 2024-02-14 DIAGNOSIS — Z95820 Peripheral vascular angioplasty status with implants and grafts: Secondary | ICD-10-CM | POA: Insufficient documentation

## 2024-02-14 DIAGNOSIS — R58 Hemorrhage, not elsewhere classified: Secondary | ICD-10-CM

## 2024-02-14 NOTE — ED Provider Notes (Signed)
 [facility] Provider Note    Event Date/Time   First MD Initiated Contact with Patient 02/14/24 2333     (approximate)   History   Post-op Problem   HPI Cheyenne Lopez is a 73 y.o. female who presents for bleeding from a left radial artery catheterization site.  Earlier today at around 9 AM she had a left heart cath at [facility].  She had a couple of stents placed and was discharged around 4 PM.  She is on dual antiplatelet therapy and was told that if she has any bleeding from the site they should go immediately to the nearest emergency department.  The patient and her husband report that she soaked through the bandage that was in place and she tried holding it up and apply a little bit more pressure but given that she soaked through they came immediately to the emergency department.  She is not lightheaded or dizzy and is ambulating without difficulty.  She is not having any chest pain.     Physical Exam   Triage Vital Signs: ED Triage Vitals  Encounter Vitals Group     BP 02/14/24 2134 (!) 154/45     Girls Systolic BP Percentile --      Girls Diastolic BP Percentile --      Boys Systolic BP Percentile --      Boys Diastolic BP Percentile --      Pulse Rate 02/14/24 2131 (!) 52     Resp 02/14/24 2131 18     Temp 02/14/24 2131 98.4 F (36.9 C)     Temp Source 02/14/24 2131 Oral     SpO2 02/14/24 2131 96 %     Weight 02/14/24 2132 57.2 kg (126 lb)     Height 02/14/24 2132 1.524 m (5')     Head Circumference --      Peak Flow --      Pain Score 02/14/24 2132 0     Pain Loc --      Pain Education --      Exclude from Growth Chart --     Most recent vital signs: Vitals:   02/14/24 2131 02/14/24 2134  BP:  (!) 154/45  Pulse: (!) 52   Resp: 18   Temp: 98.4 F (36.9 C)   SpO2: 96%     General: Awake, no distress.  CV:  Good peripheral perfusion.  Easily palpable bilateral radial pulses with good perfusion and fingers of both hands.  After  I removed the pressure dressing placed in triage, I can appreciate a small puncture wound in the left radial artery that was oozing a small drop of blood.  See ED course for additional details. Resp:  Normal effort. Speaking easily and comfortably, no accessory muscle usage nor intercostal retractions.   Abd:  No distention.    ED Results / Procedures / Treatments   Labs (all labs ordered are listed, but only abnormal results are displayed) Labs Reviewed - No data to display   PROCEDURES:  Critical Care performed: No  Procedures    IMPRESSION / MDM / ASSESSMENT AND PLAN / ED COURSE  I reviewed the triage vital signs and the nursing notes.                              Differential diagnosis includes, but is not limited to, postprocedural bleeding, vascular disruption or dissection.  Patient's presentation is most consistent  with acute presentation with potential threat to life or bodily function.    Interventions/Medications given:  Medications - No data to display  (Note:  hospital course my include additional interventions and/or labs/studies not listed above.)   Vital signs normal other than mild bradycardia.  Patient is in no distress and asymptomatic other than the bleeding.  I did not see the initial dressing because it been replaced with a pressure dressing in triage.  I removed that and she used a small drop of blood from the site, certainly no steady stream of blood and no pulsatile bleeding.  There is no visible or palpable hematoma at the site.  I have applied a folded up layer of Surgicel to the puncture wound and then reapplied a layer of gauze wrapped with Coban and made a tight enough that it would apply firm pressure but not cut off the circulation to her fingers.  She was comfortable with the dressing.  I will leave in place for period of time and see if she continues to bleed.  She and her husband are comfortable with the plan for following up as an outpatient  if there is no evidence of additional bleeding.  There is no indication for checking blood work.  Of note, the patient is a [employer] and refuses blood products regardless, even if she has had some significant blood loss.  However, I have observed her getting up from a supine position and immediately walking to the bathroom without any indication that she is feeling orthostatic     Clinical Course as of 02/15/24 0232  Sat Feb 15, 2024  0053 No additional bleeding, no symptoms, patient feels well.  Will discharge for close outpatient follow-up. [CF]    Clinical Course User Index [CF] Gordan Huxley, MD     FINAL CLINICAL IMPRESSION(S) / ED DIAGNOSES   Final diagnoses:  Bleeding     Rx / DC Orders   ED Discharge Orders     None        Note:  This document was prepared using Dragon voice recognition software and may include unintentional dictation errors.   Gordan Huxley, MD 02/15/24 [phone]

## 2024-02-14 NOTE — ED Notes (Signed)
 Patient's dressing for cath site bleeding through. This RN removed and pressure wrapped. Site is clean, no hematoma or bruising present. Pulses intact.

## 2024-02-14 NOTE — ED Triage Notes (Signed)
 Pt presents for continued bleeding from cathertization site on left wrist. Denies pain. Procedure was at 0900 today. Dressing has not been changed since. Recently started on 324 aspirin  and Plavix.

## 2024-02-15 NOTE — Discharge Instructions (Signed)
 Keep the pressure dressing in place unless it starts bleeding again and saturates the dressing.  However, if that is the case, you should return to the nearest emergency department.  Try not to get the dressing wet.  Starting tomorrow, follow-up the recommendations for wound management that you were provided by your cardiologist.

## 2024-03-03 ENCOUNTER — Encounter

## 2024-03-03 VITALS — Ht 60.5 in | Wt 129.9 lb

## 2024-03-03 DIAGNOSIS — Z48812 Encounter for surgical aftercare following surgery on the circulatory system: Secondary | ICD-10-CM | POA: Insufficient documentation

## 2024-03-03 DIAGNOSIS — Z955 Presence of coronary angioplasty implant and graft: Secondary | ICD-10-CM | POA: Insufficient documentation

## 2024-03-03 NOTE — Patient Instructions (Signed)
 Patient Instructions  Patient Details  Name: Cheyenne Lopez MRN: [id number] Date of Birth: [birth year] Referring Provider:  Florencio Cara BIRCH, MD  Below are your personal goals for exercise, nutrition, and risk factors. Our goal is to help you stay on track towards obtaining and maintaining these goals. We will be discussing your progress on these goals with you throughout the program.  Initial Exercise Prescription:  Initial Exercise Prescription - 03/03/24 1500       Date of Initial Exercise RX and Referring Provider   Date 03/03/24    Referring Provider Dr. Cara Florencio      Oxygen   Maintain Oxygen Saturation 88% or higher      Treadmill   MPH 2.7    Grade 0    Minutes 15    METs 2.8      Recumbant Bike   Level 3    RPM 50    Watts 25    Minutes 15    METs 2.8      NuStep   Level 3    SPM 80    Minutes 15    METs 2.8      Elliptical   Level 1    Speed 3    Minutes 15    METs 2.8      Prescription Details   Duration Progress to 30 minutes of continuous aerobic without signs/symptoms of physical distress      Intensity   THRR 40-80% of Max Heartrate 93-129    Ratings of Perceived Exertion 11-13    Perceived Dyspnea 0-4      Progression   Progression Continue to progress workloads to maintain intensity without signs/symptoms of physical distress.      Resistance Training   Training Prescription Yes    Weight 3lb    Reps 10-15          Exercise Goals: Frequency: Be able to perform aerobic exercise two to three times per week in program working toward 2-5 days per week of home exercise.  Intensity: Work with a perceived exertion of 11 (fairly light) - 15 (hard) while following your exercise prescription.  We will make changes to your prescription with you as you progress through the program.   Duration: Be able to do 30 to 45 minutes of continuous aerobic exercise in addition to a 5 minute warm-up and a 5 minute cool-down routine.    Nutrition Goals: Your personal nutrition goals will be established when you do your nutrition analysis with the dietician.  The following are general nutrition guidelines to follow: Cholesterol < 200mg /day Sodium < 1500mg /day Fiber: Women over 50 yrs - 21 grams per day  Personal Goals:  Personal Goals and Risk Factors at Admission - 03/03/24 1346       Core Components/Risk Factors/Patient Goals on Admission    Weight Management Yes;Weight Loss    Goal Weight: Long Term 120 lb (54.4 kg)    Expected Outcomes Short Term: Continue to assess and modify interventions until short term weight is achieved;Weight Loss: Understanding of general recommendations for a balanced deficit meal plan, which promotes 1-2 lb weight loss per week and includes a negative energy balance of [id number] kcal/d;Understanding recommendations for meals to include 15-35% energy as protein, 25-35% energy from fat, 35-60% energy from carbohydrates, less than 200mg  of dietary cholesterol, 20-35 gm of total fiber daily;Understanding of distribution of calorie intake throughout the day with the consumption of 4-5 meals/snacks    Diabetes Yes  Intervention Provide education about signs/symptoms and action to take for hypo/hyperglycemia.;Provide education about proper nutrition, including hydration, and aerobic/resistive exercise prescription along with prescribed medications to achieve blood glucose in normal ranges: Fasting glucose 65-99 mg/dL    Expected Outcomes Short Term: Participant verbalizes understanding of the signs/symptoms and immediate care of hyper/hypoglycemia, proper foot care and importance of medication, aerobic/resistive exercise and nutrition plan for blood glucose control.;Long Term: Attainment of HbA1C < 7%.    Hypertension Yes    Intervention Provide education on lifestyle modifcations including regular physical activity/exercise, weight management, moderate sodium restriction and increased consumption of  fresh fruit, vegetables, and low fat dairy, alcohol moderation, and smoking cessation.;Monitor prescription use compliance.    Expected Outcomes Short Term: Continued assessment and intervention until BP is < 140/55mm HG in hypertensive participants. < 130/66mm HG in hypertensive participants with diabetes, heart failure or chronic kidney disease.;Long Term: Maintenance of blood pressure at goal levels.    Lipids Yes    Intervention Provide education and support for participant on nutrition & aerobic/resistive exercise along with prescribed medications to achieve LDL 70mg , HDL >40mg .    Expected Outcomes Short Term: Participant states understanding of desired cholesterol values and is compliant with medications prescribed. Participant is following exercise prescription and nutrition guidelines.;Long Term: Cholesterol controlled with medications as prescribed, with individualized exercise RX and with personalized nutrition plan. Value goals: LDL < 70mg , HDL > 40 mg.          Tobacco Use Initial Evaluation: Social History   Tobacco Use  Smoking Status Never  Smokeless Tobacco Never    Exercise Goals and Review:  Exercise Goals     Row Name 03/03/24 1532             Exercise Goals   Increase Physical Activity Yes       Intervention Provide advice, education, support and counseling about physical activity/exercise needs.;Develop an individualized exercise prescription for aerobic and resistive training based on initial evaluation findings, risk stratification, comorbidities and participant's personal goals.       Expected Outcomes Short Term: Attend rehab on a regular basis to increase amount of physical activity.;Long Term: Add in home exercise to make exercise part of routine and to increase amount of physical activity.;Long Term: Exercising regularly at least 3-5 days a week.       Increase Strength and Stamina Yes       Intervention Provide advice, education, support and counseling  about physical activity/exercise needs.;Develop an individualized exercise prescription for aerobic and resistive training based on initial evaluation findings, risk stratification, comorbidities and participant's personal goals.       Expected Outcomes Short Term: Increase workloads from initial exercise prescription for resistance, speed, and METs.;Short Term: Perform resistance training exercises routinely during rehab and add in resistance training at home;Long Term: Improve cardiorespiratory fitness, muscular endurance and strength as measured by increased METs and functional capacity ( )       Able to understand and use rate of perceived exertion (RPE) scale Yes       Intervention Provide education and explanation on how to use RPE scale       Expected Outcomes Short Term: Able to use RPE daily in rehab to express subjective intensity level;Long Term:  Able to use RPE to guide intensity level when exercising independently       Able to understand and use Dyspnea scale Yes       Intervention Provide education and explanation on how to use Dyspnea scale  Expected Outcomes Short Term: Able to use Dyspnea scale daily in rehab to express subjective sense of shortness of breath during exertion;Long Term: Able to use Dyspnea scale to guide intensity level when exercising independently       Knowledge and understanding of Target Heart Rate Range (THRR) Yes       Intervention Provide education and explanation of THRR including how the numbers were predicted and where they are located for reference       Expected Outcomes Short Term: Able to state/look up THRR;Long Term: Able to use THRR to govern intensity when exercising independently;Short Term: Able to use daily as guideline for intensity in rehab       Able to check pulse independently Yes       Intervention Provide education and demonstration on how to check pulse in carotid and radial arteries.;Review the importance of being able to check your  own pulse for safety during independent exercise       Expected Outcomes Short Term: Able to explain why pulse checking is important during independent exercise;Long Term: Able to check pulse independently and accurately       Understanding of Exercise Prescription Yes       Intervention Provide education, explanation, and written materials on patient's individual exercise prescription       Expected Outcomes Short Term: Able to explain program exercise prescription;Long Term: Able to explain home exercise prescription to exercise independently          Copy of goals given to participant.

## 2024-03-03 NOTE — Progress Notes (Signed)
 Cardiac Individual Treatment Plan  Patient Details  Name: Cheyenne Lopez MRN: [id number] Date of Birth: February 03, 1951 Referring Provider:   Flowsheet Row Cardiac Rehab from 03/03/2024 in [facility] Cardiac and Pulmonary Rehab  Referring Provider Dr. Cara Lovelace    Initial Encounter Date:  Flowsheet Row Cardiac Rehab from 03/03/2024 in [facility] Cardiac and Pulmonary Rehab  Date 03/03/24    Visit Diagnosis: Status post coronary artery stent placement  Patient's Home Medications on Admission:  Current Outpatient Medications:    aspirin  81 MG chewable tablet, Chew 1 tablet (81 mg total) by mouth daily., Disp: 30 tablet, Rfl: 0   Calcium -Magnesium-Vitamin D (CALCIUM  MAGNESIUM PO), Take 1 tablet by mouth daily at 6 (six) AM., Disp: , Rfl:    clopidogrel (PLAVIX) 75 MG tablet, Take 75 mg by mouth., Disp: , Rfl:    estradiol (ESTRACE) 0.01 % CREA vaginal cream, Place 1 g vaginally., Disp: , Rfl:    Glucagon (GVOKE HYPOPEN 2-PACK) 1 MG/0.2ML SOAJ, Inject 1 mg into the skin as needed (hypoglycemia)., Disp: , Rfl:    ibuprofen (ADVIL) 200 MG tablet, Take 400 mg by mouth every 6 (six) hours as needed., Disp: , Rfl:    levothyroxine (SYNTHROID) 75 MCG tablet, Take 75 mcg by mouth daily before breakfast., Disp: , Rfl:    liothyronine (CYTOMEL) 5 MCG tablet, Take 5 mcg by mouth daily before breakfast., Disp: , Rfl:    Multiple Vitamin (MULTIVITAMIN) capsule, Take 1 capsule by mouth daily., Disp: , Rfl:    nitroGLYCERIN  (NITROSTAT ) 0.4 MG SL tablet, Place 1 tablet (0.4 mg total) under the tongue every 5 (five) minutes as needed for chest pain., Disp: 9 tablet, Rfl: 12   Travoprost, BAK Free, (TRAVATAN) 0.004 % SOLN ophthalmic solution, Place 1 drop into both eyes at bedtime., Disp: , Rfl:    traZODone (DESYREL) 50 MG tablet, Take 25 mg by mouth at bedtime as needed for sleep., Disp: , Rfl:    valsartan (DIOVAN) 40 MG tablet, Take 20 mg by mouth every morning., Disp: , Rfl: 11   chlorhexidine  (PERIDEX ) 0.12 %  solution, 30 mLs by Mouth Rinse route See admin instructions. Rinse as needed for mouth sores., Disp: , Rfl:    Cobalamin Combinations (VITAMIN B12-FOLIC ACID PO), Take 1 Dose by mouth daily at 6 (six) AM., Disp: , Rfl:    Glucagon (BAQSIMI ONE PACK) 3 MG/DOSE POWD, , Disp: , Rfl:    Insulin  Disposable Pump (OMNIPOD 5 [id number] PODS GEN 5) MISC, Inject 1 Device into the skin See admin instructions., Disp: , Rfl:    Insulin  Human (INSULIN  PUMP) SOLN, Inject into the skin. Omnipod, Disp: , Rfl:    insulin  lispro (HUMALOG) 100 UNIT/ML injection, Inject into the skin See admin instructions., Disp: , Rfl:    isosorbide  mononitrate (IMDUR ) 30 MG 24 hr tablet, Take 0.5 tablets (15 mg total) by mouth daily., Disp: 15 tablet, Rfl: 1   L-THEANINE PO, Take by mouth See admin instructions. Two dropperfuls by mouth at bedtime., Disp: , Rfl:   Past Medical History: Past Medical History:  Diagnosis Date   Adenomatous colon polyp    Aortic atherosclerosis    Bilateral ocular hypertension    Carpal tunnel syndrome    Complication of anesthesia    a.) PONV   Coronary artery disease 07/15/2019   a.) LHC/PCI 07/15/2019: 100% pRCA (chronic; unable to cross wire), 40% mLAD, 85% o-pLCx (2.5 x 22 mm Resolute Onyx DES)   CVA (cerebral vascular accident) (HCC)  a.) MRI brain 10/03/2020: small chronic cortical/subcortical infarct within LEFT parietal lobe (postcentral gyrus involved); small chronic infarcts within BILATERAL cerebellar hemispheres   Diastolic dysfunction 07/15/2019   a.) TTE 07/15/2019 (setting of NSTEMI): EF 60-65%, mid and distal lateral wall HK, G1DD; b.) TTE 02/13/2021: EF >55%, triv MR/TR   DM (diabetes mellitus), type 1 (HCC)    DOE (dyspnea on exertion)    GERD (gastroesophageal reflux disease)    Glaucoma    Hypertension    Hypothyroidism    Insulin  pump in place    a.) Omnipod 5   NSTEMI (non-ST elevated myocardial infarction) (HCC) 07/14/2019   a.) troponins were trended: 23 --> 145  --> 11,794 --> 12,794 ng/L; b.) LHC/PCI 07/15/2019: 100% pRCA (chronic; unable to cross wire), 40% mLAD, 85% o-pLCx (2.5 x 22 mm Resolute Onyx DES)   Obstructive sleep apnea on CPAP    PONV (postoperative nausea and vomiting)    Refusal of blood transfusions as patient is Jehovah's Witness     Tobacco Use: Social History   Tobacco Use  Smoking Status Never  Smokeless Tobacco Never    Labs: Review Flowsheet       Latest Ref Rng & Units 07/16/2019  Labs for ITP Cardiac and Pulmonary Rehab  Cholestrol 0 - 200 mg/dL 864   LDL (calc) 0 - 99 mg/dL 79   HDL-C >59 mg/dL 45   Trlycerides <849 mg/dL 55   Hemoglobin J8r 4.8 - 5.6 % 6.9      Exercise Target Goals: Exercise Program Goal: Individual exercise prescription set using results from initial 6 min walk test and THRR while considering  patient's activity barriers and safety.   Exercise Prescription Goal: Initial exercise prescription builds to 30-45 minutes a day of aerobic activity, 2-3 days per week.  Home exercise guidelines will be given to patient during program as part of exercise prescription that the participant will acknowledge.   Education: Aerobic Exercise: - Group verbal and visual presentation on the components of exercise prescription. Introduces F.I.T.T principle from ACSM for exercise prescriptions.  Reviews F.I.T.T. principles of aerobic exercise including progression. Written material provided at class time.   Education: Resistance Exercise: - Group verbal and visual presentation on the components of exercise prescription. Introduces F.I.T.T principle from ACSM for exercise prescriptions  Reviews F.I.T.T. principles of resistance exercise including progression. Written material provided at class time.    Education: Exercise & Equipment Safety: - Individual verbal instruction and demonstration of equipment use and safety with use of the equipment. Flowsheet Row Cardiac Rehab from 03/03/2024 in [facility] Cardiac and  Pulmonary Rehab  Date 03/03/24  Educator [facility]  Instruction Review Code 1- Verbalizes Understanding    Education: Exercise Physiology & General Exercise Guidelines: - Group verbal and written instruction with models to review the exercise physiology of the cardiovascular system and associated critical values. Provides general exercise guidelines with specific guidelines to those with heart or lung disease. Written material provided at class time.   Education: Flexibility, Balance, Mind/Body Relaxation: - Group verbal and visual presentation with interactive activity on the components of exercise prescription. Introduces F.I.T.T principle from ACSM for exercise prescriptions. Reviews F.I.T.T. principles of flexibility and balance exercise training including progression. Also discusses the mind body connection.  Reviews various relaxation techniques to help reduce and manage stress (i.e. Deep breathing, progressive muscle relaxation, and visualization). Balance handout provided to take home. Written material provided at class time.   Activity Barriers & Risk Stratification:  Activity Barriers & Cardiac Risk  Stratification - 03/03/24 1529       Activity Barriers & Cardiac Risk Stratification   Activity Barriers Joint Problems;Shortness of Breath   Left shoulder   Cardiac Risk Stratification High          6 Minute Walk:  6 Minute Walk     Row Name 03/03/24 1527         6 Minute Walk   Phase Initial     Distance 1435 feet     Walk Time 6 minutes     # of Rest Breaks 0     MPH 2.7     METS 2.8     RPE 7     Perceived Dyspnea  0     VO2 Peak 9.77     Symptoms No     Resting HR 57 bpm     Resting BP 124/62     Resting Oxygen Saturation  99 %     Exercise Oxygen Saturation  during 6 min walk 96 %     Max Ex. HR 80 bpm     Max Ex. BP 126/58     2 Minute Post BP 116/62        Oxygen Initial Assessment:   Oxygen Re-Evaluation:   Oxygen Discharge (Final Oxygen  Re-Evaluation):   Initial Exercise Prescription:  Initial Exercise Prescription - 03/03/24 1500       Date of Initial Exercise RX and Referring Provider   Date 03/03/24    Referring Provider Dr. Cara Lovelace      Oxygen   Maintain Oxygen Saturation 88% or higher      Treadmill   MPH 2.7    Grade 0    Minutes 15    METs 2.8      Recumbant Bike   Level 3    RPM 50    Watts 25    Minutes 15    METs 2.8      NuStep   Level 3    SPM 80    Minutes 15    METs 2.8      Elliptical   Level 1    Speed 3    Minutes 15    METs 2.8      Prescription Details   Duration Progress to 30 minutes of continuous aerobic without signs/symptoms of physical distress      Intensity   THRR 40-80% of Max Heartrate 93-129    Ratings of Perceived Exertion 11-13    Perceived Dyspnea 0-4      Progression   Progression Continue to progress workloads to maintain intensity without signs/symptoms of physical distress.      Resistance Training   Training Prescription Yes    Weight 3lb    Reps 10-15          Perform Capillary Blood Glucose checks as needed.  Exercise Prescription Changes:   Exercise Prescription Changes     Row Name 03/03/24 1500             Response to Exercise   Blood Pressure (Admit) 124/62       Blood Pressure (Exercise) 126/58       Blood Pressure (Exit) 116/62       Heart Rate (Admit) 57 bpm       Heart Rate (Exercise) 80 bpm       Heart Rate (Exit) 58 bpm       Oxygen Saturation (Admit) 99 %       Oxygen Saturation (  Exercise) 96 %       Oxygen Saturation (Exit) 96 %       Rating of Perceived Exertion (Exercise) 7       Perceived Dyspnea (Exercise) 0       Symptoms none       Comments results          Exercise Comments:   Exercise Goals and Review:   Exercise Goals     Row Name 03/03/24 1532             Exercise Goals   Increase Physical Activity Yes       Intervention Provide advice, education, support and counseling  about physical activity/exercise needs.;Develop an individualized exercise prescription for aerobic and resistive training based on initial evaluation findings, risk stratification, comorbidities and participant's personal goals.       Expected Outcomes Short Term: Attend rehab on a regular basis to increase amount of physical activity.;Long Term: Add in home exercise to make exercise part of routine and to increase amount of physical activity.;Long Term: Exercising regularly at least 3-5 days a week.       Increase Strength and Stamina Yes       Intervention Provide advice, education, support and counseling about physical activity/exercise needs.;Develop an individualized exercise prescription for aerobic and resistive training based on initial evaluation findings, risk stratification, comorbidities and participant's personal goals.       Expected Outcomes Short Term: Increase workloads from initial exercise prescription for resistance, speed, and METs.;Short Term: Perform resistance training exercises routinely during rehab and add in resistance training at home;Long Term: Improve cardiorespiratory fitness, muscular endurance and strength as measured by increased METs and functional capacity ( )       Able to understand and use rate of perceived exertion (RPE) scale Yes       Intervention Provide education and explanation on how to use RPE scale       Expected Outcomes Short Term: Able to use RPE daily in rehab to express subjective intensity level;Long Term:  Able to use RPE to guide intensity level when exercising independently       Able to understand and use Dyspnea scale Yes       Intervention Provide education and explanation on how to use Dyspnea scale       Expected Outcomes Short Term: Able to use Dyspnea scale daily in rehab to express subjective sense of shortness of breath during exertion;Long Term: Able to use Dyspnea scale to guide intensity level when exercising independently        Knowledge and understanding of Target Heart Rate Range (THRR) Yes       Intervention Provide education and explanation of THRR including how the numbers were predicted and where they are located for reference       Expected Outcomes Short Term: Able to state/look up THRR;Long Term: Able to use THRR to govern intensity when exercising independently;Short Term: Able to use daily as guideline for intensity in rehab       Able to check pulse independently Yes       Intervention Provide education and demonstration on how to check pulse in carotid and radial arteries.;Review the importance of being able to check your own pulse for safety during independent exercise       Expected Outcomes Short Term: Able to explain why pulse checking is important during independent exercise;Long Term: Able to check pulse independently and accurately       Understanding of Exercise  Prescription Yes       Intervention Provide education, explanation, and written materials on patient's individual exercise prescription       Expected Outcomes Short Term: Able to explain program exercise prescription;Long Term: Able to explain home exercise prescription to exercise independently          Exercise Goals Re-Evaluation :   Discharge Exercise Prescription (Final Exercise Prescription Changes):  Exercise Prescription Changes - 03/03/24 1500       Response to Exercise   Blood Pressure (Admit) 124/62    Blood Pressure (Exercise) 126/58    Blood Pressure (Exit) 116/62    Heart Rate (Admit) 57 bpm    Heart Rate (Exercise) 80 bpm    Heart Rate (Exit) 58 bpm    Oxygen Saturation (Admit) 99 %    Oxygen Saturation (Exercise) 96 %    Oxygen Saturation (Exit) 96 %    Rating of Perceived Exertion (Exercise) 7    Perceived Dyspnea (Exercise) 0    Symptoms none    Comments results          Nutrition:  Target Goals: Understanding of nutrition guidelines, daily intake of sodium 1500mg , cholesterol 200mg , calories 30%  from fat and 7% or less from saturated fats, daily to have 5 or more servings of fruits and vegetables.  Education: Nutrition 1 -Group instruction provided by verbal, written material, interactive activities, discussions, models, and posters to present general guidelines for heart healthy nutrition including macronutrients, label reading, and promoting whole foods over processed counterparts. Education serves as pensions consultant of discussion of heart healthy eating for all. Written material provided at class time.    Education: Nutrition 2 -Group instruction provided by verbal, written material, interactive activities, discussions, models, and posters to present general guidelines for heart healthy nutrition including sodium, cholesterol, and saturated fat. Providing guidance of habit forming to improve blood pressure, cholesterol, and body weight. Written material provided at class time.     Biometrics:  Pre Biometrics - 03/03/24 1532       Pre Biometrics   Height 5' 0.5 (1.537 m)    Weight 129 lb 14.4 oz (58.9 kg)    Waist Circumference 31 inches    Hip Circumference 38 inches    Waist to Hip Ratio 0.82 %    BMI (Calculated) 24.94    Single Leg Stand 28.81 seconds           Nutrition Therapy Plan and Nutrition Goals:   Nutrition Assessments:  MEDIFICTS Score Key: >=70 Need to make dietary changes  40-70 Heart Healthy Diet <= 40 Therapeutic Level Cholesterol Diet   Picture Your Plate Scores: <59 Unhealthy dietary pattern with much room for improvement. 41-50 Dietary pattern unlikely to meet recommendations for good health and room for improvement. 51-60 More healthful dietary pattern, with some room for improvement.  >60 Healthy dietary pattern, although there may be some specific behaviors that could be improved.    Nutrition Goals Re-Evaluation:   Nutrition Goals Discharge (Final Nutrition Goals Re-Evaluation):   Psychosocial: Target Goals: Acknowledge presence  or absence of significant depression and/or stress, maximize coping skills, provide positive support system. Participant is able to verbalize types and ability to use techniques and skills needed for reducing stress and depression.   Education: Stress, Anxiety, and Depression - Group verbal and visual presentation to define topics covered.  Reviews how body is impacted by stress, anxiety, and depression.  Also discusses healthy ways to reduce stress and to treat/manage anxiety and depression. Written  material provided at class time.   Education: Sleep Hygiene -Provides group verbal and written instruction about how sleep can affect your health.  Define sleep hygiene, discuss sleep cycles and impact of sleep habits. Review good sleep hygiene tips.   Initial Review & Psychosocial Screening:  Initial Psych Review & Screening - 03/03/24 1525       Initial Review   Current issues with Current Sleep Concerns      Family Dynamics   Good Support System? Yes      Barriers   Psychosocial barriers to participate in program There are no identifiable barriers or psychosocial needs.      Screening Interventions   Interventions Encouraged to exercise;Provide feedback about the scores to participant;To provide support and resources with identified psychosocial needs    Expected Outcomes Short Term goal: Utilizing psychosocial counselor, staff and physician to assist with identification of specific Stressors or current issues interfering with healing process. Setting desired goal for each stressor or current issue identified.;Short Term goal: Identification and review with participant of any Quality of Life or Depression concerns found by scoring the questionnaire.;Long Term Goal: Stressors or current issues are controlled or eliminated.;Long Term goal: The participant improves quality of Life and PHQ9 Scores as seen by post scores and/or verbalization of changes          Quality of Life Scores:    Scores of 19 and below usually indicate a poorer quality of life in these areas.  A difference of  2-3 points is a clinically meaningful difference.  A difference of 2-3 points in the total score of the Quality of Life Index has been associated with significant improvement in overall quality of life, self-image, physical symptoms, and general health in studies assessing change in quality of life.  PHQ-9: Review Flowsheet       03/03/2024 08/14/2019 07/30/2019  Depression screen PHQ 2/9  Decreased Interest 0 0 0  Down, Depressed, Hopeless 0 0 0  PHQ - 2 Score 0 0 0  Altered sleeping 1 0 1  Tired, decreased energy 0 0 3  Change in appetite 0 0 0  Feeling bad or failure about yourself  0 0 0  Trouble concentrating 1 0 1  Moving slowly or fidgety/restless 0 0 0  Suicidal thoughts 0 0 0  PHQ-9 Score 2 0  5   Difficult doing work/chores Not difficult at all - Somewhat difficult    Details       Data saved with a previous flowsheet row definition        Interpretation of Total Score  Total Score Depression Severity:  1-4 = Minimal depression, 5-9 = Mild depression, 10-14 = Moderate depression, 15-19 = Moderately severe depression, 20-27 = Severe depression   Psychosocial Evaluation and Intervention:  Psychosocial Evaluation - 03/03/24 1550       Psychosocial Evaluation & Interventions   Interventions Relaxation education;Encouraged to exercise with the program and follow exercise prescription    Comments Zalea is coming to cardiac rehab after a stent placement. She states she does not have any stress concerns at this time. She has had sleep issues for a while which Trazodone usually helps with, but with all her new medication she has been hesitant to take it. Encouraged to talk to provider or pharmacist about restarting her sleep medicine and other supplements she wants to restart. She really enjoys caring for her plants and spending time with her grandkids doing crafts. She states she  has a good  support system and is looking forward to starting in the program    Expected Outcomes Short: attend cardiac rehab for education and exercise Long: develop and maintain positive self care habits    Continue Psychosocial Services  Follow up required by staff          Psychosocial Re-Evaluation:   Psychosocial Discharge (Final Psychosocial Re-Evaluation):   Vocational Rehabilitation: Provide vocational rehab assistance to qualifying candidates.   Vocational Rehab Evaluation & Intervention:  Vocational Rehab - 03/03/24 1351       Initial Vocational Rehab Evaluation & Intervention   Assessment shows need for Vocational Rehabilitation No          Education: Education Goals: Education classes will be provided on a variety of topics geared toward better understanding of heart health and risk factor modification. Participant will state understanding/return demonstration of topics presented as noted by education test scores.  Learning Barriers/Preferences:  Learning Barriers/Preferences - 03/03/24 1351       Learning Barriers/Preferences   Learning Barriers None    Learning Preferences None          General Cardiac Education Topics:  AED/CPR: - Group verbal and written instruction with the use of models to demonstrate the basic use of the AED with the basic ABC's of resuscitation.   Test and Procedures: - Group verbal and visual presentation and models provide information about basic cardiac anatomy and function. Reviews the testing methods done to diagnose heart disease and the outcomes of the test results. Describes the treatment choices: Medical Management, Angioplasty, or Coronary Bypass Surgery for treating various heart conditions including Myocardial Infarction, Angina, Valve Disease, and Cardiac Arrhythmias. Written material provided at class time.   Medication Safety: - Group verbal and visual instruction to review commonly prescribed medications for  heart and lung disease. Reviews the medication, class of the drug, and side effects. Includes the steps to properly store meds and maintain the prescription regimen. Written material provided at class time.   Intimacy: - Group verbal instruction through game format to discuss how heart and lung disease can affect sexual intimacy. Written material provided at class time.   Know Your Numbers and Heart Failure: - Group verbal and visual instruction to discuss disease risk factors for cardiac and pulmonary disease and treatment options.  Reviews associated critical values for Overweight/Obesity, Hypertension, Cholesterol, and Diabetes.  Discusses basics of heart failure: signs/symptoms and treatments.  Introduces Heart Failure Zone chart for action plan for heart failure. Written material provided at class time.   Infection Prevention: - Provides verbal and written material to individual with discussion of infection control including proper hand washing and proper equipment cleaning during exercise session. Flowsheet Row Cardiac Rehab from 03/03/2024 in [facility] Cardiac and Pulmonary Rehab  Date 03/03/24  Educator [facility]  Instruction Review Code 1- Verbalizes Understanding    Falls Prevention: - Provides verbal and written material to individual with discussion of falls prevention and safety. Flowsheet Row Cardiac Rehab from 03/03/2024 in [facility] Cardiac and Pulmonary Rehab  Date 03/03/24  Educator [facility]  Instruction Review Code 1- Verbalizes Understanding    Other: -Provides group and verbal instruction on various topics (see comments)   Knowledge Questionnaire Score:   Core Components/Risk Factors/Patient Goals at Admission:  Personal Goals and Risk Factors at Admission - 03/03/24 1346       Core Components/Risk Factors/Patient Goals on Admission    Weight Management Yes;Weight Loss    Goal Weight: Long Term 120 lb (54.4 kg)  Expected Outcomes Short Term: Continue to assess and modify  interventions until short term weight is achieved;Weight Loss: Understanding of general recommendations for a balanced deficit meal plan, which promotes 1-2 lb weight loss per week and includes a negative energy balance of [phone] kcal/d;Understanding recommendations for meals to include 15-35% energy as protein, 25-35% energy from fat, 35-60% energy from carbohydrates, less than 200mg  of dietary cholesterol, 20-35 gm of total fiber daily;Understanding of distribution of calorie intake throughout the day with the consumption of 4-5 meals/snacks    Diabetes Yes    Intervention Provide education about signs/symptoms and action to take for hypo/hyperglycemia.;Provide education about proper nutrition, including hydration, and aerobic/resistive exercise prescription along with prescribed medications to achieve blood glucose in normal ranges: Fasting glucose 65-99 mg/dL    Expected Outcomes Short Term: Participant verbalizes understanding of the signs/symptoms and immediate care of hyper/hypoglycemia, proper foot care and importance of medication, aerobic/resistive exercise and nutrition plan for blood glucose control.;Long Term: Attainment of HbA1C < 7%.    Hypertension Yes    Intervention Provide education on lifestyle modifcations including regular physical activity/exercise, weight management, moderate sodium restriction and increased consumption of fresh fruit, vegetables, and low fat dairy, alcohol moderation, and smoking cessation.;Monitor prescription use compliance.    Expected Outcomes Short Term: Continued assessment and intervention until BP is < 140/22mm HG in hypertensive participants. < 130/45mm HG in hypertensive participants with diabetes, heart failure or chronic kidney disease.;Long Term: Maintenance of blood pressure at goal levels.    Lipids Yes    Intervention Provide education and support for participant on nutrition & aerobic/resistive exercise along with prescribed medications to achieve  LDL 70mg , HDL >40mg .    Expected Outcomes Short Term: Participant states understanding of desired cholesterol values and is compliant with medications prescribed. Participant is following exercise prescription and nutrition guidelines.;Long Term: Cholesterol controlled with medications as prescribed, with individualized exercise RX and with personalized nutrition plan. Value goals: LDL < 70mg , HDL > 40 mg.          Education:Diabetes - Individual verbal and written instruction to review signs/symptoms of diabetes, desired ranges of glucose level fasting, after meals and with exercise. Acknowledge that pre and post exercise glucose checks will be done for 3 sessions at entry of program. Flowsheet Row Cardiac Rehab from 03/03/2024 in [facility]  Date 03/03/24  Educator [facility]  Instruction Review Code 1- Verbalizes Understanding    Core Components/Risk Factors/Patient Goals Review:   Goals and Risk Factor Review     Row Name 03/03/24 1544             Core Components/Risk Factors/Patient Goals Review   Review --          Core Components/Risk Factors/Patient Goals at Discharge (Final Review):   Goals and Risk Factor Review - 03/03/24 1544       Core Components/Risk Factors/Patient Goals Review   Review --          ITP Comments:  ITP Comments     Row Name 03/03/24 1511           ITP Comments Completed program orientation and . Initial ITP created and sent for review to Medical Director.          Comments: Initial ITP

## 2024-03-11 DIAGNOSIS — Z955 Presence of coronary angioplasty implant and graft: Secondary | ICD-10-CM

## 2024-03-11 NOTE — Progress Notes (Signed)
 Cardiac Individual Treatment Plan  Patient Details  Name: Cheyenne Lopez MRN: [id number] Date of Birth: 14-Jul-1950 Referring Provider:   Flowsheet Row Cardiac Rehab from 03/03/2024 in [facility] Cardiac and Pulmonary Rehab  Referring Provider Dr. Cara Lovelace    Initial Encounter Date:  Flowsheet Row Cardiac Rehab from 03/03/2024 in [facility] Cardiac and Pulmonary Rehab  Date 03/03/24    Visit Diagnosis: Status post coronary artery stent placement  Patient's Home Medications on Admission: Current Medications[1]  Past Medical History: Past Medical History:  Diagnosis Date   Adenomatous colon polyp    Aortic atherosclerosis    Bilateral ocular hypertension    Carpal tunnel syndrome    Complication of anesthesia    a.) PONV   Coronary artery disease 07/15/2019   a.) LHC/PCI 07/15/2019: 100% pRCA (chronic; unable to cross wire), 40% mLAD, 85% o-pLCx (2.5 x 22 mm Resolute Onyx DES)   CVA (cerebral vascular accident) (HCC)    a.) MRI brain 10/03/2020: small chronic cortical/subcortical infarct within LEFT parietal lobe (postcentral gyrus involved); small chronic infarcts within BILATERAL cerebellar hemispheres   Diastolic dysfunction 07/15/2019   a.) TTE 07/15/2019 (setting of NSTEMI): EF 60-65%, mid and distal lateral wall HK, G1DD; b.) TTE 02/13/2021: EF >55%, triv MR/TR   DM (diabetes mellitus), type 1 (HCC)    DOE (dyspnea on exertion)    GERD (gastroesophageal reflux disease)    Glaucoma    Hypertension    Hypothyroidism    Insulin  pump in place    a.) Omnipod 5   NSTEMI (non-ST elevated myocardial infarction) (HCC) 07/14/2019   a.) troponins were trended: 23 --> 145 --> 11,794 --> 12,794 ng/L; b.) LHC/PCI 07/15/2019: 100% pRCA (chronic; unable to cross wire), 40% mLAD, 85% o-pLCx (2.5 x 22 mm Resolute Onyx DES)   Obstructive sleep apnea on CPAP    PONV (postoperative nausea and vomiting)    Refusal of blood transfusions as patient is Jehovah's Witness     Tobacco  Use: Tobacco Use History[2]  Labs: Review Flowsheet       Latest Ref Rng & Units 07/16/2019  Labs for ITP Cardiac and Pulmonary Rehab  Cholestrol 0 - 200 mg/dL 864   LDL (calc) 0 - 99 mg/dL 79   HDL-C >59 mg/dL 45   Trlycerides <849 mg/dL 55   Hemoglobin J8r 4.8 - 5.6 % 6.9      Exercise Target Goals: Exercise Program Goal: Individual exercise prescription set using results from initial 6 min walk test and THRR while considering  patients activity barriers and safety.   Exercise Prescription Goal: Initial exercise prescription builds to 30-45 minutes a day of aerobic activity, 2-3 days per week.  Home exercise guidelines will be given to patient during program as part of exercise prescription that the participant will acknowledge.   Education: Aerobic Exercise: - Group verbal and visual presentation on the components of exercise prescription. Introduces F.I.T.T principle from ACSM for exercise prescriptions.  Reviews F.I.T.T. principles of aerobic exercise including progression. Written material provided at class time.   Education: Resistance Exercise: - Group verbal and visual presentation on the components of exercise prescription. Introduces F.I.T.T principle from ACSM for exercise prescriptions  Reviews F.I.T.T. principles of resistance exercise including progression. Written material provided at class time.    Education: Exercise & Equipment Safety: - Individual verbal instruction and demonstration of equipment use and safety with use of the equipment. Flowsheet Row Cardiac Rehab from 03/03/2024 in [facility] Cardiac and Pulmonary Rehab  Date 03/03/24  Educator [facility]  Instruction Review Code 1- Verbalizes Understanding    Education: Exercise Physiology & General Exercise Guidelines: - Group verbal and written instruction with models to review the exercise physiology of the cardiovascular system and associated critical values. Provides general exercise guidelines with specific  guidelines to those with heart or lung disease. Written material provided at class time.   Education: Flexibility, Balance, Mind/Body Relaxation: - Group verbal and visual presentation with interactive activity on the components of exercise prescription. Introduces F.I.T.T principle from ACSM for exercise prescriptions. Reviews F.I.T.T. principles of flexibility and balance exercise training including progression. Also discusses the mind body connection.  Reviews various relaxation techniques to help reduce and manage stress (i.e. Deep breathing, progressive muscle relaxation, and visualization). Balance handout provided to take home. Written material provided at class time.   Activity Barriers & Risk Stratification:  Activity Barriers & Cardiac Risk Stratification - 03/03/24 1529       Activity Barriers & Cardiac Risk Stratification   Activity Barriers Joint Problems;Shortness of Breath   Left shoulder   Cardiac Risk Stratification High          6 Minute Walk:  6 Minute Walk     Row Name 03/03/24 1527         6 Minute Walk   Phase Initial     Distance 1435 feet     Walk Time 6 minutes     # of Rest Breaks 0     MPH 2.7     METS 2.8     RPE 7     Perceived Dyspnea  0     VO2 Peak 9.77     Symptoms No     Resting HR 57 bpm     Resting BP 124/62     Resting Oxygen Saturation  99 %     Exercise Oxygen Saturation  during 6 min walk 96 %     Max Ex. HR 80 bpm     Max Ex. BP 126/58     2 Minute Post BP 116/62        Oxygen Initial Assessment:   Oxygen Re-Evaluation:   Oxygen Discharge (Final Oxygen Re-Evaluation):   Initial Exercise Prescription:  Initial Exercise Prescription - 03/03/24 1500       Date of Initial Exercise RX and Referring Provider   Date 03/03/24    Referring Provider Dr. Cara Lovelace      Oxygen   Maintain Oxygen Saturation 88% or higher      Treadmill   MPH 2.7    Grade 0    Minutes 15    METs 2.8      Recumbant Bike   Level 3     RPM 50    Watts 25    Minutes 15    METs 2.8      NuStep   Level 3    SPM 80    Minutes 15    METs 2.8      Elliptical   Level 1    Speed 3    Minutes 15    METs 2.8      Prescription Details   Duration Progress to 30 minutes of continuous aerobic without signs/symptoms of physical distress      Intensity   THRR 40-80% of Max Heartrate 93-129    Ratings of Perceived Exertion 11-13    Perceived Dyspnea 0-4      Progression   Progression Continue to progress workloads to maintain intensity without signs/symptoms of physical  distress.      Resistance Training   Training Prescription Yes    Weight 3lb    Reps 10-15          Perform Capillary Blood Glucose checks as needed.  Exercise Prescription Changes:   Exercise Prescription Changes     Row Name 03/03/24 1500             Response to Exercise   Blood Pressure (Admit) 124/62       Blood Pressure (Exercise) 126/58       Blood Pressure (Exit) 116/62       Heart Rate (Admit) 57 bpm       Heart Rate (Exercise) 80 bpm       Heart Rate (Exit) 58 bpm       Oxygen Saturation (Admit) 99 %       Oxygen Saturation (Exercise) 96 %       Oxygen Saturation (Exit) 96 %       Rating of Perceived Exertion (Exercise) 7       Perceived Dyspnea (Exercise) 0       Symptoms none       Comments results          Exercise Comments:   Exercise Goals and Review:   Exercise Goals     Row Name 03/03/24 1532             Exercise Goals   Increase Physical Activity Yes       Intervention Provide advice, education, support and counseling about physical activity/exercise needs.;Develop an individualized exercise prescription for aerobic and resistive training based on initial evaluation findings, risk stratification, comorbidities and participant's personal goals.       Expected Outcomes Short Term: Attend rehab on a regular basis to increase amount of physical activity.;Long Term: Add in home exercise to make  exercise part of routine and to increase amount of physical activity.;Long Term: Exercising regularly at least 3-5 days a week.       Increase Strength and Stamina Yes       Intervention Provide advice, education, support and counseling about physical activity/exercise needs.;Develop an individualized exercise prescription for aerobic and resistive training based on initial evaluation findings, risk stratification, comorbidities and participant's personal goals.       Expected Outcomes Short Term: Increase workloads from initial exercise prescription for resistance, speed, and METs.;Short Term: Perform resistance training exercises routinely during rehab and add in resistance training at home;Long Term: Improve cardiorespiratory fitness, muscular endurance and strength as measured by increased METs and functional capacity ( )       Able to understand and use rate of perceived exertion (RPE) scale Yes       Intervention Provide education and explanation on how to use RPE scale       Expected Outcomes Short Term: Able to use RPE daily in rehab to express subjective intensity level;Long Term:  Able to use RPE to guide intensity level when exercising independently       Able to understand and use Dyspnea scale Yes       Intervention Provide education and explanation on how to use Dyspnea scale       Expected Outcomes Short Term: Able to use Dyspnea scale daily in rehab to express subjective sense of shortness of breath during exertion;Long Term: Able to use Dyspnea scale to guide intensity level when exercising independently       Knowledge and understanding of Target Heart Rate Range (THRR) Yes  Intervention Provide education and explanation of THRR including how the numbers were predicted and where they are located for reference       Expected Outcomes Short Term: Able to state/look up THRR;Long Term: Able to use THRR to govern intensity when exercising independently;Short Term: Able to use daily  as guideline for intensity in rehab       Able to check pulse independently Yes       Intervention Provide education and demonstration on how to check pulse in carotid and radial arteries.;Review the importance of being able to check your own pulse for safety during independent exercise       Expected Outcomes Short Term: Able to explain why pulse checking is important during independent exercise;Long Term: Able to check pulse independently and accurately       Understanding of Exercise Prescription Yes       Intervention Provide education, explanation, and written materials on patient's individual exercise prescription       Expected Outcomes Short Term: Able to explain program exercise prescription;Long Term: Able to explain home exercise prescription to exercise independently          Exercise Goals Re-Evaluation :   Discharge Exercise Prescription (Final Exercise Prescription Changes):  Exercise Prescription Changes - 03/03/24 1500       Response to Exercise   Blood Pressure (Admit) 124/62    Blood Pressure (Exercise) 126/58    Blood Pressure (Exit) 116/62    Heart Rate (Admit) 57 bpm    Heart Rate (Exercise) 80 bpm    Heart Rate (Exit) 58 bpm    Oxygen Saturation (Admit) 99 %    Oxygen Saturation (Exercise) 96 %    Oxygen Saturation (Exit) 96 %    Rating of Perceived Exertion (Exercise) 7    Perceived Dyspnea (Exercise) 0    Symptoms none    Comments results          Nutrition:  Target Goals: Understanding of nutrition guidelines, daily intake of sodium 1500mg , cholesterol 200mg , calories 30% from fat and 7% or less from saturated fats, daily to have 5 or more servings of fruits and vegetables.  Education: Nutrition 1 -Group instruction provided by verbal, written material, interactive activities, discussions, models, and posters to present general guidelines for heart healthy nutrition including macronutrients, label reading, and promoting whole foods over  processed counterparts. Education serves as pensions consultant of discussion of heart healthy eating for all. Written material provided at class time.    Education: Nutrition 2 -Group instruction provided by verbal, written material, interactive activities, discussions, models, and posters to present general guidelines for heart healthy nutrition including sodium, cholesterol, and saturated fat. Providing guidance of habit forming to improve blood pressure, cholesterol, and body weight. Written material provided at class time.     Biometrics:  Pre Biometrics - 03/03/24 1532       Pre Biometrics   Height 5' 0.5 (1.537 m)    Weight 129 lb 14.4 oz (58.9 kg)    Waist Circumference 31 inches    Hip Circumference 38 inches    Waist to Hip Ratio 0.82 %    BMI (Calculated) 24.94    Single Leg Stand 28.81 seconds           Nutrition Therapy Plan and Nutrition Goals:   Nutrition Assessments:  MEDIFICTS Score Key: >=70 Need to make dietary changes  40-70 Heart Healthy Diet <= 40 Therapeutic Level Cholesterol Diet   Picture Your Plate Scores: <59 Unhealthy dietary pattern  with much room for improvement. 41-50 Dietary pattern unlikely to meet recommendations for good health and room for improvement. 51-60 More healthful dietary pattern, with some room for improvement.  >60 Healthy dietary pattern, although there may be some specific behaviors that could be improved.    Nutrition Goals Re-Evaluation:   Nutrition Goals Discharge (Final Nutrition Goals Re-Evaluation):   Psychosocial: Target Goals: Acknowledge presence or absence of significant depression and/or stress, maximize coping skills, provide positive support system. Participant is able to verbalize types and ability to use techniques and skills needed for reducing stress and depression.   Education: Stress, Anxiety, and Depression - Group verbal and visual presentation to define topics covered.  Reviews how body is impacted  by stress, anxiety, and depression.  Also discusses healthy ways to reduce stress and to treat/manage anxiety and depression. Written material provided at class time.   Education: Sleep Hygiene -Provides group verbal and written instruction about how sleep can affect your health.  Define sleep hygiene, discuss sleep cycles and impact of sleep habits. Review good sleep hygiene tips.   Initial Review & Psychosocial Screening:  Initial Psych Review & Screening - 03/03/24 1525       Initial Review   Current issues with Current Sleep Concerns      Family Dynamics   Good Support System? Yes      Barriers   Psychosocial barriers to participate in program There are no identifiable barriers or psychosocial needs.      Screening Interventions   Interventions Encouraged to exercise;Provide feedback about the scores to participant;To provide support and resources with identified psychosocial needs    Expected Outcomes Short Term goal: Utilizing psychosocial counselor, staff and physician to assist with identification of specific Stressors or current issues interfering with healing process. Setting desired goal for each stressor or current issue identified.;Short Term goal: Identification and review with participant of any Quality of Life or Depression concerns found by scoring the questionnaire.;Long Term Goal: Stressors or current issues are controlled or eliminated.;Long Term goal: The participant improves quality of Life and PHQ9 Scores as seen by post scores and/or verbalization of changes          Quality of Life Scores:   Quality of Life - 03/04/24 0730       Quality of Life   Select Quality of Life      Quality of Life Scores   Health/Function Pre 26.63 %    Socioeconomic Pre 28.13 %    Psych/Spiritual Pre 29.14 %    Family Pre 30 %    GLOBAL Pre 27.96 %         Scores of 19 and below usually indicate a poorer quality of life in these areas.  A difference of  2-3 points is a  clinically meaningful difference.  A difference of 2-3 points in the total score of the Quality of Life Index has been associated with significant improvement in overall quality of life, self-image, physical symptoms, and general health in studies assessing change in quality of life.  PHQ-9: Review Flowsheet       03/03/2024 08/14/2019 07/30/2019  Depression screen PHQ 2/9  Decreased Interest 0 0 0  Down, Depressed, Hopeless 0 0 0  PHQ - 2 Score 0 0 0  Altered sleeping 1 0 1  Tired, decreased energy 0 0 3  Change in appetite 0 0 0  Feeling bad or failure about yourself  0 0 0  Trouble concentrating 1 0 1  Moving  slowly or fidgety/restless 0 0 0  Suicidal thoughts 0 0 0  PHQ-9 Score 2 0  5   Difficult doing work/chores Not difficult at all - Somewhat difficult    Details       Data saved with a previous flowsheet row definition        Interpretation of Total Score  Total Score Depression Severity:  1-4 = Minimal depression, 5-9 = Mild depression, 10-14 = Moderate depression, 15-19 = Moderately severe depression, 20-27 = Severe depression   Psychosocial Evaluation and Intervention:  Psychosocial Evaluation - 03/03/24 1550       Psychosocial Evaluation & Interventions   Interventions Relaxation education;Encouraged to exercise with the program and follow exercise prescription    Comments Vertis is coming to cardiac rehab after a stent placement. She states she does not have any stress concerns at this time. She has had sleep issues for a while which Trazodone usually helps with, but with all her new medication she has been hesitant to take it. Encouraged to talk to provider or pharmacist about restarting her sleep medicine and other supplements she wants to restart. She really enjoys caring for her plants and spending time with her grandkids doing crafts. She states she has a good support system and is looking forward to starting in the program    Expected Outcomes Short: attend  cardiac rehab for education and exercise Long: develop and maintain positive self care habits    Continue Psychosocial Services  Follow up required by staff          Psychosocial Re-Evaluation:   Psychosocial Discharge (Final Psychosocial Re-Evaluation):   Vocational Rehabilitation: Provide vocational rehab assistance to qualifying candidates.   Vocational Rehab Evaluation & Intervention:  Vocational Rehab - 03/03/24 1351       Initial Vocational Rehab Evaluation & Intervention   Assessment shows need for Vocational Rehabilitation No          Education: Education Goals: Education classes will be provided on a variety of topics geared toward better understanding of heart health and risk factor modification. Participant will state understanding/return demonstration of topics presented as noted by education test scores.  Learning Barriers/Preferences:  Learning Barriers/Preferences - 03/03/24 1351       Learning Barriers/Preferences   Learning Barriers None    Learning Preferences None          General Cardiac Education Topics:  AED/CPR: - Group verbal and written instruction with the use of models to demonstrate the basic use of the AED with the basic ABC's of resuscitation.   Test and Procedures: - Group verbal and visual presentation and models provide information about basic cardiac anatomy and function. Reviews the testing methods done to diagnose heart disease and the outcomes of the test results. Describes the treatment choices: Medical Management, Angioplasty, or Coronary Bypass Surgery for treating various heart conditions including Myocardial Infarction, Angina, Valve Disease, and Cardiac Arrhythmias. Written material provided at class time.   Medication Safety: - Group verbal and visual instruction to review commonly prescribed medications for heart and lung disease. Reviews the medication, class of the drug, and side effects. Includes the steps to  properly store meds and maintain the prescription regimen. Written material provided at class time.   Intimacy: - Group verbal instruction through game format to discuss how heart and lung disease can affect sexual intimacy. Written material provided at class time.   Know Your Numbers and Heart Failure: - Group verbal and visual instruction to discuss disease risk  factors for cardiac and pulmonary disease and treatment options.  Reviews associated critical values for Overweight/Obesity, Hypertension, Cholesterol, and Diabetes.  Discusses basics of heart failure: signs/symptoms and treatments.  Introduces Heart Failure Zone chart for action plan for heart failure. Written material provided at class time.   Infection Prevention: - Provides verbal and written material to individual with discussion of infection control including proper hand washing and proper equipment cleaning during exercise session. Flowsheet Row Cardiac Rehab from 03/03/2024 in [facility] Cardiac and Pulmonary Rehab  Date 03/03/24  Educator [facility]  Instruction Review Code 1- Verbalizes Understanding    Falls Prevention: - Provides verbal and written material to individual with discussion of falls prevention and safety. Flowsheet Row Cardiac Rehab from 03/03/2024 in [facility] Cardiac and Pulmonary Rehab  Date 03/03/24  Educator [facility]  Instruction Review Code 1- Verbalizes Understanding    Other: -Provides group and verbal instruction on various topics (see comments)   Knowledge Questionnaire Score:   Core Components/Risk Factors/Patient Goals at Admission:  Personal Goals and Risk Factors at Admission - 03/03/24 1346       Core Components/Risk Factors/Patient Goals on Admission    Weight Management Yes;Weight Loss    Goal Weight: Long Term 120 lb (54.4 kg)    Expected Outcomes Short Term: Continue to assess and modify interventions until short term weight is achieved;Weight Loss: Understanding of general recommendations for a  balanced deficit meal plan, which promotes 1-2 lb weight loss per week and includes a negative energy balance of [phone] kcal/d;Understanding recommendations for meals to include 15-35% energy as protein, 25-35% energy from fat, 35-60% energy from carbohydrates, less than 200mg  of dietary cholesterol, 20-35 gm of total fiber daily;Understanding of distribution of calorie intake throughout the day with the consumption of 4-5 meals/snacks    Diabetes Yes    Intervention Provide education about signs/symptoms and action to take for hypo/hyperglycemia.;Provide education about proper nutrition, including hydration, and aerobic/resistive exercise prescription along with prescribed medications to achieve blood glucose in normal ranges: Fasting glucose 65-99 mg/dL    Expected Outcomes Short Term: Participant verbalizes understanding of the signs/symptoms and immediate care of hyper/hypoglycemia, proper foot care and importance of medication, aerobic/resistive exercise and nutrition plan for blood glucose control.;Long Term: Attainment of HbA1C < 7%.    Hypertension Yes    Intervention Provide education on lifestyle modifcations including regular physical activity/exercise, weight management, moderate sodium restriction and increased consumption of fresh fruit, vegetables, and low fat dairy, alcohol moderation, and smoking cessation.;Monitor prescription use compliance.    Expected Outcomes Short Term: Continued assessment and intervention until BP is < 140/26mm HG in hypertensive participants. < 130/59mm HG in hypertensive participants with diabetes, heart failure or chronic kidney disease.;Long Term: Maintenance of blood pressure at goal levels.    Lipids Yes    Intervention Provide education and support for participant on nutrition & aerobic/resistive exercise along with prescribed medications to achieve LDL 70mg , HDL >40mg .    Expected Outcomes Short Term: Participant states understanding of desired  cholesterol values and is compliant with medications prescribed. Participant is following exercise prescription and nutrition guidelines.;Long Term: Cholesterol controlled with medications as prescribed, with individualized exercise RX and with personalized nutrition plan. Value goals: LDL < 70mg , HDL > 40 mg.          Education:Diabetes - Individual verbal and written instruction to review signs/symptoms of diabetes, desired ranges of glucose level fasting, after meals and with exercise. Acknowledge that pre and post exercise glucose checks will be done for  3 sessions at entry of program. Flowsheet Row Cardiac Rehab from 03/03/2024 in [facility] Cardiac and Pulmonary Rehab  Date 03/03/24  Educator [facility]  Instruction Review Code 1- Verbalizes Understanding    Core Components/Risk Factors/Patient Goals Review:   Goals and Risk Factor Review     Row Name 03/03/24 1544             Core Components/Risk Factors/Patient Goals Review   Review --          Core Components/Risk Factors/Patient Goals at Discharge (Final Review):   Goals and Risk Factor Review - 03/03/24 1544       Core Components/Risk Factors/Patient Goals Review   Review --          ITP Comments:  ITP Comments     Row Name 03/03/24 1511 03/11/24 0938         ITP Comments Completed program orientation and . Initial ITP created and sent for review to Medical Director. 30 Day review completed. Medical Director ITP review done, changes made as directed, and signed approval by Medical Director.         Comments: 30 day review    [1]  Current Outpatient Medications:    aspirin  81 MG chewable tablet, Chew 1 tablet (81 mg total) by mouth daily., Disp: 30 tablet, Rfl: 0   Calcium -Magnesium-Vitamin D (CALCIUM  MAGNESIUM PO), Take 1 tablet by mouth daily at 6 (six) AM., Disp: , Rfl:    chlorhexidine  (PERIDEX ) 0.12 % solution, 30 mLs by Mouth Rinse route See admin instructions. Rinse as needed for mouth sores., Disp: ,  Rfl:    clopidogrel (PLAVIX) 75 MG tablet, Take 75 mg by mouth., Disp: , Rfl:    Cobalamin Combinations (VITAMIN B12-FOLIC ACID PO), Take 1 Dose by mouth daily at 6 (six) AM., Disp: , Rfl:    estradiol (ESTRACE) 0.01 % CREA vaginal cream, Place 1 g vaginally., Disp: , Rfl:    Glucagon (BAQSIMI ONE PACK) 3 MG/DOSE POWD, , Disp: , Rfl:    Glucagon (GVOKE HYPOPEN 2-PACK) 1 MG/0.2ML SOAJ, Inject 1 mg into the skin as needed (hypoglycemia)., Disp: , Rfl:    ibuprofen (ADVIL) 200 MG tablet, Take 400 mg by mouth every 6 (six) hours as needed., Disp: , Rfl:    Insulin  Disposable Pump (OMNIPOD 5 [id number] PODS GEN 5) MISC, Inject 1 Device into the skin See admin instructions., Disp: , Rfl:    Insulin  Human (INSULIN  PUMP) SOLN, Inject into the skin. Omnipod, Disp: , Rfl:    insulin  lispro (HUMALOG) 100 UNIT/ML injection, Inject into the skin See admin instructions., Disp: , Rfl:    isosorbide  mononitrate (IMDUR ) 30 MG 24 hr tablet, Take 0.5 tablets (15 mg total) by mouth daily., Disp: 15 tablet, Rfl: 1   L-THEANINE PO, Take by mouth See admin instructions. Two dropperfuls by mouth at bedtime., Disp: , Rfl:    levothyroxine (SYNTHROID) 75 MCG tablet, Take 75 mcg by mouth daily before breakfast., Disp: , Rfl:    liothyronine (CYTOMEL) 5 MCG tablet, Take 5 mcg by mouth daily before breakfast., Disp: , Rfl:    Multiple Vitamin (MULTIVITAMIN) capsule, Take 1 capsule by mouth daily., Disp: , Rfl:    nitroGLYCERIN  (NITROSTAT ) 0.4 MG SL tablet, Place 1 tablet (0.4 mg total) under the tongue every 5 (five) minutes as needed for chest pain., Disp: 9 tablet, Rfl: 12   Travoprost, BAK Free, (TRAVATAN) 0.004 % SOLN ophthalmic solution, Place 1 drop into both eyes at bedtime., Disp: , Rfl:  traZODone (DESYREL) 50 MG tablet, Take 25 mg by mouth at bedtime as needed for sleep., Disp: , Rfl:    valsartan (DIOVAN) 40 MG tablet, Take 20 mg by mouth every morning., Disp: , Rfl: 11 [2]  Social History Tobacco Use  Smoking  Status Never  Smokeless Tobacco Never

## 2024-03-16 ENCOUNTER — Encounter

## 2024-03-16 DIAGNOSIS — Z955 Presence of coronary angioplasty implant and graft: Secondary | ICD-10-CM

## 2024-03-16 DIAGNOSIS — Z48812 Encounter for surgical aftercare following surgery on the circulatory system: Secondary | ICD-10-CM | POA: Diagnosis not present

## 2024-03-16 LAB — GLUCOSE, CAPILLARY
Glucose-Capillary: 254 mg/dL — ABNORMAL HIGH (ref 70–99)
Glucose-Capillary: 117 mg/dL — ABNORMAL HIGH (ref 70–99)

## 2024-03-16 NOTE — Progress Notes (Signed)
 Daily Session Note  Patient Details  Name: Cheyenne Lopez MRN: [id number] Date of Birth: 08/03/1950 Referring Provider:   Flowsheet Row Cardiac Rehab from 03/03/2024 in [facility] Cardiac and Pulmonary Rehab  Referring Provider Dr. Cara Lovelace    Encounter Date: 03/16/2024  Check In:  Session Check In - 03/16/24 1008       Check-In   Supervising physician immediately available to respond to emergencies See telemetry face sheet for immediately available ER MD    Location [facility]-Cardiac & Pulmonary Rehab    Staff Present Burnard Davenport [facility], RN, DNP, NE-BC;Laura Cates RN,BSN;Dea Bitting Dyane BS, ACSM CEP, Exercise Physiologist;Kristen Coble RN,BC,MSN    Virtual Visit No    Medication changes reported     No    Fall or balance concerns reported    No    Tobacco Cessation No Change    Warm-up and Cool-down Performed on first and last piece of equipment    Resistance Training Performed Yes    VAD Patient? No    PAD/SET Patient? No      Pain Assessment   Currently in Pain? No/denies             Tobacco Use History[1]  Goals Met:  Independence with exercise equipment Exercise tolerated well No report of concerns or symptoms today Strength training completed today  Goals Unmet:  Not Applicable  Comments: First full day of exercise!  Patient was oriented to gym and equipment including functions, settings, policies, and procedures.  Patient's individual exercise prescription and treatment plan were reviewed.  All starting workloads were established based on the results of the 6 minute walk test done at initial orientation visit.  The plan for exercise progression was also introduced and progression will be customized based on patient's performance and goals.    Dr. Oneil Pinal is Medical Director for [facility] Cardiac Rehabilitation.  Dr. Fuad Aleskerov is Medical Director for [facility] Pulmonary Rehabilitation.    [1]  Social History Tobacco Use   Smoking Status Never  Smokeless Tobacco Never

## 2024-03-18 ENCOUNTER — Encounter

## 2024-03-18 DIAGNOSIS — Z955 Presence of coronary angioplasty implant and graft: Secondary | ICD-10-CM

## 2024-03-18 DIAGNOSIS — Z48812 Encounter for surgical aftercare following surgery on the circulatory system: Secondary | ICD-10-CM | POA: Diagnosis not present

## 2024-03-18 LAB — GLUCOSE, CAPILLARY
Glucose-Capillary: 279 mg/dL — ABNORMAL HIGH (ref 70–99)
Glucose-Capillary: 103 mg/dL — ABNORMAL HIGH (ref 70–99)

## 2024-03-18 NOTE — Progress Notes (Signed)
 Daily Session Note  Patient Details  Name: Cheyenne Lopez MRN: [id number] Date of Birth: 1950-10-08 Referring Provider:   Flowsheet Row Cardiac Rehab from 03/03/2024 in [facility] Cardiac and Pulmonary Rehab  Referring Provider Dr. Cara Lovelace    Encounter Date: 03/18/2024  Check In:  Session Check In - 03/18/24 0926       Check-In   Supervising physician immediately available to respond to emergencies See telemetry face sheet for immediately available ER MD    Location [facility]-Cardiac & Pulmonary Rehab    Staff Present Burnard Davenport RN,BSN,MPA;Joseph [facility] RCP,RRT,BSRT;Laura Cates RN,BSN;Noah Tickle, [place], Exercise Physiologist    Virtual Visit No    Medication changes reported     No    Fall or balance concerns reported    No    Tobacco Cessation No Change    Warm-up and Cool-down Performed on first and last piece of equipment    Resistance Training Performed Yes    VAD Patient? No    PAD/SET Patient? No      Pain Assessment   Currently in Pain? No/denies             Tobacco Use History[1]  Goals Met:  Independence with exercise equipment Exercise tolerated well No report of concerns or symptoms today Strength training completed today  Goals Unmet:  Not Applicable  Comments: Pt able to follow exercise prescription today without complaint.  Will continue to monitor for progression.    Dr. Oneil Pinal is Medical Director for [facility] Cardiac Rehabilitation.  Dr. Fuad Aleskerov is Medical Director for [facility] Pulmonary Rehabilitation.    [1]  Social History Tobacco Use  Smoking Status Never  Smokeless Tobacco Never

## 2024-03-23 ENCOUNTER — Other Ambulatory Visit: Payer: Self-pay | Admitting: Family Medicine

## 2024-03-23 ENCOUNTER — Encounter

## 2024-03-23 DIAGNOSIS — Z1231 Encounter for screening mammogram for malignant neoplasm of breast: Secondary | ICD-10-CM

## 2024-03-23 DIAGNOSIS — Z955 Presence of coronary angioplasty implant and graft: Secondary | ICD-10-CM

## 2024-03-23 DIAGNOSIS — Z48812 Encounter for surgical aftercare following surgery on the circulatory system: Secondary | ICD-10-CM | POA: Diagnosis not present

## 2024-03-23 LAB — GLUCOSE, CAPILLARY
Glucose-Capillary: 144 mg/dL — ABNORMAL HIGH (ref 70–99)
Glucose-Capillary: 87 mg/dL (ref 70–99)

## 2024-03-23 NOTE — Progress Notes (Signed)
 Daily Session Note  Patient Details  Name: Cheyenne Lopez MRN: [id number] Date of Birth: [birth year] Referring Provider:   Flowsheet Row Cardiac Rehab from 03/03/2024 in [facility] Cardiac and Pulmonary Rehab  Referring Provider Dr. Cara Lovelace    Encounter Date: 03/23/2024  Check In:  Session Check In - 03/23/24 0944       Check-In   Supervising physician immediately available to respond to emergencies See telemetry face sheet for immediately available ER MD    Location [facility]-Cardiac & Pulmonary Rehab    Staff Present Burnard Davenport RN,BSN,MPA;Maxon Conetta BS, Exercise Physiologist;Joseph Rolinda NORWOOD HARMAN Cecilie Delores, BS, RRT, CPFT    Virtual Visit No    Medication changes reported     No    Fall or balance concerns reported    No    Tobacco Cessation No Change    Warm-up and Cool-down Performed on first and last piece of equipment    Resistance Training Performed Yes    VAD Patient? No    PAD/SET Patient? No      Pain Assessment   Currently in Pain? No/denies             Tobacco Use History[1]  Goals Met:  Independence with exercise equipment Exercise tolerated well No report of concerns or symptoms today Strength training completed today  Goals Unmet:  Not Applicable  Comments: Pt able to follow exercise prescription today without complaint.  Will continue to monitor for progression.    Dr. Oneil Pinal is Medical Director for [facility] Cardiac Rehabilitation.  Dr. Fuad Aleskerov is Medical Director for [facility] Pulmonary Rehabilitation.    [1]  Social History Tobacco Use  Smoking Status Never  Smokeless Tobacco Never

## 2024-03-25 ENCOUNTER — Encounter

## 2024-03-25 DIAGNOSIS — Z955 Presence of coronary angioplasty implant and graft: Secondary | ICD-10-CM

## 2024-03-25 DIAGNOSIS — Z48812 Encounter for surgical aftercare following surgery on the circulatory system: Secondary | ICD-10-CM | POA: Diagnosis not present

## 2024-03-25 NOTE — Progress Notes (Signed)
 Daily Session Note  Patient Details  Name: Cheyenne Lopez MRN: [id number] Date of Birth: 1950/12/10 Referring Provider:   Flowsheet Row Cardiac Rehab from 03/03/2024 in [facility] Cardiac and Pulmonary Rehab  Referring Provider Dr. Cara Lovelace    Encounter Date: 03/25/2024  Check In:  Session Check In - 03/25/24 0924       Check-In   Supervising physician immediately available to respond to emergencies See telemetry face sheet for immediately available ER MD    Location [facility]-Cardiac & Pulmonary Rehab    Staff Present Burnard Davenport RN,BSN,MPA;Joseph [facility] RCP,RRT,BSRT;Laura Cates RN,BSN;Margaret Best, MS, Exercise Physiologist;Noah Tickle, BS, Exercise Physiologist    Virtual Visit No    Medication changes reported     No    Fall or balance concerns reported    No    Tobacco Cessation No Change    Warm-up and Cool-down Performed on first and last piece of equipment    Resistance Training Performed Yes    VAD Patient? No    PAD/SET Patient? No      Pain Assessment   Currently in Pain? No/denies             Tobacco Use History[1]  Goals Met:  Independence with exercise equipment Exercise tolerated well No report of concerns or symptoms today Strength training completed today  Goals Unmet:  Not Applicable  Comments: Pt able to follow exercise prescription today without complaint.  Will continue to monitor for progression.    Dr. Oneil Pinal is Medical Director for [facility] Cardiac Rehabilitation.  Dr. Fuad Aleskerov is Medical Director for [facility] Pulmonary Rehabilitation.    [1]  Social History Tobacco Use  Smoking Status Never  Smokeless Tobacco Never

## 2024-03-30 ENCOUNTER — Encounter: Attending: Internal Medicine | Admitting: *Deleted

## 2024-03-30 DIAGNOSIS — Z955 Presence of coronary angioplasty implant and graft: Secondary | ICD-10-CM | POA: Insufficient documentation

## 2024-03-30 DIAGNOSIS — Z48812 Encounter for surgical aftercare following surgery on the circulatory system: Secondary | ICD-10-CM | POA: Insufficient documentation

## 2024-03-30 NOTE — Progress Notes (Signed)
 Daily Session Note  Patient Details  Name: Cheyenne Lopez MRN: [id number] Date of Birth: [birth year] Referring Provider:   Flowsheet Row Cardiac Rehab from 03/03/2024 in [facility] Cardiac and Pulmonary Rehab  Referring Provider Dr. Cara Lovelace    Encounter Date: 03/30/2024  Check In:  Session Check In - 03/30/24 0927       Check-In   Supervising physician immediately available to respond to emergencies See telemetry face sheet for immediately available ER MD    Location [facility]-Cardiac & Pulmonary Rehab    Staff Present Othel Durand, RN, BSN, CCRP;Joseph Hood RCP,RRT,BSRT;Maxon [place] BS, Exercise Physiologist;Kelly Bluelinx, ACSM CEP, Exercise Physiologist    Virtual Visit No    Medication changes reported     No    Fall or balance concerns reported    No    Warm-up and Cool-down Performed on first and last piece of equipment    Resistance Training Performed Yes    VAD Patient? No    PAD/SET Patient? No      Pain Assessment   Currently in Pain? No/denies             Tobacco Use History[1]  Goals Met:  Independence with exercise equipment Exercise tolerated well No report of concerns or symptoms today  Goals Unmet:  Not Applicable  Comments: Pt able to follow exercise prescription today without complaint.  Will continue to monitor for progression.    Dr. Oneil Pinal is Medical Director for [facility] Cardiac Rehabilitation.  Dr. Fuad Aleskerov is Medical Director for [facility] Pulmonary Rehabilitation.    [1]  Social History Tobacco Use  Smoking Status Never  Smokeless Tobacco Never

## 2024-04-01 ENCOUNTER — Encounter: Admitting: Emergency Medicine

## 2024-04-01 DIAGNOSIS — Z955 Presence of coronary angioplasty implant and graft: Secondary | ICD-10-CM

## 2024-04-01 NOTE — Progress Notes (Signed)
 Daily Session Note  Patient Details  Name: Cheyenne Lopez MRN: [id number] Date of Birth: [birth year] Referring Provider:   Flowsheet Row Cardiac Rehab from 03/03/2024 in [facility] Cardiac and Pulmonary Rehab  Referring Provider Dr. Cara Lovelace    Encounter Date: 04/01/2024  Check In:  Session Check In - 04/01/24 0948       Check-In   Supervising physician immediately available to respond to emergencies See telemetry face sheet for immediately available ER MD    Location [facility]-Cardiac & Pulmonary Rehab    Staff Present Leita Franks RN,BSN;Joseph [facility] BS, Exercise Physiologist;Margaret Best, MS, Exercise Physiologist    Virtual Visit No    Medication changes reported     No    Fall or balance concerns reported    No    Tobacco Cessation No Change    Warm-up and Cool-down Performed on first and last piece of equipment    Resistance Training Performed Yes    VAD Patient? No    PAD/SET Patient? No      Pain Assessment   Currently in Pain? No/denies             Tobacco Use History[1]  Goals Met:  Independence with exercise equipment Exercise tolerated well No report of concerns or symptoms today Strength training completed today  Goals Unmet:  Not Applicable  Comments: Pt able to follow exercise prescription today without complaint.  Will continue to monitor for progression.    Dr. Oneil Pinal is Medical Director for [facility] Cardiac Rehabilitation.  Dr. Fuad Aleskerov is Medical Director for [facility] Pulmonary Rehabilitation.    [1]  Social History Tobacco Use  Smoking Status Never  Smokeless Tobacco Never

## 2024-04-06 ENCOUNTER — Encounter

## 2024-04-06 ENCOUNTER — Ambulatory Visit: Attending: Obstetrics and Gynecology

## 2024-04-06 ENCOUNTER — Other Ambulatory Visit: Payer: Self-pay

## 2024-04-06 DIAGNOSIS — R293 Abnormal posture: Secondary | ICD-10-CM | POA: Insufficient documentation

## 2024-04-06 DIAGNOSIS — M6281 Muscle weakness (generalized): Secondary | ICD-10-CM | POA: Insufficient documentation

## 2024-04-06 DIAGNOSIS — R2689 Other abnormalities of gait and mobility: Secondary | ICD-10-CM | POA: Insufficient documentation

## 2024-04-06 DIAGNOSIS — N3941 Urge incontinence: Secondary | ICD-10-CM | POA: Insufficient documentation

## 2024-04-06 DIAGNOSIS — Z955 Presence of coronary angioplasty implant and graft: Secondary | ICD-10-CM

## 2024-04-06 NOTE — Progress Notes (Signed)
 Daily Session Note  Patient Details  Name: Cheyenne Lopez MRN: [id number] Date of Birth: October 02, 1950 Referring Provider:   Flowsheet Row Cardiac Rehab from 03/03/2024 in [facility] Cardiac and Pulmonary Rehab  Referring Provider Dr. Cara Lovelace    Encounter Date: 04/06/2024  Check In:  Session Check In - 04/06/24 0938       Check-In   Supervising physician immediately available to respond to emergencies See telemetry face sheet for immediately available ER MD    Location [facility]-Cardiac & Pulmonary Rehab    Staff Present Burnard Davenport RN,BSN,MPA;Joseph [facility] RCP,RRT,BSRT;Maxon Burnell BS, Exercise Physiologist;Azie Mcconahy Dyane BS, ACSM CEP, Exercise Physiologist    Virtual Visit No    Medication changes reported     No    Fall or balance concerns reported    No    Tobacco Cessation No Change    Warm-up and Cool-down Performed on first and last piece of equipment    Resistance Training Performed Yes    VAD Patient? No    PAD/SET Patient? No      Pain Assessment   Currently in Pain? No/denies             Tobacco Use History[1]  Goals Met:  Independence with exercise equipment Exercise tolerated well Personal goals reviewed No report of concerns or symptoms today Strength training completed today  Goals Unmet:  Not Applicable  Comments: Pt able to follow exercise prescription today without complaint.  Will continue to monitor for progression.    Dr. Oneil Pinal is Medical Director for [facility] Cardiac Rehabilitation.  Dr. Fuad Aleskerov is Medical Director for [facility] Pulmonary Rehabilitation.     [1]  Social History Tobacco Use  Smoking Status Never  Smokeless Tobacco Never

## 2024-04-06 NOTE — Therapy (Signed)
 " OUTPATIENT PHYSICAL THERAPY FEMALE PELVIC EVALUATION   Patient Name: Cheyenne Lopez MRN: [id number] DOB:May 14, 1950, 74 y.o., female Today's Date: 04/06/2024  END OF SESSION:  PT End of Session - 04/06/24 1029     Visit Number 1    Number of Visits 9    Date for Recertification  06/05/24    Authorization Type Medicare and [employer]    Progress Note Due on Visit 10    PT Start Time 1015    PT Stop Time 1100    PT Time Calculation (min) 45 min    Activity Tolerance Patient tolerated treatment well    Behavior During Therapy WFL for tasks assessed/performed          Past Medical History:  Diagnosis Date   Adenomatous colon polyp    Aortic atherosclerosis    Bilateral ocular hypertension    Carpal tunnel syndrome    Complication of anesthesia    a.) PONV   Coronary artery disease 07/15/2019   a.) LHC/PCI 07/15/2019: 100% pRCA (chronic; unable to cross wire), 40% mLAD, 85% o-pLCx (2.5 x 22 mm Resolute Onyx DES)   CVA (cerebral vascular accident) (HCC)    a.) MRI brain 10/03/2020: small chronic cortical/subcortical infarct within LEFT parietal lobe (postcentral gyrus involved); small chronic infarcts within BILATERAL cerebellar hemispheres   Diastolic dysfunction 07/15/2019   a.) TTE 07/15/2019 (setting of NSTEMI): EF 60-65%, mid and distal lateral wall HK, G1DD; b.) TTE 02/13/2021: EF >55%, triv MR/TR   DM (diabetes mellitus), type 1 (HCC)    DOE (dyspnea on exertion)    GERD (gastroesophageal reflux disease)    Glaucoma    Hypertension    Hypothyroidism    Insulin  pump in place    a.) Omnipod 5   NSTEMI (non-ST elevated myocardial infarction) (HCC) 07/14/2019   a.) troponins were trended: 23 --> 145 --> 11,794 --> 12,794 ng/L; b.) LHC/PCI 07/15/2019: 100% pRCA (chronic; unable to cross wire), 40% mLAD, 85% o-pLCx (2.5 x 22 mm Resolute Onyx DES)   Obstructive sleep apnea on CPAP    PONV (postoperative nausea and vomiting)    Refusal of blood transfusions as patient is  [employer]    Past Surgical History:  Procedure Laterality Date   ANAL FISSURE REPAIR     BLADDER SURGERY     CARPAL TUNNEL RELEASE Right 03/01/2022   Procedure: CARPAL TUNNEL RELEASE;  Surgeon: Cleotilde Barrio, MD;  Location: [facility];  Service: Orthopedics;  Laterality: Right;   COLONOSCOPY     CORONARY PRESSURE/FFR STUDY N/A 12/04/2023   Procedure: CORONARY PRESSURE/FFR STUDY;  Surgeon: Florencio Cara BIRCH, MD;  Location: [facility];  Service: Cardiovascular;  Laterality: N/A;   CORONARY STENT INTERVENTION N/A 07/15/2019   Procedure: CORONARY STENT INTERVENTION;  Surgeon: Florencio Cara BIRCH, MD;  Location: [facility];  Service: Cardiovascular;  Laterality: N/A;  CFX    LEFT HEART CATH AND CORONARY ANGIOGRAPHY N/A 07/15/2019   Procedure: LEFT HEART CATH AND CORONARY ANGIOGRAPHY;  Surgeon: Fernand Denyse LABOR, MD;  Location: [facility];  Service: Cardiovascular;  Laterality: N/A;   LEFT HEART CATH AND CORONARY ANGIOGRAPHY Left 12/04/2023   Procedure: LEFT HEART CATH AND CORONARY ANGIOGRAPHY;  Surgeon: Florencio Cara BIRCH, MD;  Location: [facility];  Service: Cardiovascular;  Laterality: Left;   Patient Active Problem List   Diagnosis Date Noted   S/P angioplasty with stent 02/14/2024   Statin intolerance 11/12/2022   Carpal tunnel syndrome of right wrist 03/16/2022  Bilateral ocular hypertension 06/06/2020   Coronary artery disease involving native coronary artery of native heart without angina pectoris 07/22/2019   Acute lower UTI 07/16/2019   Hypertension complicating diabetes (HCC) 07/16/2019   Type 1 diabetes mellitus without complication 07/16/2019   NSTEMI (non-ST elevated myocardial infarction) (HCC) 07/15/2019   Chest pain 07/14/2019   Lateral epicondylitis 05/01/2016   Transfusion of blood product refused for religious reason 09/29/2014   Obstructive sleep apnea (adult) (pediatric) 08/02/2014   Hypothyroidism 03/02/2011    PCP: Dr.  Eliverto at [facility]  REFERRING PROVIDER: Beverli Dinsmore, MD  REFERRING DIAG: Urge incontinence  THERAPY DIAG:  Urge incontinence  Muscle weakness (generalized)  Other abnormalities of gait and mobility  Abnormal posture  Rationale for Evaluation and Treatment: Rehabilitation  ONSET DATE: 02/10/24 referral date  SUBJECTIVE:                                                                                                                                                                                           SUBJECTIVE STATEMENT: URGE FUNCTION: pt feels like she goes a lot, at least once an hour. Mainly urgency incontinence vs. SUI. Pt denied pain with urination. Stream is strong. Gets up approx. 1-2 nights per week, only once a night as she voids before bed. Bladder surgery in 2006 or 2007 for prolapse.  BOWEL FUNCTION: pt reported seeing fecal matter in underwear once but it is not a problem. Pt has a hx of anal fissure repair. She has hx of constipation. Maybe every other day but sometimes twice a day, she uses suppositories and laxatives to stimulate BM. Pt not tracking fiber and does not track fiber. Pt denied hx of hemorrhoids.  SEXUAL FUNCTION: hx of painful intercourse 2/2 dryness, estradiol cream causes cramping and spotting. Pt denied pain with OBGYN exam or tampon insertion prior to menopause (menopause for 10+ years). No issues with climaxing previously but not active right now-for approx. A few years. CORE STABILITY: no hx of of abdominal surgeries, MVAs, falls, or fxs.   Fluid intake: water  with pills, coffee in the morning (one cup), water  (approx. 2-3 (16.7 oz) or more water  bottles per day), a little wine at night (but not since second heart stents placed 01/2024)  FUNCTIONAL LIMITATIONS: she has to use bathroom during cardiac rehab sometimes and when shopping  PERTINENT HISTORY:  Medications for current condition: none Surgeries: none for UI but hx of bladder surgery  for prolapse 20 years ago Other: S/p coronary stent placement 02/14/2024, not taking all meds since stent placement, estradiol stopped 2/2 crampy, NSTEMI in 2021, HTN, type 1 diabetes, hypothyroidism, OSA with CPAP,  colon polyp, B ocular HTN, CVA found in 2022 MRI, GERD, glaucoma, insulin  pump in place, bladder surgery for prolapse 2006, anal fissure repair before 2005 (pt can't remember) Sexual abuse: No  PAIN:  Are you having pain? No NPRS scale: 0/10  PRECAUTIONS: None  RED FLAGS: None   WEIGHT BEARING RESTRICTIONS: No  FALLS:  Has patient fallen in last 6 months? No  OCCUPATION: retired  ACTIVITY LEVEL : cardiac rehab twice a week, 60 minutes from [phone]; in the summer she walks with her husband and walks treadmill during the winter  PLOF: Independent  PATIENT GOALS: To have a stronger pelvic floor and stop leaking   BOWEL MOVEMENT: Pain with bowel movement: No Type of bowel movement:Frequency every other day Fully empty rectum: Yes:   Leakage: No      but did see a streak in underwear                                            Caused by:  Bowel urgency:  Pads: No Fiber supplement/laxative Yes suppository   URINATION: Pain with urination: No Fully empty bladder: Yes:                                           Post-void dribble: No Stream: Strong Urgency: Yes  Frequency:during the day at least once an hour                                                        Nocturia: Yes: 1-2 nights a week   Leakage: Urge to void and Walking to the bathroom Pads/briefs: No more like a dribble vs. Gush   INTERCOURSE:  Ability to have vaginal penetration No  Pain with intercourse: Initial Penetration, During Penetration, and Deep Penetration Dryness: Yes  Climax: had no issues when she was active Marinoff Scale: 0/3   PREGNANCY: Number of pregnancies: 3 Vaginal deliveries: 3 Tearing No Episiotomy Yes  C-section deliveries 0 Currently pregnant  No  PROLAPSE: None   OBJECTIVE:  Note: Objective measures were completed at Evaluation unless otherwise noted.   COGNITION: Overall cognitive status: Within functional limits for tasks assessed     SENSATION: Light touch: Appears intact   FUNCTIONAL TESTS:   Single leg stance:  Rt: WNL  Lt: WNL Sit-up test: Squat: Bed mobility:  GAIT: Assistive device utilized: None Comments: decr. Trunk rot and stride length  POSTURE: forward head, increased lumbar lordosis, and posterior pelvic tilt   LUMBARAROM/PROM: All WNL except for limited B trunk rot, no pain reported.  A/PROM A/PROM  Eval (% available)  Flexion   Extension   Right lateral flexion   Left lateral flexion   Right rotation   Left rotation    (Blank rows = not tested)  LOWER EXTREMITY ROM:  Active ROM Right eval Left eval  Hip flexion    Hip extension    Hip abduction    Hip adduction    Hip internal rotation    Hip external rotation    Knee flexion    Knee extension    Ankle dorsiflexion  Ankle plantarflexion    Ankle inversion    Ankle eversion     (Blank rows = not tested)  LOWER EXTREMITY MMT:  MMT Right eval Left eval  Hip flexion    Hip extension    Hip abduction    Hip adduction    Hip internal rotation    Hip external rotation    Knee flexion    Knee extension    Ankle dorsiflexion    Ankle plantarflexion    Ankle inversion    Ankle eversion     (Blank rows = not tested) PALPATION:  General: No TTP over spine, hip in standing.   PELVIC MMT:   MMT eval  Vaginal   Internal Anal Sphincter   External Anal Sphincter   Puborectalis   (Blank rows = not tested)        TONE: limited by time constraints   PROLAPSE: limited by time constraints   TODAY'S TREATMENT:                                                                                                                              DATE: 04/06/24  EVAL   SELF CARE:  PATIENT EDUCATION:  Education  details: PT educated pt on main functions of the pelvic floor, IAP, breath and PFM relationship. PT discussed POC, frequency and duration. PT provided the following education: TOILET POSTURE: Urination: feet flat, lean forward with forearms on legs to fully empty bladder. Bowel movement: place feet flat on Squatty Potty or stool so knees are higher than hips, lean forward to relax pelvic floor in order to avoid strain.  SHOES: wear supportive shoes, and sandals with straps.  POSTURE: try not to cross legs at knees or ankles. Try the figure four stretch instead.  WATER : start with water  first thing in the morning.   PELVIC TILTS: try to stand in neutral, not tucking your tail and not arching back, but in the middle. Bladder diary:   ~Urinating every 2-3 hours ~Urine stream for at least 8 seconds. ~Urine color should be light, lemonade  ~If you have the urge, perform deep breathing for 5 reps and then 5-10 reps of pelvic floor contraction.  ~When do you feel the urge? ~When do you leak?  Bowel diary: ~How often do you have a BM and consipation? Person educated: Patient Education method: Explanation, Demonstration, and Handouts Education comprehension: verbalized understanding, returned demonstration, and needs further education  HOME EXERCISE PROGRAM: Not yet established  ASSESSMENT:  CLINICAL IMPRESSION: Patient is a pleasant 73 y.o. female who was seen today for physical therapy evaluation and treatment for urge incontinence.  Pt's PMH is significant for the following: S/p coronary stent placement 02/14/2024, not taking all meds since stent placement, estradiol stopped 2/2 crampy, NSTEMI in 2021, HTN, type 1 diabetes, hypothyroidism, OSA with CPAP, colon polyp, B ocular HTN, CVA found in 2022 MRI, GERD, glaucoma, insulin  pump in place, bladder surgery for prolapse 2006, anal  fissure repair before 2005 (pt can't remember exact date). The following impairments were noted upon exam:  limited ROM (trunk rot), , postural dysfunction (incr. Lx lordosis, PPT, FHP), decr. Strength likely 2/2 subjective reports and gait deviations, nocturia, urgency UI. Pt would benefit from skilled PT to improve safety and decr. Pain during all ADLs.   OBJECTIVE IMPAIRMENTS: Abnormal gait, cardiopulmonary status limiting activity, decreased coordination, decreased endurance, decreased mobility, decreased ROM, decreased strength, hypomobility, increased fascial restrictions, and postural dysfunction.   ACTIVITY LIMITATIONS: transfers, continence, toileting, and locomotion level  PARTICIPATION LIMITATIONS: meal prep, cleaning, laundry, interpersonal relationship, shopping, and community activity  PERSONAL FACTORS: Age, Past/current experiences, Time since onset of injury/illness/exacerbation, and 3+ comorbidities: see above are also affecting patient's functional outcome.   REHAB POTENTIAL: Good  CLINICAL DECISION MAKING: Stable/uncomplicated  EVALUATION COMPLEXITY: Low   GOALS: Goals reviewed with patient? Yes  SHORT TERM GOALS: Target date: for all STGs: 05/04/24  Pt will be IND in HEP to improve pain, strength, coordination. Baseline: cardiac rehab twice a week and walks treadmill in winter and outdoors in spring/summer Goal status: INITIAL  2.  Finish exam and write goals as indicated. Baseline: limited by time constraints Goal status: INITIAL  3.  Pt will demo proper toileting posture to fully empty bladder and reduce straining during bowel movement. Baseline: unable to demo Goal status: INITIAL  4.  Pt will demonstrated improved relaxation and contraction of PFM with coordination of breath to reduce urinary leakage to </=twice/week. Baseline: unsure but has urgency UI  Goal status: INITIAL    LONG TERM GOALS: Target date: for all LTGs: 06/01/24  Pt will demonstrated improved relaxation and contraction of PFM with coordination of breath to reduce urinary leakage to  </=once/week. Baseline: unsure but has urgency UI  Goal status: INITIAL  2.   Pt will incr. Water  intake to >/=64 oz. Per day and incr. Fiber intake to >/=25g to reduce constipation. Baseline: unsure of fiber intake or water  intake, maybe 2-3 bottles per day or more Goal status: INITIAL  3.  Pt will demonstrate improved relaxation and contraction of pelvic floor muscles (PFM) with coordination of breath to decr. Pain with intercourse with spouse. Baseline: not currently sexually active 2/2 dryness and discomfort Goal status: INITIAL  PLAN: finish exam (palpation, ROM, MMT, DR) Establish HEP.   PT FREQUENCY: 1x/week  PT DURATION: 8 weeks  PLANNED INTERVENTIONS: 97164- PT Re-evaluation, 97110-Therapeutic exercises, 97530- Therapeutic activity, 97112- Neuromuscular re-education, 97535- Self Care, [id number]- Manual therapy, [phone]- Gait training, [phone] (1-2 muscles), 20561 (3+ muscles)- Dry Needling, Patient/Family education, Joint mobilization, Spinal mobilization, Moist heat, and Biofeedback    Rolando Hessling L, PT 04/06/2024, 10:30 AM  Delon Pinal, PT,DPT 04/06/2024 10:30 AM Phone: [phone] Fax: [phone]  "

## 2024-04-06 NOTE — Patient Instructions (Signed)
 TOILET POSTURE: Urination: feet flat, lean forward with forearms on legs to fully empty bladder. Bowel movement: place feet flat on Squatty Potty or stool so knees are higher than hips, lean forward to relax pelvic floor in order to avoid strain.  SHOES: wear supportive shoes, and sandals with straps.  POSTURE: try not to cross legs at knees or ankles. Try the figure four stretch instead.  WATER: start with water first thing in the morning.   PELVIC TILTS: try to stand in neutral, not tucking your tail and not arching back, but in the middle.  Bladder diary:   ~Urinating every 2-3 hours ~Urine stream for at least 8 seconds. ~Urine color should be light, lemonade  ~If you have the urge, perform deep breathing for 5 reps and then 5-10 reps of pelvic floor contraction.  ~When do you feel the urge? ~When do you leak?

## 2024-04-08 ENCOUNTER — Encounter: Payer: Self-pay | Admitting: *Deleted

## 2024-04-08 ENCOUNTER — Encounter: Admitting: Emergency Medicine

## 2024-04-08 DIAGNOSIS — Z955 Presence of coronary angioplasty implant and graft: Secondary | ICD-10-CM

## 2024-04-08 NOTE — Progress Notes (Signed)
 Cardiac Individual Treatment Plan  Patient Details  Name: Cheyenne Lopez MRN: [id number] Date of Birth: 05/15/1950 Referring Provider:   Flowsheet Row Cardiac Rehab from 03/03/2024 in [facility] Cardiac and Pulmonary Rehab  Referring Provider Dr. Cara Lovelace    Initial Encounter Date:  Flowsheet Row Cardiac Rehab from 03/03/2024 in [facility] Cardiac and Pulmonary Rehab  Date 03/03/24    Visit Diagnosis: Status post coronary artery stent placement  Patient's Home Medications on Admission: Current Medications[1]  Past Medical History: Past Medical History:  Diagnosis Date   Adenomatous colon polyp    Aortic atherosclerosis    Bilateral ocular hypertension    Carpal tunnel syndrome    Complication of anesthesia    a.) PONV   Coronary artery disease 07/15/2019   a.) LHC/PCI 07/15/2019: 100% pRCA (chronic; unable to cross wire), 40% mLAD, 85% o-pLCx (2.5 x 22 mm Resolute Onyx DES)   CVA (cerebral vascular accident) (HCC)    a.) MRI brain 10/03/2020: small chronic cortical/subcortical infarct within LEFT parietal lobe (postcentral gyrus involved); small chronic infarcts within BILATERAL cerebellar hemispheres   Diastolic dysfunction 07/15/2019   a.) TTE 07/15/2019 (setting of NSTEMI): EF 60-65%, mid and distal lateral wall HK, G1DD; b.) TTE 02/13/2021: EF >55%, triv MR/TR   DM (diabetes mellitus), type 1 (HCC)    DOE (dyspnea on exertion)    GERD (gastroesophageal reflux disease)    Glaucoma    Hypertension    Hypothyroidism    Insulin  pump in place    a.) Omnipod 5   NSTEMI (non-ST elevated myocardial infarction) (HCC) 07/14/2019   a.) troponins were trended: 23 --> 145 --> 11,794 --> 12,794 ng/L; b.) LHC/PCI 07/15/2019: 100% pRCA (chronic; unable to cross wire), 40% mLAD, 85% o-pLCx (2.5 x 22 mm Resolute Onyx DES)   Obstructive sleep apnea on CPAP    PONV (postoperative nausea and vomiting)    Refusal of blood transfusions as patient is Jehovah's Witness     Tobacco  Use: Tobacco Use History[2]  Labs: Review Flowsheet       Latest Ref Rng & Units 07/16/2019  Labs for ITP Cardiac and Pulmonary Rehab  Cholestrol 0 - 200 mg/dL 864   LDL (calc) 0 - 99 mg/dL 79   HDL-C >59 mg/dL 45   Trlycerides <849 mg/dL 55   Hemoglobin J8r 4.8 - 5.6 % 6.9      Exercise Target Goals: Exercise Program Goal: Individual exercise prescription set using results from initial 6 min walk test and THRR while considering  patients activity barriers and safety.   Exercise Prescription Goal: Initial exercise prescription builds to 30-45 minutes a day of aerobic activity, 2-3 days per week.  Home exercise guidelines will be given to patient during program as part of exercise prescription that the participant will acknowledge.   Education: Aerobic Exercise: - Group verbal and visual presentation on the components of exercise prescription. Introduces F.I.T.T principle from ACSM for exercise prescriptions.  Reviews F.I.T.T. principles of aerobic exercise including progression. Written material provided at class time. Flowsheet Row Cardiac Rehab from 04/08/2024 in [facility] Cardiac and Pulmonary Rehab  Date 03/16/24  Educator mb  Instruction Review Code 1- Verbalizes Understanding    Education: Resistance Exercise: - Group verbal and visual presentation on the components of exercise prescription. Introduces F.I.T.T principle from ACSM for exercise prescriptions  Reviews F.I.T.T. principles of resistance exercise including progression. Written material provided at class time.    Education: Exercise & Equipment Safety: - Individual verbal instruction and demonstration of equipment  use and safety with use of the equipment. Flowsheet Row Cardiac Rehab from 04/08/2024 in [facility] Cardiac and Pulmonary Rehab  Date 03/03/24  Educator [facility]  Instruction Review Code 1- Verbalizes Understanding    Education: Exercise Physiology & General Exercise Guidelines: - Group verbal and written  instruction with models to review the exercise physiology of the cardiovascular system and associated critical values. Provides general exercise guidelines with specific guidelines to those with heart or lung disease. Written material provided at class time.   Education: Flexibility, Balance, Mind/Body Relaxation: - Group verbal and visual presentation with interactive activity on the components of exercise prescription. Introduces F.I.T.T principle from ACSM for exercise prescriptions. Reviews F.I.T.T. principles of flexibility and balance exercise training including progression. Also discusses the mind body connection.  Reviews various relaxation techniques to help reduce and manage stress (i.e. Deep breathing, progressive muscle relaxation, and visualization). Balance handout provided to take home. Written material provided at class time.   Activity Barriers & Risk Stratification:  Activity Barriers & Cardiac Risk Stratification - 03/03/24 1529       Activity Barriers & Cardiac Risk Stratification   Activity Barriers Joint Problems;Shortness of Breath   Left shoulder   Cardiac Risk Stratification High          6 Minute Walk:  6 Minute Walk     Row Name 03/03/24 1527         6 Minute Walk   Phase Initial     Distance 1435 feet     Walk Time 6 minutes     # of Rest Breaks 0     MPH 2.7     METS 2.8     RPE 7     Perceived Dyspnea  0     VO2 Peak 9.77     Symptoms No     Resting HR 57 bpm     Resting BP 124/62     Resting Oxygen Saturation  99 %     Exercise Oxygen Saturation  during 6 min walk 96 %     Max Ex. HR 80 bpm     Max Ex. BP 126/58     2 Minute Post BP 116/62        Oxygen Initial Assessment:   Oxygen Re-Evaluation:   Oxygen Discharge (Final Oxygen Re-Evaluation):   Initial Exercise Prescription:  Initial Exercise Prescription - 03/03/24 1500       Date of Initial Exercise RX and Referring Provider   Date 03/03/24    Referring Provider Dr.  Cara Lovelace      Oxygen   Maintain Oxygen Saturation 88% or higher      Treadmill   MPH 2.7    Grade 0    Minutes 15    METs 2.8      Recumbant Bike   Level 3    RPM 50    Watts 25    Minutes 15    METs 2.8      NuStep   Level 3    SPM 80    Minutes 15    METs 2.8      Elliptical   Level 1    Speed 3    Minutes 15    METs 2.8      Prescription Details   Duration Progress to 30 minutes of continuous aerobic without signs/symptoms of physical distress      Intensity   THRR 40-80% of Max Heartrate 93-129    Ratings of Perceived Exertion  11-13    Perceived Dyspnea 0-4      Progression   Progression Continue to progress workloads to maintain intensity without signs/symptoms of physical distress.      Resistance Training   Training Prescription Yes    Weight 3lb    Reps 10-15          Perform Capillary Blood Glucose checks as needed.  Exercise Prescription Changes:   Exercise Prescription Changes     Row Name 03/03/24 1500 03/25/24 1000           Response to Exercise   Blood Pressure (Admit) 124/62 118/60      Blood Pressure (Exercise) 126/58 126/62      Blood Pressure (Exit) 116/62 104/60      Heart Rate (Admit) 57 bpm 52 bpm      Heart Rate (Exercise) 80 bpm 105 bpm      Heart Rate (Exit) 58 bpm 82 bpm      Oxygen Saturation (Admit) 99 % --      Oxygen Saturation (Exercise) 96 % --      Oxygen Saturation (Exit) 96 % --      Rating of Perceived Exertion (Exercise) 7 17      Perceived Dyspnea (Exercise) 0 0      Symptoms none none      Comments results first 2 weeks of exercise      Duration -- Progress to 30 minutes of  aerobic without signs/symptoms of physical distress      Intensity -- THRR unchanged        Progression   Progression -- Continue to progress workloads to maintain intensity without signs/symptoms of physical distress.      Average METs -- 3.8        Resistance Training   Weight -- 3lb      Reps -- 10-15         Interval Training   Interval Training -- No        Treadmill   MPH -- 3      Grade -- 0.5      Minutes -- 15      METs -- 3.5        Recumbant Bike   Level -- 10      Watts -- 43      Minutes -- 15      METs -- 4.32        Elliptical   Level -- 1      Speed -- 2      Minutes -- 15      METs -- 4.3        Oxygen   Maintain Oxygen Saturation -- 88% or higher         Exercise Comments:   Exercise Comments     Row Name 03/16/24 1008           Exercise Comments First full day of exercise!  Patient was oriented to gym and equipment including functions, settings, policies, and procedures.  Patient's individual exercise prescription and treatment plan were reviewed.  All starting workloads were established based on the results of the 6 minute walk test done at initial orientation visit.  The plan for exercise progression was also introduced and progression will be customized based on patient's performance and goals.          Exercise Goals and Review:   Exercise Goals     Row Name 03/03/24 [phone]  Exercise Goals   Increase Physical Activity Yes       Intervention Provide advice, education, support and counseling about physical activity/exercise needs.;Develop an individualized exercise prescription for aerobic and resistive training based on initial evaluation findings, risk stratification, comorbidities and participant's personal goals.       Expected Outcomes Short Term: Attend rehab on a regular basis to increase amount of physical activity.;Long Term: Add in home exercise to make exercise part of routine and to increase amount of physical activity.;Long Term: Exercising regularly at least 3-5 days a week.       Increase Strength and Stamina Yes       Intervention Provide advice, education, support and counseling about physical activity/exercise needs.;Develop an individualized exercise prescription for aerobic and resistive training based on initial  evaluation findings, risk stratification, comorbidities and participant's personal goals.       Expected Outcomes Short Term: Increase workloads from initial exercise prescription for resistance, speed, and METs.;Short Term: Perform resistance training exercises routinely during rehab and add in resistance training at home;Long Term: Improve cardiorespiratory fitness, muscular endurance and strength as measured by increased METs and functional capacity ( )       Able to understand and use rate of perceived exertion (RPE) scale Yes       Intervention Provide education and explanation on how to use RPE scale       Expected Outcomes Short Term: Able to use RPE daily in rehab to express subjective intensity level;Long Term:  Able to use RPE to guide intensity level when exercising independently       Able to understand and use Dyspnea scale Yes       Intervention Provide education and explanation on how to use Dyspnea scale       Expected Outcomes Short Term: Able to use Dyspnea scale daily in rehab to express subjective sense of shortness of breath during exertion;Long Term: Able to use Dyspnea scale to guide intensity level when exercising independently       Knowledge and understanding of Target Heart Rate Range (THRR) Yes       Intervention Provide education and explanation of THRR including how the numbers were predicted and where they are located for reference       Expected Outcomes Short Term: Able to state/look up THRR;Long Term: Able to use THRR to govern intensity when exercising independently;Short Term: Able to use daily as guideline for intensity in rehab       Able to check pulse independently Yes       Intervention Provide education and demonstration on how to check pulse in carotid and radial arteries.;Review the importance of being able to check your own pulse for safety during independent exercise       Expected Outcomes Short Term: Able to explain why pulse checking is important  during independent exercise;Long Term: Able to check pulse independently and accurately       Understanding of Exercise Prescription Yes       Intervention Provide education, explanation, and written materials on patient's individual exercise prescription       Expected Outcomes Short Term: Able to explain program exercise prescription;Long Term: Able to explain home exercise prescription to exercise independently          Exercise Goals Re-Evaluation :  Exercise Goals Re-Evaluation     Row Name 03/16/24 1008 03/25/24 1054           Exercise Goal Re-Evaluation   Exercise Goals Review Increase Physical Activity;Able  to understand and use rate of perceived exertion (RPE) scale;Knowledge and understanding of Target Heart Rate Range (THRR);Understanding of Exercise Prescription;Increase Strength and Stamina;Able to understand and use Dyspnea scale;Able to check pulse independently Increase Physical Activity;Increase Strength and Stamina;Understanding of Exercise Prescription      Comments Reviewed RPE and dyspnea scale, THR and program prescription with pt today.  Pt voiced understanding and was given a copy of goals to take home. Chelly is off to a good start in the program and was able to attend her first sessions during this review period. During her sessions she was able to use the recumbent bike at level 10, and the treadmill at a speed of and 0.5% incline. We will continue to monitor her progress in the program.      Expected Outcomes Short: Use RPE daily to regulate intensity. Long: Follow program prescription in THR. Short: Continue to follow exercise prescription. Long: Continue exercise to improve strength and stamina.         Discharge Exercise Prescription (Final Exercise Prescription Changes):  Exercise Prescription Changes - 03/25/24 1000       Response to Exercise   Blood Pressure (Admit) 118/60    Blood Pressure (Exercise) 126/62    Blood Pressure (Exit) 104/60    Heart  Rate (Admit) 52 bpm    Heart Rate (Exercise) 105 bpm    Heart Rate (Exit) 82 bpm    Rating of Perceived Exertion (Exercise) 17    Perceived Dyspnea (Exercise) 0    Symptoms none    Comments first 2 weeks of exercise    Duration Progress to 30 minutes of  aerobic without signs/symptoms of physical distress    Intensity THRR unchanged      Progression   Progression Continue to progress workloads to maintain intensity without signs/symptoms of physical distress.    Average METs 3.8      Resistance Training   Weight 3lb    Reps 10-15      Interval Training   Interval Training No      Treadmill   MPH 3    Grade 0.5    Minutes 15    METs 3.5      Recumbant Bike   Level 10    Watts 43    Minutes 15    METs 4.32      Elliptical   Level 1    Speed 2    Minutes 15    METs 4.3      Oxygen   Maintain Oxygen Saturation 88% or higher          Nutrition:  Target Goals: Understanding of nutrition guidelines, daily intake of sodium 1500mg , cholesterol 200mg , calories 30% from fat and 7% or less from saturated fats, daily to have 5 or more servings of fruits and vegetables.  Education: Nutrition 1 -Group instruction provided by verbal, written material, interactive activities, discussions, models, and posters to present general guidelines for heart healthy nutrition including macronutrients, label reading, and promoting whole foods over processed counterparts. Education serves as pensions consultant of discussion of heart healthy eating for all. Written material provided at class time. Flowsheet Row Cardiac Rehab from 04/08/2024 in [facility] Cardiac and Pulmonary Rehab  Date 03/25/24  Educator jg  Instruction Review Code 1- Verbalizes Understanding     Education: Nutrition 2 -Group instruction provided by verbal, written material, interactive activities, discussions, models, and posters to present general guidelines for heart healthy nutrition including sodium, cholesterol, and saturated  fat. Providing  guidance of habit forming to improve blood pressure, cholesterol, and body weight. Written material provided at class time.     Biometrics:  Pre Biometrics - 03/03/24 1532       Pre Biometrics   Height 5' 0.5 (1.537 m)    Weight 129 lb 14.4 oz (58.9 kg)    Waist Circumference 31 inches    Hip Circumference 38 inches    Waist to Hip Ratio 0.82 %    BMI (Calculated) 24.94    Single Leg Stand 28.81 seconds           Nutrition Therapy Plan and Nutrition Goals:   Nutrition Assessments:  MEDIFICTS Score Key: >=70 Need to make dietary changes  40-70 Heart Healthy Diet <= 40 Therapeutic Level Cholesterol Diet  Flowsheet Row Cardiac Rehab from 03/16/2024 in [facility] Cardiac and Pulmonary Rehab  Picture Your Plate Total Score on Admission 61   Picture Your Plate Scores: <59 Unhealthy dietary pattern with much room for improvement. 41-50 Dietary pattern unlikely to meet recommendations for good health and room for improvement. 51-60 More healthful dietary pattern, with some room for improvement.  >60 Healthy dietary pattern, although there may be some specific behaviors that could be improved.    Nutrition Goals Re-Evaluation:  Nutrition Goals Re-Evaluation     Row Name 04/06/24 1007             Goals   Comment Almas would like to meet with RD. An appointment was scheduled for her on 1/28 to meet one on one with our program RD.       Expected Outcome Short: meet with RD to set nutrition goals. Long: maintain nutrition goals set with RD.          Nutrition Goals Discharge (Final Nutrition Goals Re-Evaluation):  Nutrition Goals Re-Evaluation - 04/06/24 1007       Goals   Comment Almas would like to meet with RD. An appointment was scheduled for her on 1/28 to meet one on one with our program RD.    Expected Outcome Short: meet with RD to set nutrition goals. Long: maintain nutrition goals set with RD.          Psychosocial: Target Goals:  Acknowledge presence or absence of significant depression and/or stress, maximize coping skills, provide positive support system. Participant is able to verbalize types and ability to use techniques and skills needed for reducing stress and depression.   Education: Stress, Anxiety, and Depression - Group verbal and visual presentation to define topics covered.  Reviews how body is impacted by stress, anxiety, and depression.  Also discusses healthy ways to reduce stress and to treat/manage anxiety and depression. Written material provided at class time.   Education: Sleep Hygiene -Provides group verbal and written instruction about how sleep can affect your health.  Define sleep hygiene, discuss sleep cycles and impact of sleep habits. Review good sleep hygiene tips.   Initial Review & Psychosocial Screening:  Initial Psych Review & Screening - 03/03/24 1525       Initial Review   Current issues with Current Sleep Concerns      Family Dynamics   Good Support System? Yes      Barriers   Psychosocial barriers to participate in program There are no identifiable barriers or psychosocial needs.      Screening Interventions   Interventions Encouraged to exercise;Provide feedback about the scores to participant;To provide support and resources with identified psychosocial needs    Expected Outcomes Short Term goal:  Utilizing psychosocial counselor, staff and physician to assist with identification of specific Stressors or current issues interfering with healing process. Setting desired goal for each stressor or current issue identified.;Short Term goal: Identification and review with participant of any Quality of Life or Depression concerns found by scoring the questionnaire.;Long Term Goal: Stressors or current issues are controlled or eliminated.;Long Term goal: The participant improves quality of Life and PHQ9 Scores as seen by post scores and/or verbalization of changes          Quality  of Life Scores:   Quality of Life - 03/04/24 0730       Quality of Life   Select Quality of Life      Quality of Life Scores   Health/Function Pre 26.63 %    Socioeconomic Pre 28.13 %    Psych/Spiritual Pre 29.14 %    Family Pre 30 %    GLOBAL Pre 27.96 %         Scores of 19 and below usually indicate a poorer quality of life in these areas.  A difference of  2-3 points is a clinically meaningful difference.  A difference of 2-3 points in the total score of the Quality of Life Index has been associated with significant improvement in overall quality of life, self-image, physical symptoms, and general health in studies assessing change in quality of life.  PHQ-9: Review Flowsheet       03/03/2024 08/14/2019 07/30/2019  Depression screen PHQ 2/9  Decreased Interest 0 0 0  Down, Depressed, Hopeless 0 0 0  PHQ - 2 Score 0 0 0  Altered sleeping 1 0 1  Tired, decreased energy 0 0 3  Change in appetite 0 0 0  Feeling bad or failure about yourself  0 0 0  Trouble concentrating 1 0 1  Moving slowly or fidgety/restless 0 0 0  Suicidal thoughts 0 0 0  PHQ-9 Score 2 0  5   Difficult doing work/chores Not difficult at all - Somewhat difficult    Details       Data saved with a previous flowsheet row definition        Interpretation of Total Score  Total Score Depression Severity:  1-4 = Minimal depression, 5-9 = Mild depression, 10-14 = Moderate depression, 15-19 = Moderately severe depression, 20-27 = Severe depression   Psychosocial Evaluation and Intervention:  Psychosocial Evaluation - 03/03/24 1550       Psychosocial Evaluation & Interventions   Interventions Relaxation education;Encouraged to exercise with the program and follow exercise prescription    Comments Jalei is coming to cardiac rehab after a stent placement. She states she does not have any stress concerns at this time. She has had sleep issues for a while which Trazodone usually helps with, but with all her  new medication she has been hesitant to take it. Encouraged to talk to provider or pharmacist about restarting her sleep medicine and other supplements she wants to restart. She really enjoys caring for her plants and spending time with her grandkids doing crafts. She states she has a good support system and is looking forward to starting in the program    Expected Outcomes Short: attend cardiac rehab for education and exercise Long: develop and maintain positive self care habits    Continue Psychosocial Services  Follow up required by staff          Psychosocial Re-Evaluation:  Psychosocial Re-Evaluation     Row Name 04/06/24 1012  Psychosocial Re-Evaluation   Current issues with Current Sleep Concerns       Comments Cheyanne reports that she does not sleep well and that she used to take trazadone to help her sleep, which seemed to work. She is unsure now with all her new medicaitons if she can still take this. She has not been taking it currently. She was advised to talk with her doctor to determine if it is still safe to take this medication to help with sleep. No other mental health concerns reported.       Expected Outcomes Short: talk to doctor about starting trazadone again. Long: get into a better sleep pattern.       Interventions Encouraged to attend Cardiac Rehabilitation for the exercise       Continue Psychosocial Services  Follow up required by staff          Psychosocial Discharge (Final Psychosocial Re-Evaluation):  Psychosocial Re-Evaluation - 04/06/24 1012       Psychosocial Re-Evaluation   Current issues with Current Sleep Concerns    Comments Kymiah reports that she does not sleep well and that she used to take trazadone to help her sleep, which seemed to work. She is unsure now with all her new medicaitons if she can still take this. She has not been taking it currently. She was advised to talk with her doctor to determine if it is still safe to take this  medication to help with sleep. No other mental health concerns reported.    Expected Outcomes Short: talk to doctor about starting trazadone again. Long: get into a better sleep pattern.    Interventions Encouraged to attend Cardiac Rehabilitation for the exercise    Continue Psychosocial Services  Follow up required by staff          Vocational Rehabilitation: Provide vocational rehab assistance to qualifying candidates.   Vocational Rehab Evaluation & Intervention:  Vocational Rehab - 03/03/24 1351       Initial Vocational Rehab Evaluation & Intervention   Assessment shows need for Vocational Rehabilitation No          Education: Education Goals: Education classes will be provided on a variety of topics geared toward better understanding of heart health and risk factor modification. Participant will state understanding/return demonstration of topics presented as noted by education test scores.  Learning Barriers/Preferences:  Learning Barriers/Preferences - 03/03/24 1351       Learning Barriers/Preferences   Learning Barriers None    Learning Preferences None          General Cardiac Education Topics:  AED/CPR: - Group verbal and written instruction with the use of models to demonstrate the basic use of the AED with the basic ABC's of resuscitation.   Test and Procedures: - Group verbal and visual presentation and models provide information about basic cardiac anatomy and function. Reviews the testing methods done to diagnose heart disease and the outcomes of the test results. Describes the treatment choices: Medical Management, Angioplasty, or Coronary Bypass Surgery for treating various heart conditions including Myocardial Infarction, Angina, Valve Disease, and Cardiac Arrhythmias. Written material provided at class time.   Medication Safety: - Group verbal and visual instruction to review commonly prescribed medications for heart and lung disease. Reviews the  medication, class of the drug, and side effects. Includes the steps to properly store meds and maintain the prescription regimen. Written material provided at class time. Flowsheet Row Cardiac Rehab from 04/08/2024 in [facility] Cardiac and Pulmonary Rehab  Date 04/08/24  Educator kb  Instruction Review Code 1- Verbalizes Understanding    Intimacy: - Group verbal instruction through game format to discuss how heart and lung disease can affect sexual intimacy. Written material provided at class time. Flowsheet Row Cardiac Rehab from 04/08/2024 in [facility] Cardiac and Pulmonary Rehab  Date 03/16/24  Educator mb  Instruction Review Code 1- Verbalizes Understanding    Know Your Numbers and Heart Failure: - Group verbal and visual instruction to discuss disease risk factors for cardiac and pulmonary disease and treatment options.  Reviews associated critical values for Overweight/Obesity, Hypertension, Cholesterol, and Diabetes.  Discusses basics of heart failure: signs/symptoms and treatments.  Introduces Heart Failure Zone chart for action plan for heart failure. Written material provided at class time.   Infection Prevention: - Provides verbal and written material to individual with discussion of infection control including proper hand washing and proper equipment cleaning during exercise session. Flowsheet Row Cardiac Rehab from 04/08/2024 in [facility] Cardiac and Pulmonary Rehab  Date 03/03/24  Educator [facility]  Instruction Review Code 1- Verbalizes Understanding    Falls Prevention: - Provides verbal and written material to individual with discussion of falls prevention and safety. Flowsheet Row Cardiac Rehab from 04/08/2024 in [facility] Cardiac and Pulmonary Rehab  Date 03/03/24  Educator [facility]  Instruction Review Code 1- Verbalizes Understanding    Other: -Provides group and verbal instruction on various topics (see comments)   Knowledge Questionnaire Score:  Knowledge Questionnaire Score - 03/16/24 1241        Knowledge Questionnaire Score   Pre Score 20/26          Core Components/Risk Factors/Patient Goals at Admission:  Personal Goals and Risk Factors at Admission - 03/03/24 1346       Core Components/Risk Factors/Patient Goals on Admission    Weight Management Yes;Weight Loss    Goal Weight: Long Term 120 lb (54.4 kg)    Expected Outcomes Short Term: Continue to assess and modify interventions until short term weight is achieved;Weight Loss: Understanding of general recommendations for a balanced deficit meal plan, which promotes 1-2 lb weight loss per week and includes a negative energy balance of [phone] kcal/d;Understanding recommendations for meals to include 15-35% energy as protein, 25-35% energy from fat, 35-60% energy from carbohydrates, less than 200mg  of dietary cholesterol, 20-35 gm of total fiber daily;Understanding of distribution of calorie intake throughout the day with the consumption of 4-5 meals/snacks    Diabetes Yes    Intervention Provide education about signs/symptoms and action to take for hypo/hyperglycemia.;Provide education about proper nutrition, including hydration, and aerobic/resistive exercise prescription along with prescribed medications to achieve blood glucose in normal ranges: Fasting glucose 65-99 mg/dL    Expected Outcomes Short Term: Participant verbalizes understanding of the signs/symptoms and immediate care of hyper/hypoglycemia, proper foot care and importance of medication, aerobic/resistive exercise and nutrition plan for blood glucose control.;Long Term: Attainment of HbA1C < 7%.    Hypertension Yes    Intervention Provide education on lifestyle modifcations including regular physical activity/exercise, weight management, moderate sodium restriction and increased consumption of fresh fruit, vegetables, and low fat dairy, alcohol moderation, and smoking cessation.;Monitor prescription use compliance.    Expected Outcomes Short Term: Continued  assessment and intervention until BP is < 140/32mm HG in hypertensive participants. < 130/26mm HG in hypertensive participants with diabetes, heart failure or chronic kidney disease.;Long Term: Maintenance of blood pressure at goal levels.    Lipids Yes    Intervention Provide education and support for participant  on nutrition & aerobic/resistive exercise along with prescribed medications to achieve LDL 70mg , HDL >40mg .    Expected Outcomes Short Term: Participant states understanding of desired cholesterol values and is compliant with medications prescribed. Participant is following exercise prescription and nutrition guidelines.;Long Term: Cholesterol controlled with medications as prescribed, with individualized exercise RX and with personalized nutrition plan. Value goals: LDL < 70mg , HDL > 40 mg.          Education:Diabetes - Individual verbal and written instruction to review signs/symptoms of diabetes, desired ranges of glucose level fasting, after meals and with exercise. Acknowledge that pre and post exercise glucose checks will be done for 3 sessions at entry of program. Flowsheet Row Cardiac Rehab from 04/08/2024 in [facility] Cardiac and Pulmonary Rehab  Date 03/03/24  Educator [facility]  Instruction Review Code 1- Verbalizes Understanding    Core Components/Risk Factors/Patient Goals Review:   Goals and Risk Factor Review     Row Name 03/03/24 1544 04/06/24 1008           Core Components/Risk Factors/Patient Goals Review   Personal Goals Review -- Diabetes;Hypertension;Lipids      Review -- Emiley reports that she takes all her medications for cholesterol, blood pressure, and diabetes. She checks her blood sugars at home and follows up her doctor for routine lab work to monitor risk factors. She does own a BP cuff but does not routinely check blood pressure at home. She was encouraged to start checking BP at home several days a week to get into the habit of checking it.      Expected  Outcomes -- Short: start checking BP at home. Long: control cardiac risk factors.         Core Components/Risk Factors/Patient Goals at Discharge (Final Review):   Goals and Risk Factor Review - 04/06/24 1008       Core Components/Risk Factors/Patient Goals Review   Personal Goals Review Diabetes;Hypertension;Lipids    Review Renise reports that she takes all her medications for cholesterol, blood pressure, and diabetes. She checks her blood sugars at home and follows up her doctor for routine lab work to monitor risk factors. She does own a BP cuff but does not routinely check blood pressure at home. She was encouraged to start checking BP at home several days a week to get into the habit of checking it.    Expected Outcomes Short: start checking BP at home. Long: control cardiac risk factors.          ITP Comments:  ITP Comments     Row Name 03/03/24 1511 03/11/24 0938 03/16/24 1008 04/08/24 1145     ITP Comments Completed program orientation and . Initial ITP created and sent for review to Medical Director. 30 Day review completed. Medical Director ITP review done, changes made as directed, and signed approval by Medical Director. First full day of exercise!  Patient was oriented to gym and equipment including functions, settings, policies, and procedures.  Patient's individual exercise prescription and treatment plan were reviewed.  All starting workloads were established based on the results of the 6 minute walk test done at initial orientation visit.  The plan for exercise progression was also introduced and progression will be customized based on patient's performance and goals. 30 Day review completed. Medical Director ITP review done, changes made as directed, and signed approval by Medical Director.       Comments: 30 day review     [1]  Current Outpatient Medications:    aspirin   81 MG chewable tablet, Chew 1 tablet (81 mg total) by mouth daily., Disp: 30 tablet, Rfl: 0    Calcium -Magnesium-Vitamin D (CALCIUM  MAGNESIUM PO), Take 1 tablet by mouth daily at 6 (six) AM., Disp: , Rfl:    chlorhexidine  (PERIDEX ) 0.12 % solution, 30 mLs by Mouth Rinse route See admin instructions. Rinse as needed for mouth sores., Disp: , Rfl:    clopidogrel (PLAVIX) 75 MG tablet, Take 75 mg by mouth., Disp: , Rfl:    Cobalamin Combinations (VITAMIN B12-FOLIC ACID PO), Take 1 Dose by mouth daily at 6 (six) AM., Disp: , Rfl:    estradiol (ESTRACE) 0.01 % CREA vaginal cream, Place 1 g vaginally., Disp: , Rfl:    Glucagon (BAQSIMI ONE PACK) 3 MG/DOSE POWD, , Disp: , Rfl:    Glucagon (GVOKE HYPOPEN 2-PACK) 1 MG/0.2ML SOAJ, Inject 1 mg into the skin as needed (hypoglycemia)., Disp: , Rfl:    ibuprofen (ADVIL) 200 MG tablet, Take 400 mg by mouth every 6 (six) hours as needed., Disp: , Rfl:    Insulin  Disposable Pump (OMNIPOD 5 [id number] PODS GEN 5) MISC, Inject 1 Device into the skin See admin instructions., Disp: , Rfl:    Insulin  Human (INSULIN  PUMP) SOLN, Inject into the skin. Omnipod, Disp: , Rfl:    insulin  lispro (HUMALOG) 100 UNIT/ML injection, Inject into the skin See admin instructions., Disp: , Rfl:    isosorbide  mononitrate (IMDUR ) 30 MG 24 hr tablet, Take 0.5 tablets (15 mg total) by mouth daily., Disp: 15 tablet, Rfl: 1   L-THEANINE PO, Take by mouth See admin instructions. Two dropperfuls by mouth at bedtime., Disp: , Rfl:    levothyroxine (SYNTHROID) 75 MCG tablet, Take 75 mcg by mouth daily before breakfast., Disp: , Rfl:    liothyronine (CYTOMEL) 5 MCG tablet, Take 5 mcg by mouth daily before breakfast., Disp: , Rfl:    Multiple Vitamin (MULTIVITAMIN) capsule, Take 1 capsule by mouth daily., Disp: , Rfl:    nitroGLYCERIN  (NITROSTAT ) 0.4 MG SL tablet, Place 1 tablet (0.4 mg total) under the tongue every 5 (five) minutes as needed for chest pain., Disp: 9 tablet, Rfl: 12   Travoprost, BAK Free, (TRAVATAN) 0.004 % SOLN ophthalmic solution, Place 1 drop into both eyes at bedtime.,  Disp: , Rfl:    traZODone (DESYREL) 50 MG tablet, Take 25 mg by mouth at bedtime as needed for sleep., Disp: , Rfl:    valsartan (DIOVAN) 40 MG tablet, Take 20 mg by mouth every morning., Disp: , Rfl: 11 [2]  Social History Tobacco Use  Smoking Status Never  Smokeless Tobacco Never

## 2024-04-08 NOTE — Progress Notes (Signed)
 Daily Session Note  Patient Details  Name: Cheyenne Lopez MRN: [id number] Date of Birth: 06/19/1950 Referring Provider:   Flowsheet Row Cardiac Rehab from 03/03/2024 in [facility] Cardiac and Pulmonary Rehab  Referring Provider Dr. Cara Lovelace    Encounter Date: 04/08/2024  Check In:  Session Check In - 04/08/24 0927       Check-In   Supervising physician immediately available to respond to emergencies See telemetry face sheet for immediately available ER MD    Location [facility]-Cardiac & Pulmonary Rehab    Staff Present Leita Franks RN,BSN;[facility] BS, Exercise Physiologist;Margaret Best, MS, Exercise Physiologist    Virtual Visit No    Medication changes reported     No    Fall or balance concerns reported    No    Tobacco Cessation No Change    Warm-up and Cool-down Performed on first and last piece of equipment    Resistance Training Performed Yes    VAD Patient? No    PAD/SET Patient? No      Pain Assessment   Currently in Pain? No/denies             Tobacco Use History[1]  Goals Met:  Independence with exercise equipment Exercise tolerated well No report of concerns or symptoms today Strength training completed today  Goals Unmet:  Not Applicable  Comments: Pt able to follow exercise prescription today without complaint.  Will continue to monitor for progression.    Dr. Oneil Pinal is Medical Director for [facility] Cardiac Rehabilitation.  Dr. Fuad Aleskerov is Medical Director for [facility] Pulmonary Rehabilitation.    [1]  Social History Tobacco Use  Smoking Status Never  Smokeless Tobacco Never

## 2024-04-13 ENCOUNTER — Ambulatory Visit

## 2024-04-13 ENCOUNTER — Other Ambulatory Visit: Payer: Self-pay

## 2024-04-13 ENCOUNTER — Encounter

## 2024-04-13 DIAGNOSIS — Z955 Presence of coronary angioplasty implant and graft: Secondary | ICD-10-CM

## 2024-04-13 DIAGNOSIS — R2689 Other abnormalities of gait and mobility: Secondary | ICD-10-CM

## 2024-04-13 DIAGNOSIS — M6281 Muscle weakness (generalized): Secondary | ICD-10-CM

## 2024-04-13 DIAGNOSIS — R293 Abnormal posture: Secondary | ICD-10-CM

## 2024-04-13 DIAGNOSIS — N3941 Urge incontinence: Secondary | ICD-10-CM

## 2024-04-13 NOTE — Progress Notes (Signed)
 Daily Session Note  Patient Details  Name: Cheyenne Lopez MRN: [id number] Date of Birth: [birth year] Referring Provider:   Flowsheet Row Cardiac Rehab from 03/03/2024 in [facility] Cardiac and Pulmonary Rehab  Referring Provider Dr. Cara Lovelace    Encounter Date: 04/13/2024  Check In:  Session Check In - 04/13/24 0925       Check-In   Supervising physician immediately available to respond to emergencies See telemetry face sheet for immediately available ER MD    Location [facility]-Cardiac & Pulmonary Rehab    Staff Present Burnard Davenport RN,BSN,MPA;Maxon Burnell BS, Exercise Physiologist;[facility] BS, ACSM CEP, Exercise Physiologist    Virtual Visit No    Medication changes reported     No    Fall or balance concerns reported    No    Tobacco Cessation No Change    Warm-up and Cool-down Performed on first and last piece of equipment    Resistance Training Performed Yes    VAD Patient? No    PAD/SET Patient? No      Pain Assessment   Currently in Pain? No/denies             Tobacco Use History[1]  Goals Met:  Independence with exercise equipment Exercise tolerated well No report of concerns or symptoms today Strength training completed today  Goals Unmet:  Not Applicable  Comments: Pt able to follow exercise prescription today without complaint.  Will continue to monitor for progression.    Dr. Oneil Pinal is Medical Director for [facility] Cardiac Rehabilitation.  Dr. Fuad Aleskerov is Medical Director for [facility] Pulmonary Rehabilitation.    [1]  Social History Tobacco Use  Smoking Status Never  Smokeless Tobacco Never

## 2024-04-13 NOTE — Therapy (Signed)
 " OUTPATIENT PHYSICAL THERAPY FEMALE PELVIC TREATMENT    Patient Name: Cheyenne Lopez MRN: [id number] DOB:1951-02-04, 74 y.o., female Today's Date: 04/13/2024  END OF SESSION:  PT End of Session - 04/13/24 1026     Visit Number 2    Number of Visits 9    Date for Recertification  06/05/24    Authorization Type Medicare and [employer]    Progress Note Due on Visit 10    PT Start Time 1022    PT Stop Time 1102    PT Time Calculation (min) 40 min    Activity Tolerance Patient tolerated treatment well    Behavior During Therapy WFL for tasks assessed/performed          Past Medical History:  Diagnosis Date   Adenomatous colon polyp    Aortic atherosclerosis    Bilateral ocular hypertension    Carpal tunnel syndrome    Complication of anesthesia    a.) PONV   Coronary artery disease 07/15/2019   a.) LHC/PCI 07/15/2019: 100% pRCA (chronic; unable to cross wire), 40% mLAD, 85% o-pLCx (2.5 x 22 mm Resolute Onyx DES)   CVA (cerebral vascular accident) (HCC)    a.) MRI brain 10/03/2020: small chronic cortical/subcortical infarct within LEFT parietal lobe (postcentral gyrus involved); small chronic infarcts within BILATERAL cerebellar hemispheres   Diastolic dysfunction 07/15/2019   a.) TTE 07/15/2019 (setting of NSTEMI): EF 60-65%, mid and distal lateral wall HK, G1DD; b.) TTE 02/13/2021: EF >55%, triv MR/TR   DM (diabetes mellitus), type 1 (HCC)    DOE (dyspnea on exertion)    GERD (gastroesophageal reflux disease)    Glaucoma    Hypertension    Hypothyroidism    Insulin  pump in place    a.) Omnipod 5   NSTEMI (non-ST elevated myocardial infarction) (HCC) 07/14/2019   a.) troponins were trended: 23 --> 145 --> 11,794 --> 12,794 ng/L; b.) LHC/PCI 07/15/2019: 100% pRCA (chronic; unable to cross wire), 40% mLAD, 85% o-pLCx (2.5 x 22 mm Resolute Onyx DES)   Obstructive sleep apnea on CPAP    PONV (postoperative nausea and vomiting)    Refusal of blood transfusions as patient is  [employer]    Past Surgical History:  Procedure Laterality Date   ANAL FISSURE REPAIR     BLADDER SURGERY     CARPAL TUNNEL RELEASE Right 03/01/2022   Procedure: CARPAL TUNNEL RELEASE;  Surgeon: Cleotilde Barrio, MD;  Location: [facility];  Service: Orthopedics;  Laterality: Right;   COLONOSCOPY     CORONARY PRESSURE/FFR STUDY N/A 12/04/2023   Procedure: CORONARY PRESSURE/FFR STUDY;  Surgeon: Florencio Cara BIRCH, MD;  Location: [facility];  Service: Cardiovascular;  Laterality: N/A;   CORONARY STENT INTERVENTION N/A 07/15/2019   Procedure: CORONARY STENT INTERVENTION;  Surgeon: Florencio Cara BIRCH, MD;  Location: [facility];  Service: Cardiovascular;  Laterality: N/A;  CFX    LEFT HEART CATH AND CORONARY ANGIOGRAPHY N/A 07/15/2019   Procedure: LEFT HEART CATH AND CORONARY ANGIOGRAPHY;  Surgeon: Fernand Denyse LABOR, MD;  Location: [facility];  Service: Cardiovascular;  Laterality: N/A;   LEFT HEART CATH AND CORONARY ANGIOGRAPHY Left 12/04/2023   Procedure: LEFT HEART CATH AND CORONARY ANGIOGRAPHY;  Surgeon: Florencio Cara BIRCH, MD;  Location: [facility];  Service: Cardiovascular;  Laterality: Left;   Patient Active Problem List   Diagnosis Date Noted   S/P angioplasty with stent 02/14/2024   Statin intolerance 11/12/2022   Carpal tunnel syndrome of right wrist  03/16/2022   Bilateral ocular hypertension 06/06/2020   Coronary artery disease involving native coronary artery of native heart without angina pectoris 07/22/2019   Acute lower UTI 07/16/2019   Hypertension complicating diabetes (HCC) 07/16/2019   Type 1 diabetes mellitus without complication 07/16/2019   NSTEMI (non-ST elevated myocardial infarction) (HCC) 07/15/2019   Chest pain 07/14/2019   Lateral epicondylitis 05/01/2016   Transfusion of blood product refused for religious reason 09/29/2014   Obstructive sleep apnea (adult) (pediatric) 08/02/2014   Hypothyroidism 03/02/2011    PCP: Dr.  Eliverto at [facility]  REFERRING PROVIDER: Beverli Dinsmore, MD  REFERRING DIAG: Urge incontinence  THERAPY DIAG:  Urge incontinence  Muscle weakness (generalized)  Other abnormalities of gait and mobility  Abnormal posture  Rationale for Evaluation and Treatment: Rehabilitation  ONSET DATE: 02/10/24 referral date  SUBJECTIVE:                                                                                                                                                                                           SUBJECTIVE STATEMENT: Pt reported everything has been about the same. She is using a stool for BMs and proper urination posture. She noticed she was able to wait two hours in b/t voids vs. Every hour.   EVAL: URGE FUNCTION: pt feels like she goes a lot, at least once an hour. Mainly urgency incontinence vs. SUI. Pt denied pain with urination. Stream is strong. Gets up approx. 1-2 nights per week, only once a night as she voids before bed. Bladder surgery in 2006 or 2007 for prolapse.  BOWEL FUNCTION: pt reported seeing fecal matter in underwear once but it is not a problem. Pt has a hx of anal fissure repair. She has hx of constipation. Maybe every other day but sometimes twice a day, she uses suppositories and laxatives to stimulate BM. Pt not tracking fiber and does not track fiber. Pt denied hx of hemorrhoids.  SEXUAL FUNCTION: hx of painful intercourse 2/2 dryness, estradiol cream causes cramping and spotting. Pt denied pain with OBGYN exam or tampon insertion prior to menopause (menopause for 10+ years). No issues with climaxing previously but not active right now-for approx. A few years. CORE STABILITY: no hx of of abdominal surgeries, MVAs, falls, or fxs.   Fluid intake: water  with pills, coffee in the morning (one cup), water  (approx. 2-3 (16.7 oz) or more water  bottles per day), a little wine at night (but not since second heart stents placed 01/2024)  FUNCTIONAL  LIMITATIONS: she has to use bathroom during cardiac rehab sometimes and when shopping  PERTINENT HISTORY:  Medications for current condition: none Surgeries: none  for UI but hx of bladder surgery for prolapse 20 years ago Other: S/p coronary stent placement 02/14/2024, not taking all meds since stent placement, estradiol stopped 2/2 crampy, NSTEMI in 2021, HTN, type 1 diabetes, hypothyroidism, OSA with CPAP, colon polyp, B ocular HTN, CVA found in 2022 MRI, GERD, glaucoma, insulin  pump in place, bladder surgery for prolapse 2006, anal fissure repair before 2005 (pt can't remember) Sexual abuse: No  PAIN:  Are you having pain? No 04/13/24 NPRS scale: 0/10  PRECAUTIONS: None  RED FLAGS: None   WEIGHT BEARING RESTRICTIONS: No  FALLS:  Has patient fallen in last 6 months? No  OCCUPATION: retired  ACTIVITY LEVEL : cardiac rehab twice a week, 60 minutes from [phone]; in the summer she walks with her husband and walks treadmill during the winter  PLOF: Independent  PATIENT GOALS: To have a stronger pelvic floor and stop leaking   BOWEL MOVEMENT: Pain with bowel movement: No Type of bowel movement:Frequency every other day Fully empty rectum: Yes:   Leakage: No      but did see a streak in underwear                                            Caused by:  Bowel urgency:  Pads: No Fiber supplement/laxative Yes suppository   URINATION: Pain with urination: No Fully empty bladder: Yes:                                           Post-void dribble: No Stream: Strong Urgency: Yes  Frequency:during the day at least once an hour                                                        Nocturia: Yes: 1-2 nights a week   Leakage: Urge to void and Walking to the bathroom Pads/briefs: No more like a dribble vs. Gush   INTERCOURSE:  Ability to have vaginal penetration No  Pain with intercourse: Initial Penetration, During Penetration, and Deep Penetration Dryness: Yes  Climax: had no  issues when she was active Marinoff Scale: 0/3   PREGNANCY: Number of pregnancies: 3 Vaginal deliveries: 3 Tearing No Episiotomy Yes  C-section deliveries 0 Currently pregnant No  PROLAPSE: None   OBJECTIVE:  Note: Objective measures were completed at Evaluation unless otherwise noted.   COGNITION: Overall cognitive status: Within functional limits for tasks assessed     SENSATION: Light touch: Appears intact   FUNCTIONAL TESTS:   Single leg stance:  Rt: WNL  Lt: WNL Sit-up test: Squat: Bed mobility:  GAIT: Assistive device utilized: None Comments: decr. Trunk rot and stride length  POSTURE: forward head, increased lumbar lordosis, and posterior pelvic tilt   LUMBARAROM/PROM: All WNL except for limited B trunk rot, no pain reported.  A/PROM A/PROM  Eval (% available)  Flexion   Extension   Right lateral flexion   Left lateral flexion   Right rotation   Left rotation    (Blank rows = not tested)  LOWER EXTREMITY ROM: all WNL except for limited B hip IR/ER (R>L)  Active ROM  Right eval Left eval  Hip flexion    Hip extension    Hip abduction    Hip adduction    Hip internal rotation    Hip external rotation    Knee flexion    Knee extension    Ankle dorsiflexion    Ankle plantarflexion    Ankle inversion    Ankle eversion     (Blank rows = not tested)  LOWER EXTREMITY MMT:  MMT Right eval Left eval  Hip flexion 4 4  Hip extension    Hip abduction 3+ 3+  Hip adduction 3+ 3+  Hip internal rotation limited range 4 4  Hip external rotation limited range 4 4  Knee flexion 3+ 3+  Knee extension 5 5  Ankle dorsiflexion 5 5  Ankle plantarflexion    Ankle inversion    Ankle eversion     (Blank rows = not tested) PALPATION:  General: No TTP over spine, hip in standing.  1/19: No TTP over spine, hips, glutes, hamstrings, or calves. no DR noted, hypomobility of tx spine, limited B hip IR/ER, hip weakness. decr. Lat/post rib translation  during diaphragmatic breathing.    PELVIC MMT:   MMT eval  Vaginal   Internal Anal Sphincter   External Anal Sphincter   Puborectalis   (Blank rows = not tested)        TONE: WNL   PROLAPSE: None reported   TODAY'S TREATMENT:                                                                                                                              DATE: 04/13/24   Physical function test: PT completed exam (palpation, MMT, DR, ROM). See above for details.   NMR:  Access Code: [email or web address] URL: [email or web address] Date: 04/13/2024 Prepared by: Delon Pinal  Exercises - Supine Angels  - 1 x daily - 7 x weekly - 1 sets - 10 reps - Sidelying Diaphragmatic Breathing  - 1 x daily - 7 x weekly - 1 sets - 5 reps - Sidelying Open Book  - 1 x daily - 7 x weekly - 1 sets - 10 reps Cues and demo for proper technique. S for safety. No pain reported end of session but slight concordant L rotator cuff pain hx of rotator cuff issues.  SELF CARE:  PATIENT EDUCATION:  Education details: PT educated on fiber food sources and exam findings and established HEP. Person educated: Patient Education method: Explanation, Demonstration, and Handouts Education comprehension: verbalized understanding, returned demonstration, and needs further education  HOME EXERCISE PROGRAM: [email or web address]  ASSESSMENT:  CLINICAL IMPRESSION: Skilled session focused on completing exam (difficulty coordinating breath with PFM contraction without glute activation, decr. Lat/post Rib translation with inhale, no TTP  and hypomobility of tx spine and B hips). Pt progressed to performing HEP IND after cues and demo. The following impairments were noted upon exam: limited ROM (trunk rot), postural dysfunction (incr. Lx lordosis, PPT, FHP),  decr. Strength , nocturia, urgency UI. Pt would benefit from skilled PT to improve safety and decr. Pain during all ADLs.   OBJECTIVE IMPAIRMENTS: Abnormal gait,  cardiopulmonary status limiting activity, decreased coordination, decreased endurance, decreased mobility, decreased ROM, decreased strength, hypomobility, increased fascial restrictions, and postural dysfunction.   ACTIVITY LIMITATIONS: transfers, continence, toileting, and locomotion level  PARTICIPATION LIMITATIONS: meal prep, cleaning, laundry, interpersonal relationship, shopping, and community activity  PERSONAL FACTORS: Age, Past/current experiences, Time since onset of injury/illness/exacerbation, and 3+ comorbidities: see above are also affecting patient's functional outcome.   REHAB POTENTIAL: Good  CLINICAL DECISION MAKING: Stable/uncomplicated  EVALUATION COMPLEXITY: Low   GOALS: Goals reviewed with patient? Yes  SHORT TERM GOALS: Target date: for all STGs: 05/04/24  Pt will be IND in HEP to improve pain, strength, coordination. Baseline: cardiac rehab twice a week and walks treadmill in winter and outdoors in spring/summer Goal status: INITIAL  2.  Finish exam and write goals as indicated. Baseline: limited by time constraints Goal status: MET  3.  Pt will demo proper toileting posture to fully empty bladder and reduce straining during bowel movement. Baseline: unable to demo Goal status: INITIAL  4.  Pt will demonstrated improved relaxation and contraction of PFM with coordination of breath to reduce urinary leakage to </=twice/week. Baseline: unsure but has urgency UI  Goal status: INITIAL    LONG TERM GOALS: Target date: for all LTGs: 06/01/24  Pt will demonstrated improved relaxation and contraction of PFM with coordination of breath to reduce urinary leakage to </=once/week. Baseline: unsure but has urgency UI  Goal status: INITIAL  2.   Pt will incr. Water  intake to >/=64 oz. Per day and incr. Fiber intake to >/=25g to reduce constipation. Baseline: unsure of fiber intake or water  intake, maybe 2-3 bottles per day or more Goal status: INITIAL  3.  Pt  will demonstrate improved relaxation and contraction of pelvic floor muscles (PFM) with coordination of breath to decr. Pain with intercourse with spouse. Baseline: not currently sexually active 2/2 dryness and discomfort Goal status: INITIAL  PLAN: review HEP, thread the needle for L rotator cuff issues?, hip mobility   PT FREQUENCY: 1x/week  PT DURATION: 8 weeks  PLANNED INTERVENTIONS: 97164- PT Re-evaluation, 97110-Therapeutic exercises, 97530- Therapeutic activity, 97112- Neuromuscular re-education, 97535- Self Care, [id number]- Manual therapy, [id number]- Gait training, [phone] (1-2 muscles), 20561 (3+ muscles)- Dry Needling, Patient/Family education, Joint mobilization, Spinal mobilization, Moist heat, and Biofeedback    Londan Coplen L, PT 04/13/2024, 11:06 AM  Delon Pinal, PT,DPT 04/13/24 11:06 AM Phone: [phone] Fax: [phone]  "

## 2024-04-15 ENCOUNTER — Encounter

## 2024-04-20 ENCOUNTER — Encounter

## 2024-04-20 ENCOUNTER — Ambulatory Visit

## 2024-04-22 ENCOUNTER — Encounter

## 2024-04-27 ENCOUNTER — Encounter

## 2024-04-27 ENCOUNTER — Ambulatory Visit

## 2024-04-29 ENCOUNTER — Encounter: Admitting: Emergency Medicine

## 2024-04-29 DIAGNOSIS — Z955 Presence of coronary angioplasty implant and graft: Secondary | ICD-10-CM

## 2024-04-29 NOTE — Progress Notes (Signed)
 Daily Session Note  Patient Details  Name: Cheyenne Lopez MRN: [id number] Date of Birth: 12-22-1950 Referring Provider:   Flowsheet Row Cardiac Rehab from 03/03/2024 in [facility]  Referring Provider Dr. Cara Lovelace    Encounter Date: 04/29/2024  Check In:  Session Check In - 04/29/24 1023       Check-In   Supervising physician immediately available to respond to emergencies See telemetry face sheet for immediately available ER MD    Location [facility]    Staff Present Leita Franks RN,BSN;Maxon Conetta BS, Exercise Physiologist;Joseph Rolinda RCP,RRT,BSRT;Margaret Best, MS, Exercise Physiologist    Virtual Visit No    Medication changes reported     No    Fall or balance concerns reported    No    Tobacco Cessation No Change    Warm-up and Cool-down Performed on first and last piece of equipment    Resistance Training Performed Yes    VAD Patient? No    PAD/SET Patient? No      Pain Assessment   Currently in Pain? No/denies             Tobacco Use History[1]  Goals Met:  Independence with exercise equipment Exercise tolerated well No report of concerns or symptoms today Strength training completed today  Goals Unmet:  Not Applicable  Comments: Pt able to follow exercise prescription today without complaint.  Will continue to monitor for progression.    Dr. Oneil Pinal is Medical Director for [facility] Card[facility].  Dr. Fuad Aleskerov is Medical Director for [facility].    [1]  Social History Tobacco Use  Smoking Status Never  Smokeless Tobacco Never

## 2024-05-01 ENCOUNTER — Encounter

## 2024-05-04 ENCOUNTER — Ambulatory Visit

## 2024-05-04 ENCOUNTER — Encounter

## 2024-05-06 ENCOUNTER — Encounter

## 2024-05-11 ENCOUNTER — Encounter

## 2024-05-11 ENCOUNTER — Ambulatory Visit

## 2024-05-13 ENCOUNTER — Encounter

## 2024-05-18 ENCOUNTER — Encounter

## 2024-05-18 ENCOUNTER — Ambulatory Visit

## 2024-05-20 ENCOUNTER — Encounter

## 2024-05-25 ENCOUNTER — Encounter

## 2024-05-25 ENCOUNTER — Ambulatory Visit

## 2024-05-27 ENCOUNTER — Encounter

## 2024-06-01 ENCOUNTER — Encounter

## 2024-06-01 ENCOUNTER — Ambulatory Visit

## 2024-06-03 ENCOUNTER — Encounter

## 2024-06-08 ENCOUNTER — Encounter

## 2024-06-08 ENCOUNTER — Ambulatory Visit

## 2024-06-10 ENCOUNTER — Encounter

## 2024-06-15 ENCOUNTER — Ambulatory Visit

## 2024-06-15 ENCOUNTER — Encounter

## 2024-06-17 ENCOUNTER — Encounter

## 2024-06-22 ENCOUNTER — Encounter

## 2024-06-22 ENCOUNTER — Ambulatory Visit

## 2024-06-24 ENCOUNTER — Encounter

## 2024-06-29 ENCOUNTER — Ambulatory Visit

## 2024-06-29 ENCOUNTER — Encounter

## 2024-07-01 ENCOUNTER — Encounter
